# Patient Record
Sex: Female | Born: 1955 | Race: White | Hispanic: No | Marital: Single | Smoking: Former smoker
Health system: Southern US, Community
[De-identification: ages and names within clinical notes are randomized; demographics above are authoritative.]

## PROBLEM LIST (undated history)

## (undated) DIAGNOSIS — F909 Attention-deficit hyperactivity disorder, unspecified type: Secondary | ICD-10-CM

## (undated) DIAGNOSIS — C7931 Secondary malignant neoplasm of brain: Secondary | ICD-10-CM

## (undated) DIAGNOSIS — Z9221 Personal history of antineoplastic chemotherapy: Secondary | ICD-10-CM

## (undated) DIAGNOSIS — N6019 Diffuse cystic mastopathy of unspecified breast: Secondary | ICD-10-CM

## (undated) DIAGNOSIS — C50912 Malignant neoplasm of unspecified site of left female breast: Secondary | ICD-10-CM

## (undated) DIAGNOSIS — Z923 Personal history of irradiation: Secondary | ICD-10-CM

## (undated) DIAGNOSIS — C50919 Malignant neoplasm of unspecified site of unspecified female breast: Secondary | ICD-10-CM

## (undated) DIAGNOSIS — C801 Malignant (primary) neoplasm, unspecified: Secondary | ICD-10-CM

## (undated) HISTORY — PX: APPENDECTOMY: SHX54

## (undated) HISTORY — PX: ANKLE FRACTURE SURGERY: SHX122

## (undated) HISTORY — PX: ORIF WRIST FRACTURE: SHX2133

## (undated) HISTORY — DX: Diffuse cystic mastopathy of unspecified breast: N60.19

## (undated) HISTORY — PX: HUMERUS FRACTURE SURGERY: SHX670

## (undated) HISTORY — DX: Attention-deficit hyperactivity disorder, unspecified type: F90.9

---

## 1984-07-31 HISTORY — PX: TEMPOROMANDIBULAR JOINT SURGERY: SHX35

## 2001-04-30 ENCOUNTER — Other Ambulatory Visit: Admission: RE | Admit: 2001-04-30 | Discharge: 2001-04-30 | Payer: Self-pay | Admitting: Obstetrics and Gynecology

## 2001-12-06 ENCOUNTER — Encounter: Admission: RE | Admit: 2001-12-06 | Discharge: 2001-12-06 | Payer: Self-pay | Admitting: Obstetrics and Gynecology

## 2001-12-06 ENCOUNTER — Encounter: Payer: Self-pay | Admitting: Obstetrics and Gynecology

## 2002-09-30 ENCOUNTER — Encounter: Admission: RE | Admit: 2002-09-30 | Discharge: 2002-09-30 | Payer: Self-pay | Admitting: Surgery

## 2002-09-30 ENCOUNTER — Encounter: Payer: Self-pay | Admitting: Surgery

## 2003-10-08 ENCOUNTER — Ambulatory Visit (HOSPITAL_COMMUNITY): Admission: RE | Admit: 2003-10-08 | Discharge: 2003-10-08 | Payer: Self-pay | Admitting: Surgery

## 2003-10-08 ENCOUNTER — Ambulatory Visit (HOSPITAL_BASED_OUTPATIENT_CLINIC_OR_DEPARTMENT_OTHER): Admission: RE | Admit: 2003-10-08 | Discharge: 2003-10-08 | Payer: Self-pay | Admitting: Surgery

## 2003-10-08 ENCOUNTER — Encounter (INDEPENDENT_AMBULATORY_CARE_PROVIDER_SITE_OTHER): Payer: Self-pay | Admitting: *Deleted

## 2007-04-26 ENCOUNTER — Encounter: Admission: RE | Admit: 2007-04-26 | Discharge: 2007-04-26 | Payer: Self-pay | Admitting: Family Medicine

## 2007-05-01 ENCOUNTER — Encounter: Admission: RE | Admit: 2007-05-01 | Discharge: 2007-05-01 | Payer: Self-pay | Admitting: Family Medicine

## 2008-08-14 ENCOUNTER — Encounter: Admission: RE | Admit: 2008-08-14 | Discharge: 2008-08-14 | Payer: Self-pay | Admitting: Family Medicine

## 2010-08-21 ENCOUNTER — Encounter: Payer: Self-pay | Admitting: Family Medicine

## 2010-12-16 NOTE — Op Note (Signed)
Miranda Woods, Miranda Woods                        ACCOUNT NO.:  [id number]   MEDICAL RECORD NO.:  [id number]                   PATIENT TYPE:  AMB   LOCATION:  DSC                                  FACILITY:  [facility]   PHYSICIAN:  Currie Paris, M.D.           DATE OF BIRTH:  1955-08-27   DATE OF PROCEDURE:  10/08/2003  DATE OF DISCHARGE:                                 OPERATIVE REPORT   VISIT:  [id number].   PREOPERATIVE DIAGNOSIS:  Two pilar cysts, one right, one left occipital  region.   POSTOPERATIVE DIAGNOSIS:  Two pilar cysts, one right, one left occipital  region.   OPERATION:  Excision of two pilar cysts.   SURGEON:  Currie Paris, M.D.   ANESTHESIA:  Local.   CLINICAL HISTORY:  The patient is a 55 year old who has a gradually  enlarging and somewhat symptomatic pilar cysts that she desired to have  removed.  She was actually able to point out a third cyst, but that was  quite tiny and asymptomatic at the present time.   DESCRIPTION OF PROCEDURE:  The patient was seen in the minor procedure room  and the two cysts were identified and the surrounding skin shaved.  They  were both anesthetized with 1% Xylocaine with epi using about 5 mL for each.  Waited about ten minutes for that to take good effect.  The left side of the  cyst was approached first and it was a little bit more sizeable, about 2 cm,  so I did a little elliptical incision and then using sharp and blunt  dissection excised a cyst with the overlying skin.  The incision was closed  with some 3-0 Vicryl and then some Dermabond.   Attention was turned to the one on the left side.  That area was also  prepped with some Betadine.  This was smaller so I made a simple incision  over it and excised it and it came out very nicely.  That skin was also  closed with 3-0 Vicryl and Dermabond.   The third cyst that she pointed out was about 2 mm in size and barely  palpable and I felt it would not be appropriate  for removal at this point in  time.                                               Currie Paris, M.D.    CJS/MEDQ  D:  10/08/2003  T:  10/09/2003  Job:  [id number]

## 2011-04-28 ENCOUNTER — Other Ambulatory Visit: Payer: Self-pay | Admitting: Physician Assistant

## 2011-04-28 DIAGNOSIS — Z1231 Encounter for screening mammogram for malignant neoplasm of breast: Secondary | ICD-10-CM

## 2011-05-26 ENCOUNTER — Ambulatory Visit
Admission: RE | Admit: 2011-05-26 | Discharge: 2011-05-26 | Disposition: A | Discharge status: Home / Self Care | Payer: BC Managed Care – PPO | Source: Ambulatory Visit | Attending: Physician Assistant | Admitting: Physician Assistant

## 2011-05-26 DIAGNOSIS — Z1231 Encounter for screening mammogram for malignant neoplasm of breast: Secondary | ICD-10-CM

## 2011-08-13 ENCOUNTER — Encounter: Payer: Self-pay | Admitting: Physician Assistant

## 2011-08-13 DIAGNOSIS — F909 Attention-deficit hyperactivity disorder, unspecified type: Secondary | ICD-10-CM | POA: Insufficient documentation

## 2011-08-13 DIAGNOSIS — N6019 Diffuse cystic mastopathy of unspecified breast: Secondary | ICD-10-CM | POA: Insufficient documentation

## 2011-08-16 ENCOUNTER — Encounter: Payer: Self-pay | Admitting: Physician Assistant

## 2011-09-18 ENCOUNTER — Telehealth: Payer: Self-pay

## 2011-09-18 NOTE — Telephone Encounter (Signed)
Pt last seen for this 04/27/11. Last RFs 08/14/11

## 2011-09-18 NOTE — Telephone Encounter (Signed)
.  [facility]     PT REQUESTING CONCERTA,ADDERALL REFILLS    BEST PHONE [phone]

## 2011-09-19 MED ORDER — METHYLPHENIDATE HCL ER (OSM) 36 MG PO TBCR: 36.0000 mg | EXTENDED_RELEASE_TABLET | ORAL | Status: DC

## 2011-09-19 MED ORDER — AMPHETAMINE-DEXTROAMPHETAMINE 10 MG PO TABS: 10.0000 mg | ORAL_TABLET | Freq: Every day | ORAL | Status: DC

## 2011-09-19 NOTE — Telephone Encounter (Signed)
Written signed at TL desk -- needs ov next month

## 2011-09-19 NOTE — Telephone Encounter (Signed)
Rxs in drawer and pt notified and told needs OV. Pt states she has appt scheduled for March 28.

## 2011-09-28 ENCOUNTER — Ambulatory Visit (INDEPENDENT_AMBULATORY_CARE_PROVIDER_SITE_OTHER): Payer: BC Managed Care – PPO | Admitting: Physician Assistant

## 2011-09-28 ENCOUNTER — Encounter: Payer: Self-pay | Admitting: Physician Assistant

## 2011-09-28 VITALS — BP 127/73 | HR 61 | Temp 98.0°F | Resp 16 | Ht 63.75 in | Wt 119.8 lb

## 2011-09-28 DIAGNOSIS — Z1211 Encounter for screening for malignant neoplasm of colon: Secondary | ICD-10-CM

## 2011-09-28 DIAGNOSIS — F909 Attention-deficit hyperactivity disorder, unspecified type: Secondary | ICD-10-CM

## 2011-09-28 DIAGNOSIS — L989 Disorder of the skin and subcutaneous tissue, unspecified: Secondary | ICD-10-CM

## 2011-09-28 MED ORDER — METHYLPHENIDATE HCL ER (OSM) 36 MG PO TBCR: 36.0000 mg | EXTENDED_RELEASE_TABLET | ORAL | Status: DC

## 2011-09-28 MED ORDER — AMPHETAMINE-DEXTROAMPHETAMINE 10 MG PO TABS: 10.0000 mg | ORAL_TABLET | Freq: Two times a day (BID) | ORAL | Status: DC

## 2011-09-28 NOTE — Progress Notes (Signed)
  Subjective:    Patient ID: Miranda Woods, female    DOB: April 12, 1956, 56 y.o.   MRN: [id number]  HPI "Rash" on forehead, a spot, for a few years. Get flakey, then irritated, then calms, but never resolves.  Had a recent facial and the technician advised she have it evaluated.  Needs a refill of Adderall and Concerta.  Received them recently, attached, but the Adderall wasn't signed.  She's also stopped taking the Concerta and feels like she doing pretty well without it, only using the Adderall prn.  But, on the other hand, sometimes she really struggles to concentrate/focus and so isn't ready to give them up completely.  Did not have colonoscopy after CPE last year. CPE already scheduled for next month.  Review of Systems As above.    Objective:   Physical Exam Vital signs noted. Well-developed, well nourished WF who is awake, alert and oriented, in NAD. HEENT: [place]/AT, PERRL, EOMI.  Sclera and conjunctiva are clear.  EAC are patent, TMs are normal in appearance. Nasal mucosa is pink and moist. OP is clear. Neck: supple, non-tender, no lymphadenopathey, thyromegaly. Heart: RRR, no murmur Lungs: CTA Abdomen: normo-active bowel sounds, supple, non-tender, no mass or organomegaly. Extremities: no cyanosis, clubbing or edema. Skin: warm and dry without rash.  There is an irregular lesion on the right forehead.  Flat.  Very slight hyperpigmentation.  No raised edge.  Well moisturized, but likely rough.     Assessment & Plan:  1. Suspicious lesion-forehead. Refer to Dr. Wayland Denis for biopsy  2. ADHD Controlled with prn stimulant therapy. RF Concerta and Adderall  3. Health Maintenance Proceed with CPE with me next month. Refer for screening colonoscopy.

## 2011-09-28 NOTE — Patient Instructions (Signed)
If you have not heard about the referrals to Dr. Wayland Denis (plastic surgery) and the colonoscopy in 2 weeks, please contact our office.

## 2011-10-06 ENCOUNTER — Encounter: Payer: Self-pay | Admitting: Internal Medicine

## 2011-10-25 ENCOUNTER — Other Ambulatory Visit: Payer: Self-pay

## 2011-10-25 NOTE — Telephone Encounter (Signed)
Patient has an appt scheduled for a CPE with Chelle on Thursday May 2nd but will run out of meds before then. She would like to know if she could get her Concerta and Aderrall refilled.

## 2011-10-26 ENCOUNTER — Encounter: Payer: BC Managed Care – PPO | Admitting: Physician Assistant

## 2011-10-26 ENCOUNTER — Ambulatory Visit: Payer: Self-pay | Admitting: Physician Assistant

## 2011-11-02 ENCOUNTER — Telehealth: Payer: Self-pay | Admitting: *Deleted

## 2011-11-02 ENCOUNTER — Encounter: Payer: Self-pay | Admitting: *Deleted

## 2011-11-02 NOTE — Telephone Encounter (Signed)
Phone call made to patient and message left on voicemail for patient to call the office by 5pm today to reschedule previsit appointment missed today at 3:30pm. Left message stating if previsit appointment not rescheduled today then colonoscopy scheduled for 11-13-11 would be cancelled as well and patient would need to reschedule both appointments./TE

## 2011-11-13 ENCOUNTER — Encounter: Payer: BC Managed Care – PPO | Admitting: Internal Medicine

## 2011-11-14 ENCOUNTER — Encounter: Payer: Self-pay | Admitting: Internal Medicine

## 2011-11-30 ENCOUNTER — Ambulatory Visit (INDEPENDENT_AMBULATORY_CARE_PROVIDER_SITE_OTHER): Payer: BC Managed Care – PPO | Admitting: Physician Assistant

## 2011-11-30 ENCOUNTER — Encounter: Payer: Self-pay | Admitting: Physician Assistant

## 2011-11-30 VITALS — BP 119/80 | HR 57 | Temp 98.0°F | Resp 16 | Ht 63.0 in | Wt 120.0 lb

## 2011-11-30 DIAGNOSIS — F909 Attention-deficit hyperactivity disorder, unspecified type: Secondary | ICD-10-CM

## 2011-11-30 DIAGNOSIS — L659 Nonscarring hair loss, unspecified: Secondary | ICD-10-CM

## 2011-11-30 DIAGNOSIS — Z0000 Encounter for general adult medical examination without abnormal findings: Secondary | ICD-10-CM

## 2011-11-30 LAB — CBC WITH DIFFERENTIAL/PLATELET
Basophils Absolute: 0 10*3/uL (ref 0.0–0.1)
Basophils Relative: 1 % (ref 0–1)
Eosinophils Relative: 3 % (ref 0–5)
HCT: 39.7 % (ref 36.0–46.0)
Lymphocytes Relative: 34 % (ref 12–46)
MCHC: 33.5 g/dL (ref 30.0–36.0)
Monocytes Absolute: 0.5 10*3/uL (ref 0.1–1.0)
Neutro Abs: 2.5 10*3/uL (ref 1.7–7.7)
Platelets: 245 10*3/uL (ref 150–400)
RDW: 12.1 % (ref 11.5–15.5)
WBC: 4.8 10*3/uL (ref 4.0–10.5)

## 2011-11-30 LAB — POCT UA - MICROSCOPIC ONLY
Casts, Ur, LPF, POC: NEGATIVE
Mucus, UA: NEGATIVE
WBC, Ur, HPF, POC: NEGATIVE
Yeast, UA: NEGATIVE

## 2011-11-30 LAB — POCT URINALYSIS DIPSTICK
Bilirubin, UA: NEGATIVE
Blood, UA: NEGATIVE
Leukocytes, UA: NEGATIVE
Nitrite, UA: NEGATIVE
Protein, UA: NEGATIVE
Urobilinogen, UA: 0.2
pH, UA: 7

## 2011-11-30 LAB — COMPREHENSIVE METABOLIC PANEL
ALT: 12 U/L (ref 0–35)
AST: 17 U/L (ref 0–37)
Alkaline Phosphatase: 50 U/L (ref 39–117)
BUN: 13 mg/dL (ref 6–23)
Calcium: 9.4 mg/dL (ref 8.4–10.5)
Creat: 0.48 mg/dL — ABNORMAL LOW (ref 0.50–1.10)
Total Bilirubin: 0.3 mg/dL (ref 0.3–1.2)

## 2011-11-30 LAB — LIPID PANEL
HDL: 88 mg/dL (ref >39)
Total CHOL/HDL Ratio: 2.4 Ratio
VLDL: 21 mg/dL (ref 0–40)

## 2011-11-30 LAB — TSH: TSH: 0.932 u[IU]/mL (ref 0.350–4.500)

## 2011-11-30 MED ORDER — METHYLPHENIDATE HCL ER (OSM) 36 MG PO TBCR: 36.0000 mg | EXTENDED_RELEASE_TABLET | ORAL | Status: DC

## 2011-11-30 MED ORDER — AMPHETAMINE-DEXTROAMPHETAMINE 10 MG PO TABS: 10.0000 mg | ORAL_TABLET | Freq: Two times a day (BID) | ORAL | Status: DC

## 2011-11-30 NOTE — Progress Notes (Signed)
Subjective:    Patient ID: Miranda Woods, female    DOB: 12-06-1955, 56 y.o.   MRN: [id number]  HPI  Patient is here for her annual exam.  She is up to date on her mammogram.  She is scheduled for her first colonoscopy. She has not been taking her ADHD medications, but says that her job is increasingly stressful and would like a refill on her medications.   Patient is not fasting.  Review of Systems  Constitutional: Negative.   HENT: Positive for rhinorrhea (minor seasonal allergy sxs) and sneezing.   Eyes: Negative.   Respiratory: Negative.   Cardiovascular: Negative.   Gastrointestinal: Negative.   Genitourinary: Negative.   Musculoskeletal: Negative.   Neurological: Negative.   Hematological: Negative.   Psychiatric/Behavioral: Negative.          Objective:   Physical Exam  Constitutional: She is oriented to person, place, and time. She appears well-developed and well-nourished.  HENT:  Head: Normocephalic and atraumatic.  Right Ear: External ear normal.  Left Ear: External ear normal.  Nose: Nose normal.  Mouth/Throat: Oropharynx is clear and moist.       R/L tms normal  Eyes: Conjunctivae and EOM are normal. Pupils are equal, round, and reactive to light.  Fundoscopic exam:      The right eye shows no arteriolar narrowing, no AV nicking, no exudate, no hemorrhage and no papilledema. The right eye shows red reflex.      The left eye shows no arteriolar narrowing, no AV nicking, no exudate, no hemorrhage and no papilledema. The left eye shows red reflex. Neck: Normal range of motion. Neck supple. No tracheal deviation present. No thyromegaly present.  Cardiovascular: Normal rate, regular rhythm and normal heart sounds.   Pulmonary/Chest: Effort normal and breath sounds normal. No stridor. No respiratory distress.  Abdominal: Soft. Bowel sounds are normal. She exhibits no distension and no mass. There is no tenderness. There is no guarding.  Genitourinary: Vagina  normal and uterus normal. Guaiac negative stool. No vaginal discharge found.  Musculoskeletal: Normal range of motion. She exhibits no edema and no tenderness.  Lymphadenopathy:    She has no cervical adenopathy.  Neurological: She is alert and oriented to person, place, and time. She has normal reflexes. She displays normal reflexes. No cranial nerve deficit.  Skin: Skin is warm and dry. No rash noted. No erythema. No pallor.    Filed Vitals:   11/30/11 1515  BP: 119/80  Pulse: 57  Temp: 98 F (36.7 C)  Resp: 16   Results for orders placed in visit on 11/30/11  IFOBT (OCCULT BLOOD)      Component Value Range   IFOBT Negative    POCT UA - MICROSCOPIC ONLY      Component Value Range   WBC, Ur, HPF, POC neg     RBC, urine, microscopic neg     Bacteria, U Microscopic neg     Mucus, UA neg     Epithelial cells, urine per micros 0-2     Crystals, Ur, HPF, POC neg     Casts, Ur, LPF, POC neg     Yeast, UA neg    POCT URINALYSIS DIPSTICK      Component Value Range   Color, UA yellow     Clarity, UA clear     Glucose, UA neg     Bilirubin, UA neg     Ketones, UA neg     Spec Grav, UA 1.015  Blood, UA neg     pH, UA 7.0     Protein, UA neg     Urobilinogen, UA 0.2     Nitrite, UA neg     Leukocytes, UA Negative    COMPREHENSIVE METABOLIC PANEL      Component Value Range   Sodium 136  135 - 145 (mEq/L)   Potassium 4.0  3.5 - 5.3 (mEq/L)   Chloride 101  96 - 112 (mEq/L)   CO2 28  19 - 32 (mEq/L)   Glucose, Bld 94  70 - 99 (mg/dL)   BUN 13  6 - 23 (mg/dL)   Creat 7.82 (*) 9.56 - 1.10 (mg/dL)   Total Bilirubin 0.3  0.3 - 1.2 (mg/dL)   Alkaline Phosphatase 50  39 - 117 (U/L)   AST 17  0 - 37 (U/L)   ALT 12  0 - 35 (U/L)   Total Protein 6.7  6.0 - 8.3 (g/dL)   Albumin 4.5  3.5 - 5.2 (g/dL)   Calcium 9.4  8.4 - 21.3 (mg/dL)  LIPID PANEL      Component Value Range   Cholesterol 211 (*) 0 - 200 (mg/dL)   Triglycerides 086  <578 (mg/dL)   HDL 88  >46 (mg/dL)   Total  CHOL/HDL Ratio 2.4     VLDL 21  0 - 40 (mg/dL)   LDL Cholesterol 962 (*) 0 - 99 (mg/dL)  TSH      Component Value Range   TSH 0.932  0.350 - 4.500 (uIU/mL)  CBC WITH DIFFERENTIAL      Component Value Range   WBC 4.8  4.0 - 10.5 (K/uL)   RBC 4.24  3.87 - 5.11 (MIL/uL)   Hemoglobin 13.3  12.0 - 15.0 (g/dL)   HCT 95.2  84.1 - 32.4 (%)   MCV 93.6  78.0 - 100.0 (fL)   MCH 31.4  26.0 - 34.0 (pg)   MCHC 33.5  30.0 - 36.0 (g/dL)   RDW 40.1  02.7 - 25.3 (%)   Platelets 245  150 - 400 (K/uL)   Neutrophils Relative 53  43 - 77 (%)   Neutro Abs 2.5  1.7 - 7.7 (K/uL)   Lymphocytes Relative 34  12 - 46 (%)   Lymphs Abs 1.6  0.7 - 4.0 (K/uL)   Monocytes Relative 10  3 - 12 (%)   Monocytes Absolute 0.5  0.1 - 1.0 (K/uL)   Eosinophils Relative 3  0 - 5 (%)   Eosinophils Absolute 0.2  0.0 - 0.7 (K/uL)   Basophils Relative 1  0 - 1 (%)   Basophils Absolute 0.0  0.0 - 0.1 (K/uL)   Smear Review Criteria for review not met           Assessment & Plan:   1. ADHD (attention deficit hyperactivity disorder)  methylphenidate (CONCERTA) 36 MG CR tablet, amphetamine-dextroamphetamine (ADDERALL, 10MG ,) 10 MG tablet  2. Routine general medical examination at a health care facility  methylphenidate (CONCERTA) 36 MG CR tablet, amphetamine-dextroamphetamine (ADDERALL, 10MG ,) 10 MG tablet, IFOBT POC (occult bld, rslt in office), POCT UA - Microscopic Only, Urinalysis Dipstick, Comprehensive metabolic panel, Lipid panel, TSH, CBC with Differential  anticipatory guidance, see patient instructions RTC 1 year for annual or prn

## 2011-11-30 NOTE — Patient Instructions (Signed)

## 2011-12-01 ENCOUNTER — Encounter: Payer: Self-pay | Admitting: Physician Assistant

## 2011-12-01 NOTE — Progress Notes (Signed)
I have examined this patient along with the student and agree.  

## 2011-12-08 NOTE — Progress Notes (Signed)
Completed prior auth over [employer]. Received approval and faxed to pharmacy.

## 2012-01-01 ENCOUNTER — Telehealth: Payer: Self-pay

## 2012-01-01 DIAGNOSIS — Z0000 Encounter for general adult medical examination without abnormal findings: Secondary | ICD-10-CM

## 2012-01-01 DIAGNOSIS — F909 Attention-deficit hyperactivity disorder, unspecified type: Secondary | ICD-10-CM

## 2012-01-01 NOTE — Telephone Encounter (Signed)
PT REQUESTING ADDERALL/CONCERTA REFILLS   BEST PHONE [phone]

## 2012-01-02 MED ORDER — AMPHETAMINE-DEXTROAMPHETAMINE 10 MG PO TABS: 10.0000 mg | ORAL_TABLET | Freq: Two times a day (BID) | ORAL | Status: DC

## 2012-01-02 MED ORDER — METHYLPHENIDATE HCL ER (OSM) 36 MG PO TBCR: 36.0000 mg | EXTENDED_RELEASE_TABLET | ORAL | Status: DC

## 2012-01-02 NOTE — Telephone Encounter (Signed)
LMOM THAT RX IS READY FOR PICKUP 

## 2012-01-02 NOTE — Telephone Encounter (Signed)
Rx ready for pick up. Concerta sent to pharmacy.

## 2012-01-06 ENCOUNTER — Encounter: Payer: Self-pay | Admitting: Internal Medicine

## 2012-01-11 ENCOUNTER — Encounter: Payer: BC Managed Care – PPO | Admitting: Internal Medicine

## 2012-02-07 ENCOUNTER — Telehealth: Payer: Self-pay

## 2012-02-07 DIAGNOSIS — Z0000 Encounter for general adult medical examination without abnormal findings: Secondary | ICD-10-CM

## 2012-02-07 DIAGNOSIS — F909 Attention-deficit hyperactivity disorder, unspecified type: Secondary | ICD-10-CM

## 2012-02-07 NOTE — Telephone Encounter (Signed)
The patient called to request refill of Concerta 36 mg and Adderall 10mg  x 2 daily Rx's.  Please call [phone] once Rxs are ready for pick up.

## 2012-02-08 MED ORDER — METHYLPHENIDATE HCL ER (OSM) 36 MG PO TBCR: 36.0000 mg | EXTENDED_RELEASE_TABLET | ORAL | Status: DC

## 2012-02-08 MED ORDER — AMPHETAMINE-DEXTROAMPHETAMINE 10 MG PO TABS: 10.0000 mg | ORAL_TABLET | Freq: Two times a day (BID) | ORAL | Status: DC

## 2012-02-08 NOTE — Telephone Encounter (Signed)
Notified pt Rxs ready for p/up. 

## 2012-02-08 NOTE — Telephone Encounter (Signed)
Rx written and at TL desk for patient pickup.

## 2012-03-11 ENCOUNTER — Telehealth: Payer: Self-pay

## 2012-03-11 DIAGNOSIS — F909 Attention-deficit hyperactivity disorder, unspecified type: Secondary | ICD-10-CM

## 2012-03-11 DIAGNOSIS — Z0000 Encounter for general adult medical examination without abnormal findings: Secondary | ICD-10-CM

## 2012-03-11 MED ORDER — METHYLPHENIDATE HCL ER (OSM) 36 MG PO TBCR: 36.0000 mg | EXTENDED_RELEASE_TABLET | ORAL | Status: DC

## 2012-03-11 MED ORDER — AMPHETAMINE-DEXTROAMPHETAMINE 10 MG PO TABS: 10.0000 mg | ORAL_TABLET | Freq: Two times a day (BID) | ORAL | Status: DC

## 2012-03-11 NOTE — Telephone Encounter (Signed)
LMOM notifying patient rx in pick up drawer. 

## 2012-03-11 NOTE — Telephone Encounter (Signed)
At TL desk 

## 2012-03-11 NOTE — Telephone Encounter (Signed)
PT IN NEED OF HER ADDERALL AND 36MG S OF CONCERTA PLEASE CALL [phone] WHEN READY FOR P/U

## 2012-03-11 NOTE — Telephone Encounter (Signed)
See request OV 11/30/11 :  ADHD (attention deficit hyperactivity disorder)  methylphenidate (CONCERTA) 36 MG CR tablet, amphetamine-dextroamphetamine (ADDERALL, 10MG ,) 10 MG tablet   2.  Routine general medical examination at a health care facility  methylphenidate (CONCERTA) 36 MG CR tablet, amphetamine-dextroamphetamine (ADDERALL, 10MG ,) 10 MG tablet, IFOBT POC (occult bld, rslt in office), POCT UA - Microscopic Only, Urinalysis Dipstick, Comprehensive metabolic panel, Lipid panel, TSH, CBC with Differential   anticipatory guidance, see patient instructions  RTC 1 year for annual or prn JEFFERY,CHELLE, PA-C 12/01/2011 7:39 PM Signed

## 2012-12-06 ENCOUNTER — Telehealth: Payer: Self-pay

## 2012-12-06 NOTE — Telephone Encounter (Signed)
Pt has been under the care of chelle for ADHD meds, but  has been off of meds for 7 months. Pt is requesting to go back on ADHD meds and wants a generic form Please call pt to advise

## 2012-12-06 NOTE — Telephone Encounter (Signed)
We need to see her to prescribe meds, controlled substance. She was transferred to appt scheduling.

## 2013-02-03 ENCOUNTER — Ambulatory Visit (INDEPENDENT_AMBULATORY_CARE_PROVIDER_SITE_OTHER): Payer: PRIVATE HEALTH INSURANCE | Admitting: Physician Assistant

## 2013-02-03 VITALS — BP 118/76 | HR 60 | Temp 98.0°F | Resp 16 | Ht 64.0 in | Wt 132.4 lb

## 2013-02-03 DIAGNOSIS — F909 Attention-deficit hyperactivity disorder, unspecified type: Secondary | ICD-10-CM

## 2013-02-03 MED ORDER — AMPHETAMINE-DEXTROAMPHETAMINE 10 MG PO TABS: 10.0000 mg | ORAL_TABLET | Freq: Two times a day (BID) | ORAL | Status: DC

## 2013-02-03 NOTE — Progress Notes (Signed)
  Subjective:    Patient ID: Miranda Woods, female    DOB: [birth year], 57 y.o.   MRN: [id number]  HPI This 57 y.o. female presents for evaluation of ADHD.  Longstanding diagnosis-has used medication off and on over the past few years. Last prescription was given 11/2011.  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  She is very busy professionally. Having difficulty with focus, irritability, obsessive thoughts and shifting focus/concentration.  Has tried meditation and thought replacement.  States "I feel like I might pop." "Wired," "Really on edge," "out of my skin."  No feelings of depression, but some anxiety with overwhelming professional obligations.  No hot flashes (since about 3 months after cessation of menses), no vaginal dryness.  "I really feel good otherwise."  No mammogram this year.  Did not have colonoscopy last year as planned.  Currently has an [employer] that doesn't cover these items, though she intends to get better coverage when it's time to renew this fall/winter.   Review of Systems As above.    Objective:   Physical Exam Blood pressure 118/76, pulse 60, temperature 98 F (36.7 C), temperature source Oral, resp. rate 16, height 5\' 4"  (1.626 m), weight 132 lb 6.4 oz (60.056 kg), last menstrual period 11/28/2011, SpO2 98.00%. Body mass index is 22.72 kg/(m^2). Well-developed, well nourished WF who is awake, alert and oriented, in NAD. HEENT: [place]/AT, sclera and conjunctiva are clear.  Neck: supple, non-tender, no lymphadenopathy, thyromegaly. Heart: RRR, no murmur Lungs: normal effort, CTA Extremities: no cyanosis, clubbing or edema. Skin: warm and dry without rash. Psychologic: good mood and appropriate affect, normal speech and behavior.        Assessment & Plan:  ADHD (attention deficit hyperactivity disorder) - Plan: amphetamine-dextroamphetamine  (ADDERALL) 10 MG tablet  May call for Rx x 6 months if effective again at this dose.  If anxiety symptoms persist once attention issues resolved, or if develops depression, RTC.  Schedule mammogram and colonoscopy once her new insurance plan covers those items.  Fernande Bras, PA-C Physician Assistant-Certified [facility]

## 2013-03-06 ENCOUNTER — Telehealth: Payer: Self-pay

## 2013-03-06 DIAGNOSIS — F909 Attention-deficit hyperactivity disorder, unspecified type: Secondary | ICD-10-CM

## 2013-03-06 MED ORDER — AMPHETAMINE-DEXTROAMPHETAMINE 10 MG PO TABS: 10.0000 mg | ORAL_TABLET | Freq: Two times a day (BID) | ORAL | Status: AC

## 2013-03-06 NOTE — Telephone Encounter (Signed)
Pended please advise.  

## 2013-03-06 NOTE — Telephone Encounter (Signed)
Refill Request   amphetamine-dextroamphetamine (ADDERALL) 10 MG tablet   [phone]

## 2013-03-06 NOTE — Telephone Encounter (Signed)
Left message rx ready for p/u 

## 2013-03-06 NOTE — Telephone Encounter (Signed)
Rx printed.  Meds ordered this encounter  Medications  . amphetamine-dextroamphetamine (ADDERALL) 10 MG tablet    Sig: Take 1 tablet (10 mg total) by mouth 2 (two) times daily.    Dispense:  60 tablet    Refill:  0    Order Specific Question:  Supervising Provider    Answer:  DOOLITTLE, ROBERT P [[id number]]    

## 2015-12-30 ENCOUNTER — Ambulatory Visit
Admission: RE | Admit: 2015-12-30 | Discharge: 2015-12-30 | Disposition: A | Discharge status: Home / Self Care | Payer: PRIVATE HEALTH INSURANCE | Source: Ambulatory Visit | Attending: Family Medicine | Admitting: Family Medicine

## 2015-12-30 ENCOUNTER — Other Ambulatory Visit: Payer: Self-pay | Admitting: Family Medicine

## 2015-12-30 DIAGNOSIS — M79672 Pain in left foot: Secondary | ICD-10-CM

## 2016-08-14 ENCOUNTER — Other Ambulatory Visit: Payer: Self-pay | Admitting: Family Medicine

## 2016-08-14 DIAGNOSIS — Z1231 Encounter for screening mammogram for malignant neoplasm of breast: Secondary | ICD-10-CM

## 2016-09-04 ENCOUNTER — Ambulatory Visit
Admission: RE | Admit: 2016-09-04 | Discharge: 2016-09-04 | Disposition: A | Discharge status: Home / Self Care | Payer: No Typology Code available for payment source | Source: Ambulatory Visit | Attending: Family Medicine | Admitting: Family Medicine

## 2016-09-04 DIAGNOSIS — Z1231 Encounter for screening mammogram for malignant neoplasm of breast: Secondary | ICD-10-CM

## 2016-09-05 ENCOUNTER — Other Ambulatory Visit: Payer: Self-pay | Admitting: Family Medicine

## 2016-09-05 DIAGNOSIS — N644 Mastodynia: Secondary | ICD-10-CM

## 2016-09-05 DIAGNOSIS — N63 Unspecified lump in unspecified breast: Secondary | ICD-10-CM

## 2016-09-12 ENCOUNTER — Ambulatory Visit
Admission: RE | Admit: 2016-09-12 | Discharge: 2016-09-12 | Disposition: A | Discharge status: Home / Self Care | Payer: No Typology Code available for payment source | Source: Ambulatory Visit | Attending: Family Medicine | Admitting: Family Medicine

## 2016-09-12 ENCOUNTER — Other Ambulatory Visit: Payer: Self-pay | Admitting: Family Medicine

## 2016-09-12 DIAGNOSIS — N6320 Unspecified lump in the left breast, unspecified quadrant: Secondary | ICD-10-CM

## 2016-09-12 DIAGNOSIS — N63 Unspecified lump in unspecified breast: Secondary | ICD-10-CM

## 2016-09-12 DIAGNOSIS — N644 Mastodynia: Secondary | ICD-10-CM

## 2016-09-14 ENCOUNTER — Other Ambulatory Visit: Payer: Self-pay | Admitting: Family Medicine

## 2016-09-14 ENCOUNTER — Ambulatory Visit
Admission: RE | Admit: 2016-09-14 | Discharge: 2016-09-14 | Disposition: A | Discharge status: Home / Self Care | Payer: No Typology Code available for payment source | Source: Ambulatory Visit | Attending: Family Medicine | Admitting: Family Medicine

## 2016-09-14 DIAGNOSIS — N644 Mastodynia: Secondary | ICD-10-CM

## 2016-09-14 DIAGNOSIS — N6320 Unspecified lump in the left breast, unspecified quadrant: Secondary | ICD-10-CM

## 2016-09-14 HISTORY — PX: BREAST BIOPSY: SHX20

## 2016-09-15 ENCOUNTER — Ambulatory Visit
Admission: RE | Admit: 2016-09-15 | Discharge: 2016-09-15 | Disposition: A | Discharge status: Home / Self Care | Payer: No Typology Code available for payment source | Source: Ambulatory Visit | Attending: Family Medicine | Admitting: Family Medicine

## 2016-09-15 DIAGNOSIS — N644 Mastodynia: Secondary | ICD-10-CM

## 2016-09-15 DIAGNOSIS — N6320 Unspecified lump in the left breast, unspecified quadrant: Secondary | ICD-10-CM

## 2016-09-18 ENCOUNTER — Encounter: Payer: Self-pay | Admitting: General Surgery

## 2016-09-18 ENCOUNTER — Telehealth: Payer: Self-pay | Admitting: *Deleted

## 2016-09-18 NOTE — Telephone Encounter (Signed)
Confirmed [facility] for 09/20/16 at 1215 .  Instructions and contact information given.

## 2016-09-18 NOTE — Telephone Encounter (Signed)
Left vm regarding [place] on 09/20/16. Contact information provided for return call.

## 2016-09-19 ENCOUNTER — Other Ambulatory Visit: Payer: Self-pay | Admitting: *Deleted

## 2016-09-19 DIAGNOSIS — C50212 Malignant neoplasm of upper-inner quadrant of left female breast: Secondary | ICD-10-CM

## 2016-09-19 DIAGNOSIS — Z170 Estrogen receptor positive status [ER+]: Principal | ICD-10-CM

## 2016-09-20 ENCOUNTER — Encounter: Payer: Self-pay | Admitting: Physical Therapy

## 2016-09-20 ENCOUNTER — Ambulatory Visit (HOSPITAL_BASED_OUTPATIENT_CLINIC_OR_DEPARTMENT_OTHER): Payer: No Typology Code available for payment source | Admitting: Hematology and Oncology

## 2016-09-20 ENCOUNTER — Encounter: Payer: Self-pay | Admitting: Hematology and Oncology

## 2016-09-20 ENCOUNTER — Ambulatory Visit: Payer: No Typology Code available for payment source | Attending: General Surgery | Admitting: Physical Therapy

## 2016-09-20 ENCOUNTER — Other Ambulatory Visit (HOSPITAL_BASED_OUTPATIENT_CLINIC_OR_DEPARTMENT_OTHER): Payer: No Typology Code available for payment source

## 2016-09-20 ENCOUNTER — Ambulatory Visit
Admission: RE | Admit: 2016-09-20 | Discharge: 2016-09-20 | Disposition: A | Discharge status: Home / Self Care | Payer: No Typology Code available for payment source | Source: Ambulatory Visit | Attending: Radiation Oncology | Admitting: Radiation Oncology

## 2016-09-20 DIAGNOSIS — C50812 Malignant neoplasm of overlapping sites of left female breast: Secondary | ICD-10-CM

## 2016-09-20 DIAGNOSIS — Z170 Estrogen receptor positive status [ER+]: Secondary | ICD-10-CM

## 2016-09-20 DIAGNOSIS — C50212 Malignant neoplasm of upper-inner quadrant of left female breast: Secondary | ICD-10-CM

## 2016-09-20 DIAGNOSIS — R293 Abnormal posture: Secondary | ICD-10-CM

## 2016-09-20 LAB — COMPREHENSIVE METABOLIC PANEL
ALT: 14 U/L (ref 0–55)
AST: 16 U/L (ref 5–34)
Albumin: 4 g/dL (ref 3.5–5.0)
Alkaline Phosphatase: 60 U/L (ref 40–150)
Anion Gap: 8 mEq/L (ref 3–11)
BUN: 9.9 mg/dL (ref 7.0–26.0)
CO2: 27 meq/L (ref 22–29)
CREATININE: 0.7 mg/dL (ref 0.6–1.1)
Calcium: 9.7 mg/dL (ref 8.4–10.4)
Chloride: 106 mEq/L (ref 98–109)
EGFR: >90 mL/min/{1.73_m2} (ref >90)
GLUCOSE: 110 mg/dL (ref 70–140)
Potassium: 4.5 mEq/L (ref 3.5–5.1)
Sodium: 140 mEq/L (ref 136–145)
Total Bilirubin: 0.47 mg/dL (ref 0.20–1.20)
Total Protein: 6.8 g/dL (ref 6.4–8.3)

## 2016-09-20 LAB — CBC WITH DIFFERENTIAL/PLATELET
BASO%: 0.7 % (ref 0.0–2.0)
Basophils Absolute: 0 10*3/uL (ref 0.0–0.1)
EOS%: 1.7 % (ref 0.0–7.0)
Eosinophils Absolute: 0.1 10*3/uL (ref 0.0–0.5)
HEMATOCRIT: 38.7 % (ref 34.8–46.6)
HGB: 13.2 g/dL (ref 11.6–15.9)
LYMPH#: 1.4 10*3/uL (ref 0.9–3.3)
LYMPH%: 29.9 % (ref 14.0–49.7)
MCH: 30.8 pg (ref 25.1–34.0)
MCHC: 34.1 g/dL (ref 31.5–36.0)
MCV: 90.2 fL (ref 79.5–101.0)
MONO#: 0.4 10*3/uL (ref 0.1–0.9)
MONO%: 9.6 % (ref 0.0–14.0)
NEUT#: 2.7 10*3/uL (ref 1.5–6.5)
NEUT%: 58.1 % (ref 38.4–76.8)
Platelets: 275 10*3/uL (ref 145–400)
RBC: 4.29 10*6/uL (ref 3.70–5.45)
RDW: 12 % (ref 11.2–14.5)
WBC: 4.6 10*3/uL (ref 3.9–10.3)

## 2016-09-20 MED ORDER — LETROZOLE 2.5 MG PO TABS: 2.5000 mg | ORAL_TABLET | Freq: Every day | ORAL | 3 refills | Status: DC

## 2016-09-20 NOTE — Progress Notes (Signed)
Nutrition Assessment  Reason for Assessment:  Pt seen in Breast Clinic  ASSESSMENT:   60 year old female with new diagnosis of left breast mass. Past medical history of ADHD, fibrocystic breast disease   Medications:  reviewed  Labs: reviewed  Anthropometrics:   Height: 65.5 inches Weight: 126 pounds BMI: 20.8   NUTRITION DIAGNOSIS: Food and nutrition related knowledge deficit related to new diagnosis of breast cancer as evidenced by no prior need for nutrition related information.  INTERVENTION:   Discussed and provided packet of information regarding nutritional tips for breast cancer patients.  Questions answered.  Teachback method used.  Contact information provided and patient knows to contact me with questions/concerns.    MONITORING, EVALUATION, and GOAL: Pt will consume a healthy plant based diet to maintain lean body mass throughout treatment.   Pascal Stiggers B. Zenia Resides, [place], [place] Registered Dietitian [phone] (pager)

## 2016-09-20 NOTE — Progress Notes (Signed)
[facility] CONSULT NOTE  Patient Care Team: [name], MD as Consulting Physician (General Surgery) Nicholas Lose, MD as Consulting Physician (Hematology and Oncology) Eppie Gibson, MD as Attending Physician (Radiation Oncology)  CHIEF COMPLAINTS/PURPOSE OF CONSULTATION:  Newly diagnosed breast cancer  HISTORY OF PRESENTING ILLNESS:  Miranda Woods 61 y.o. female is here because of recent diagnosis of left breast cancer. Patient felt a lump in the left breast for the past 2 months. She subsequently underwent mammogram that revealed a 4.7 cm lesion in the left breast 9:00 position. Axilla was negative. 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 She was presented at multidisciplinary tumor board and she is here today to discuss treatment plan. She is here today accompanied by her friend.  I reviewed her records extensively and collaborated the history with the patient.  SUMMARY OF ONCOLOGIC HISTORY:   Malignant neoplasm of upper-inner quadrant of left breast in female, estrogen receptor positive ([place])   09/14/2016 Initial Diagnosis    Palpable left breast mass for 2 months, ultrasound at 9:00: 4.7 x 3.6 x 3.3 cm, axilla negative: U/S Bx: Grade 2 IDC ER 95%, PR 50%, HER-2 positive ratio 6.17, Ki-67 20%, T2 N0 stage IIA clinical stage      MEDICAL HISTORY:  Past Medical History:  Diagnosis Date  . ADHD (attention deficit hyperactivity disorder)   . Fibrocystic breast disease     SURGICAL HISTORY: Past Surgical History:  Procedure Laterality Date  . APPENDECTOMY    . ORIF WRIST FRACTURE      SOCIAL HISTORY: Social History   Social History  . Marital status: Single    Spouse name: [name]  . Number of children: 0  . Years of education: N/A   Occupational History  . counselor    Social History Main Topics  . Smoking status: Former Smoker    Types: Cigarettes  . Smokeless  tobacco: Not on file  . Alcohol use 0.0 oz/week  . Drug use: Unknown  . Sexual activity: Yes    Partners: Male    Birth control/ protection: Post-menopausal   Other Topics Concern  . Not on file   Social History Narrative  . No narrative on file    FAMILY HISTORY: Family History  Problem Relation Age of Onset  . Heart disease Father   . Hypertension Father   . Thyroid disease Mother     ALLERGIES:  is allergic to codeine.  MEDICATIONS:  Current Outpatient Prescriptions  Medication Sig Dispense Refill  . hydrOXYzine (ATARAX/VISTARIL) 10 MG tablet Take 5 mg by mouth as needed.    . methylphenidate (RITALIN) 5 MG tablet Take 5 mg by mouth 2 (two) times daily.    Marland Kitchen letrozole (FEMARA) 2.5 MG tablet Take 1 tablet (2.5 mg total) by mouth daily. 90 tablet 3   No current facility-administered medications for this visit.     REVIEW OF SYSTEMS:   Constitutional: Denies fevers, chills or abnormal night sweats Eyes: Denies blurriness of vision, double vision or watery eyes Ears, nose, mouth, throat, and face: Denies mucositis or sore throat Respiratory: Denies cough, dyspnea or wheezes Cardiovascular: Denies palpitation, chest discomfort or lower extremity swelling Gastrointestinal:  Denies nausea, heartburn or change in bowel habits Skin: Denies abnormal skin rashes Lymphatics: Denies new lymphadenopathy or easy bruising Neurological:Denies numbness, tingling or new weaknesses Behavioral/Psych: Mood is stable, no new changes  Breast: Palpable lump in the left breast All  other systems were reviewed with the patient and are negative.  PHYSICAL EXAMINATION: ECOG PERFORMANCE STATUS: 0 - Asymptomatic  Vitals:   09/20/16 1235  BP: (!) 149/78  Pulse: 98  Resp: 18  Temp: 97.5 F (36.4 C)   Filed Weights   09/20/16 1235  Weight: 126 lb 14.4 oz (57.6 kg)    GENERAL:alert, no distress and comfortable SKIN: skin color, texture, turgor are normal, no rashes or significant  lesions EYES: normal, conjunctiva are pink and non-injected, sclera clear OROPHARYNX:no exudate, no erythema and lips, buccal mucosa, and tongue normal  NECK: supple, thyroid normal size, non-tender, without nodularity LYMPH:  no palpable lymphadenopathy in the cervical, axillary or inguinal LUNGS: clear to auscultation and percussion with normal breathing effort HEART: regular rate & rhythm and no murmurs and no lower extremity edema ABDOMEN:abdomen soft, non-tender and normal bowel sounds Musculoskeletal:no cyanosis of digits and no clubbing  PSYCH: alert & oriented x 3 with fluent speech NEURO: no focal motor/sensory deficits BREAST: palpable nodule in left breast. No palpable axillary or supraclavicular lymphadenopathy (exam performed in the presence of a chaperone)   LABORATORY DATA:  I have reviewed the data as listed Lab Results  Component Value Date   WBC 4.6 09/20/2016   HGB 13.2 09/20/2016   HCT 38.7 09/20/2016   MCV 90.2 09/20/2016   PLT 275 09/20/2016   Lab Results  Component Value Date   NA 140 09/20/2016   K 4.5 09/20/2016   CL 101 11/30/2011   CO2 27 09/20/2016    RADIOGRAPHIC STUDIES: I have personally reviewed the radiological reports and agreed with the findings in the report.  ASSESSMENT AND PLAN:  Malignant neoplasm of upper-inner quadrant of left breast in female, estrogen receptor positive ([place]) Palpable left breast mass for 2 months, ultrasound at 9:00: 4.7 x 3.6 x 3.3 cm, axilla negative: U/S Bx: Grade 2 IDC ER 95%, PR 50%, HER-2 positive ratio 6.17, Ki-67 20%, T2 N0 stage IIA clinical stage  Pathology and radiology counseling: Discussed with the patient, the details of pathology including the type of breast cancer,the clinical staging, the significance of ER, PR and HER-2/neu receptors and the implications for treatment. After reviewing the pathology in detail, we proceeded to discuss the different treatment options between surgery, radiation,  chemotherapy, antiestrogen therapies.  Recommendation based on multidisciplinary tumor board: 1. Neoadjuvant chemotherapy with TCH Perjeta 6 cycles followed by Herceptin and Perjeta maintenance for 1 year 2. Followed by breast conserving surgery if possible with sentinel lymph node study 3. Followed by adjuvant radiation therapy if patient had lumpectomy 4. Followed by adjuvant antiestrogen therapy  Chemotherapy Counseling: I discussed the risks and benefits of chemotherapy including the risks of nausea/ vomiting, risk of infection from low WBC count, fatigue due to chemo or anemia, bruising or bleeding due to low platelets, mouth sores, loss/ change in taste and decreased appetite. Liver and kidney function will be monitored through out chemotherapy as abnormalities in liver and kidney function may be a side effect of treatment. Cardiac dysfunction due to Herceptin and Perjeta were discussed in detail. Risk of permanent bone marrow dysfunction due to chemo were also discussed.  Plan: 1. Port placement 2. Echocardiogram 3. Chemotherapy class 4. Breast MRI  After listening to the entire discussion regarding chemotherapy, patient is not very keen on undergoing treatment with chemotherapy drugs. They had many questions about natural options as well as vitamin C infusions etc. She has already made an appointment to see Dr. Alvan Dame at [facility]. She also would like [place] to refer her to [facility] for second opinion. I made a recommendation for her to see Dr.Anders. Because of the delay in initiating therapy, I recommended that she start antiestrogen therapy with letrozole. I sent a prescription for letrozole 2.5 mg daily. I discussed recent benefits of antiestrogen therapy and she understands these risks and is willing to proceed.  All questions were answered.Return to clinic after she made a decision on where and what she wants to do with her treatment.     Rulon Eisenmenger, MD 09/20/16

## 2016-09-20 NOTE — Therapy (Signed)
[place], [place], [address] Phone: [phone]   Fax:  [phone]  Physical Therapy Evaluation  Patient Details  Name: Miranda Woods MRN: [id number] Date of Birth: 05/14/1956 Referring Provider: Dr. [name]  Encounter Date: 09/20/2016      PT End of Session - 09/20/16 1652    Visit Number 1   Number of Visits 1   PT Start Time 2409   PT Stop Time 7353  Also saw pt from 1512-1530 for a total of 36 minutes   PT Time Calculation (min) 18 min   Activity Tolerance Patient tolerated treatment well   Behavior During Therapy [facility] for tasks assessed/performed      Past Medical History:  Diagnosis Date  . ADHD (attention deficit hyperactivity disorder)   . Fibrocystic breast disease     Past Surgical History:  Procedure Laterality Date  . APPENDECTOMY    . ORIF WRIST FRACTURE      There were no vitals filed for this visit.       Subjective Assessment - 09/20/16 1641    Subjective Patient reports she is here today to be seen by her medical team for her newly diagnosed left breast cancer.   Pertinent History Patient was diagnosed on 09/12/16 with left Triple positive breast cancer. It measures 4.7 cm and is located in the upper inner quadrant of her left breast. It is grade 2 invasive ductal carcinoma.   Patient Stated Goals Reduce lymphedema risk and learn post op shoulder ROM HEP   Currently in Pain? No/denies            [facility] PT Assessment - 09/20/16 0001      Assessment   Medical Diagnosis Left breast cancer   Referring Provider Dr. [name]   Onset Date/Surgical Date 09/12/16   Hand Dominance Right   Prior Therapy none     Precautions   Precautions Other (comment)   Precaution Comments active cancer     Restrictions   Weight Bearing Restrictions No     Balance Screen   Has the patient fallen in the past 6 months No   Has the patient had a decrease in activity  level because of a fear of falling?  No   Is the patient reluctant to leave their home because of a fear of falling?  No     Home Environment   Living Environment Private residence   Living Arrangements Children  59 y.o. 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 [place] She walks or runs daily 30-45 minutes and does some yoga     Cognition   Overall Cognitive Status Within Functional Limits for tasks assessed     Posture/Postural Control   Posture/Postural Control Postural limitations   Postural Limitations Forward head;Rounded Shoulders     ROM / Strength   AROM / PROM / Strength AROM;Strength     AROM   AROM Assessment Site Shoulder;Cervical   Right/Left Shoulder Right;Left   Right Shoulder Extension 53 Degrees   Right Shoulder Flexion 162 Degrees   Right Shoulder ABduction 180 Degrees   Right Shoulder Internal Rotation 67 Degrees   Right Shoulder External Rotation 90 Degrees   Left Shoulder Extension 59 Degrees   Left Shoulder Flexion 160 Degrees  Left Shoulder ABduction 180 Degrees   Left Shoulder Internal Rotation 80 Degrees   Left Shoulder External Rotation 90 Degrees   Cervical Flexion WNL   Cervical Extension WNL   Cervical - Right Side Bend WNL   Cervical - Left Side Bend WNL   Cervical - Right Rotation WNL   Cervical - Left Rotation WNL     Strength   Overall Strength Within functional limits for tasks performed           LYMPHEDEMA/ONCOLOGY QUESTIONNAIRE - 09/20/16 1651      Type   Cancer Type Left breast cancer     Lymphedema Assessments   Lymphedema Assessments Upper extremities     Right Upper Extremity Lymphedema   10 cm Proximal to Olecranon Process 24.8 cm   Olecranon Process 21.7 cm   10 cm Proximal to Ulnar Styloid Process 18.5 cm   Just Proximal to Ulnar  Styloid Process 13.4 cm   Across Hand at [employer] 16.6 cm   At [place] of 2nd Digit 5.5 cm     Left Upper Extremity Lymphedema   10 cm Proximal to Olecranon Process 24.8 cm   Olecranon Process 21.8 cm   10 cm Proximal to Ulnar Styloid Process 17.9 cm   Just Proximal to Ulnar Styloid Process 13.6 cm   Across Hand at [employer] 16.5 cm   At [place] of 2nd Digit 5.3 cm           Patient was instructed today in a home exercise program today for post op shoulder range of motion. These included active assist shoulder flexion in sitting, scapular retraction, wall walking with shoulder abduction, and hands behind head external rotation.  She was encouraged to do these twice a day, holding 3 seconds and repeating 5 times when permitted by her physician.          PT Education - 09/20/16 1652    Education provided Yes   Education Details Lymphedema risk reduction and post op shoulder ROM HEP   Person(s) Educated Patient   Methods Explanation;Demonstration;Handout   Comprehension Returned demonstration;Verbalized understanding              Breast Clinic Goals - 09/20/16 1656      Patient will be able to verbalize understanding of pertinent lymphedema risk reduction practices relevant to her diagnosis specifically related to skin care.   Time 1   Period Days   Status Achieved     Patient will be able to return demonstrate and/or verbalize understanding of the post-op home exercise program related to regaining shoulder range of motion.   Time 1   Period Days   Status Achieved     Patient will be able to verbalize understanding of the importance of attending the postoperative After Breast Cancer Class for further lymphedema risk reduction education and therapeutic exercise.   Time 1   Period Days   Status Achieved              Plan - 09/20/16 1653    Clinical Impression Statement Patient was diagnosed on 09/12/16 with left Triple positive breast cancer. It  measures 4.7 cm and is located in the upper inner quadrant of her left breast. It is grade 2 invasive ductal carcinoma. Her multidisciplinary medical team met prior to her assessments to determine a recommended treatment plan. She is planning to get a second opinion but will likely have an MRI, neoadjuvant chemotherapy followed by a left lumpectomy and sentinel node  biopsy, radiaiton and anti-estrogen therapy. She may benefit from post op PT to regain shoulder ROM and reduce lymphedema risk. Due to her lack of comorbidities, her eval is of low complexity.   Rehab Potential Excellent   Clinical Impairments Affecting Rehab Potential None   PT Frequency One time visit   PT Treatment/Interventions Therapeutic exercise;Patient/family education   PT Next Visit Plan Will f/u after surgery to determine PT needs   PT Home Exercise Plan post op shoulder ROM HEP and lymphedema risk reduction   Consulted and Agree with Plan of Care Patient;Family member/caregiver   Family Member Consulted Anise Salvo      Patient will benefit from skilled therapeutic intervention in order to improve the following deficits and impairments:  Postural dysfunction, Decreased knowledge of precautions, Pain, Impaired UE functional use, Decreased range of motion  Visit Diagnosis: Carcinoma of upper-inner quadrant of left breast in female, estrogen receptor positive ([place]) - Plan: PT plan of care cert/re-cert  Abnormal posture - Plan: PT plan of care cert/re-cert   Patient will follow up at outpatient cancer rehab if needed following surgery.  If the patient requires physical therapy at that time, a specific plan will be dictated and sent to the referring physician for approval. The patient was educated today on appropriate basic range of motion exercises to begin post operatively and the importance of attending the After Breast Cancer class following surgery.  Patient was educated today on lymphedema risk reduction practices as it  pertains to recommendations that will benefit the patient immediately following surgery.  She verbalized good understanding.  No additional physical therapy is indicated at this time.      Problem List Patient Active Problem List   Diagnosis Date Noted  . Malignant neoplasm of upper-inner quadrant of left breast in female, estrogen receptor positive ([place]) 09/19/2016  . ADHD (attention deficit hyperactivity disorder)   . Fibrocystic breast disease     Annia Friendly, [place] 09/20/16 4:58 PM  [place], [place], [address] Phone: [phone]   Fax:  [phone]  Name: Miranda Woods MRN: [id number] Date of Birth: 08-08-1955

## 2016-09-20 NOTE — Assessment & Plan Note (Signed)
Palpable left breast mass for 2 months, ultrasound at 9:00: 4.7 x 3.6 x 3.3 cm, axilla negative: U/S Bx: Grade 2 IDC ER 95%, PR 50%, HER-2 positive ratio 6.17, Ki-67 20%, T2 N0 stage IIA clinical stage  Pathology and radiology counseling: Discussed with the patient, the details of pathology including the type of breast cancer,the clinical staging, the significance of ER, PR and HER-2/neu receptors and the implications for treatment. After reviewing the pathology in detail, we proceeded to discuss the different treatment options between surgery, radiation, chemotherapy, antiestrogen therapies.  Recommendation based on multidisciplinary tumor board: 1. Neoadjuvant chemotherapy with TCH Perjeta 6 cycles followed by Herceptin maintenance for 1 year 2. Followed by breast conserving surgery if possible with sentinel lymph node study 3. Followed by adjuvant radiation therapy if patient had lumpectomy 4. Followed by adjuvant antiestrogen therapy  Chemotherapy Counseling: I discussed the risks and benefits of chemotherapy including the risks of nausea/ vomiting, risk of infection from low WBC count, fatigue due to chemo or anemia, bruising or bleeding due to low platelets, mouth sores, loss/ change in taste and decreased appetite. Liver and kidney function will be monitored through out chemotherapy as abnormalities in liver and kidney function may be a side effect of treatment. Cardiac dysfunction due to Herceptin and Perjeta were discussed in detail. Risk of permanent bone marrow dysfunction due to chemo were also discussed.  Plan: 1. Port placement 2. Echocardiogram 3. Chemotherapy class 4. Breast MRI 5. CT chest abdomen pelvis and bone scan for staging  Return to clinic in 2 weeks to start chemotherapy.

## 2016-09-20 NOTE — Progress Notes (Signed)
Radiation Oncology         [phone] ________________________________  Initial outpatient Consultation  Name: Miranda Woods MRN: [id number]  Date: 09/20/2016  DOB: [birth year]  CC:No primary care provider on file.  No ref. provider found   REFERRING PHYSICIAN: [name] MD  DIAGNOSIS:    ICD-9-CM ICD-10-CM   1. 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 female who presented with a palpable left breast abnormality for 2 months. Accordingly she underwent diagnostic mammogram and an ultrasound of the left breast on 09/12/2016. These scans showed a highly suspicious left breast mass at the 9 o'clock position 1 cm from the nipple measuring 4.7 x 3.3 x 3.6 cm.  Biopsy of the left breast at the 11 o'clock position on 09/14/2016 showed invasive ductal carcinoma with characteristics as described above in the diagnosis. Also noted was fibroadenomatoid change with calcifications.   The patient is accompanied by a friend. She wears glasses for vision. She reports mild breast pain. She also reports recent rash on chest.  She is in her USOH otherwise.  She plans to seek a second opinion at a university soon.   PREVIOUS RADIATION THERAPY: No  PAST MEDICAL HISTORY:  has a past medical history of ADHD (attention deficit hyperactivity disorder) and Fibrocystic breast disease.    PAST SURGICAL HISTORY: Past Surgical History:  Procedure Laterality Date  . APPENDECTOMY    . ORIF WRIST FRACTURE      FAMILY HISTORY: family history includes Heart disease in her father; Hypertension in her father; Thyroid disease in her mother.  SOCIAL HISTORY:  reports that she has quit smoking. Her smoking use  included Cigarettes. She does not have any smokeless tobacco history on file. She reports that she drinks alcohol.  ALLERGIES: Codeine  MEDICATIONS:  Current Outpatient Prescriptions  Medication Sig Dispense Refill  . hydrOXYzine (ATARAX/VISTARIL) 10 MG tablet Take 5 mg by mouth as needed.    Marland Kitchen letrozole (FEMARA) 2.5 MG tablet Take 1 tablet (2.5 mg total) by mouth daily. 90 tablet 3  . methylphenidate (RITALIN) 5 MG tablet Take 5 mg by mouth 2 (two) times daily.     No current facility-administered medications for this encounter.    Gynecologic History  Age at first menstrual period? 15.5  Are you still having periods? No Approximate date of last period? 06/2012  If you are still having periods: Are your periods regular? N/A  If you no longer have periods: Have you used hormone replacement? No  If YES, for how long? N/A When did you stop? N/A Obstetric History:  How many children have you carried to term? 0 Your age at first live birth? N/A  Pregnant now or trying to get pregnant? No  Have you used birth control pills or hormone shots for contraception? Yes  If so, for how long (or approximate dates)? Briefly 61 yo - 59 yo, 59 yo - 61 yo  Would you be interested in learning more about the options to preserve fertility? N/A Health Maintenance:  Have you ever had a colonoscopy? No If yes, date? N/A  Have you ever had a bone density? No If yes, date? N/A  Date of  your last PAP smear? 6/16 Date of your FIRST mammogram? Don't remember.  REVIEW OF SYSTEMS: A 10+ POINT REVIEW OF SYSTEMS WAS OBTAINED including neurology, dermatology, psychiatry, cardiac, respiratory, lymph, extremities, GI, GU, Musculoskeletal, constitutional, breasts, reproductive, HEENT.  All pertinent positives are noted in the HPI.  All others are negative.   PHYSICAL EXAM:  Vitals with BMI 09/20/2016  Height 5' 5.5"  Weight 126 lbs 14 oz  BMI 41.3  Systolic 244  Diastolic 78  Pulse 98  Respirations 18   General:  Alert and oriented, in no acute distress HEENT: Head is normocephalic. Extraocular movements are intact. Oropharynx is clear. Neck: Neck is supple, no palpable cervical or supraclavicular lymphadenopathy. Heart: Regular in rate and rhythm with no murmurs, rubs, or gallops. Chest: Clear to auscultation bilaterally, with no rhonchi, wheezes, or rales. Abdomen: Soft, nontender, nondistended, with no rigidity or guarding. Extremities: No cyanosis or edema. Lymphatics: see Neck Exam Skin: No concerning lesions. Musculoskeletal: symmetric strength and muscle tone throughout. Neurologic: Cranial nerves II through XII are grossly intact. No obvious focalities. Speech is fluent. Coordination is intact. Psychiatric: Judgment and insight are intact. Affect is appropriate. Breasts: Around the 9 o'clock position of the left breast there is a palpable mass approximately 4 to 5 cm in greatest dimension. There are no lymph nodes appreciated in the left axilla. No palpable masses appreciated in the right breast or right axilla.   ECOG = 0  0 - Asymptomatic (Fully active, able to carry on all predisease activities without restriction)  1 - Symptomatic but completely ambulatory (Restricted in physically strenuous activity but ambulatory and able to carry out work of a light or sedentary nature. For example, light housework, office work)  2 - Symptomatic, <50% in bed during the day (Ambulatory and capable of all self care but unable to carry out any work activities. Up and about more than 50% of waking hours)  3 - Symptomatic, >50% in bed, but not bedbound (Capable of only limited self-care, confined to bed or chair 50% or more of waking hours)  4 - Bedbound (Completely disabled. Cannot carry on any self-care. Totally confined to bed or chair)  5 - Death   Eustace Pen MM, Creech RH, Tormey DC, et al. [phone]). "Toxicity and response criteria of the [facility]". [place] Oncol. 5 (6):  649-55   LABORATORY DATA:  Lab Results  Component Value Date   WBC 4.6 09/20/2016   HGB 13.2 09/20/2016   HCT 38.7 09/20/2016   MCV 90.2 09/20/2016   PLT 275 09/20/2016   CMP     Component Value Date/Time   NA 140 09/20/2016 1215   K 4.5 09/20/2016 1215   CL 101 11/30/2011 1649   CO2 27 09/20/2016 1215   GLUCOSE 110 09/20/2016 1215   BUN 9.9 09/20/2016 1215   CREATININE 0.7 09/20/2016 1215   CALCIUM 9.7 09/20/2016 1215   PROT 6.8 09/20/2016 1215   ALBUMIN 4.0 09/20/2016 1215   AST 16 09/20/2016 1215   ALT 14 09/20/2016 1215   ALKPHOS 60 09/20/2016 1215   BILITOT 0.47 09/20/2016 1215         RADIOGRAPHY: Mm Radiologist Eval And Mgmt  Result Date: 09/15/2016 EXAM: ESTABLISHED PATIENT OFFICE VISIT - LEVEL II CHIEF COMPLAINT: Palpable left breast mass, post ultrasound-guided biopsy of palpable 4.7 cm mass in the left breast at the 9 o'clock position. HISTORY OF PRESENT ILLNESS: The patient returns for pathology results and wound check following ultrasound-guided biopsy of  a palpable 4.7 cm mass in the inner left breast at the 9 o'clock position. EXAM: Biopsy site: Clean, dry, and intact. Steri-Strips overlie small skin incision. Mild bruising at the biopsy site. PATHOLOGY: Invasive ductal carcinoma, grade 2. This is concordant with the imaging findings. ASSESSMENT AND PLAN: ASSESSMENT AND PLAN All the patient's questions were answered by myself and the Breast Center nurse navigator, Terie Purser. She is aware of the results and demonstrates understanding. The patient is scheduled to be seen in Multidisciplinary Clinic 09/20/2016. Electronically Signed   By: Everlean Alstrom M.D.   On: 09/15/2016 15:03   US Breast Ltd Uni Left Inc Axilla  Result Date: 09/12/2016 CLINICAL DATA:  61 year old female with a left breast palpable abnormality. EXAM: 2D DIGITAL DIAGNOSTIC BILATERAL MAMMOGRAM WITH CAD AND ADJUNCT TOMO LEFT BREAST ULTRASOUND COMPARISON:  Previous exam(s). ACR Breast Density  Category d: The breast tissue is extremely dense, which lowers the sensitivity of mammography. FINDINGS: There is a spiculated mass in the inner left breast containing coarse heterogeneous calcifications measuring approximately 4.3 cm. No suspicious masses or calcifications are seen in the right breast. Mammographic images were processed with CAD. Physical examination of the left breast reveals a large firm involving the inner 2 retroareolar left breast. Targeted ultrasound of the left breast was performed demonstrating an irregular hypoechoic mass containing calcifications at 9 o'clock 1 cm from the nipple measuring 4.7 x 3.3 x 3.6 cm. This corresponds with the mass seen at mammography. No definite lymphadenopathy seen in the left axilla. IMPRESSION: Highly suspicious left breast mass measuring up to 4.7 cm. RECOMMENDATION: Ultrasound-guided biopsy of the mass in the left breast is recommended. This is being scheduled for the patient. I have discussed the findings and recommendations with the patient. Results were also provided in writing at the conclusion of the visit. If applicable, a reminder letter will be sent to the patient regarding the next appointment. BI-RADS CATEGORY  5: Highly suggestive of malignancy. Electronically Signed   By: Everlean Alstrom M.D.   On: 09/12/2016 16:19   Mm Diag Breast Tomo Bilateral  Result Date: 09/12/2016 CLINICAL DATA:  61 year old female with a left breast palpable abnormality. EXAM: 2D DIGITAL DIAGNOSTIC BILATERAL MAMMOGRAM WITH CAD AND ADJUNCT TOMO LEFT BREAST ULTRASOUND COMPARISON:  Previous exam(s). ACR Breast Density Category d: The breast tissue is extremely dense, which lowers the sensitivity of mammography. FINDINGS: There is a spiculated mass in the inner left breast containing coarse heterogeneous calcifications measuring approximately 4.3 cm. No suspicious masses or calcifications are seen in the right breast. Mammographic images were processed with CAD.  Physical examination of the left breast reveals a large firm involving the inner 2 retroareolar left breast. Targeted ultrasound of the left breast was performed demonstrating an irregular hypoechoic mass containing calcifications at 9 o'clock 1 cm from the nipple measuring 4.7 x 3.3 x 3.6 cm. This corresponds with the mass seen at mammography. No definite lymphadenopathy seen in the left axilla. IMPRESSION: Highly suspicious left breast mass measuring up to 4.7 cm. RECOMMENDATION: Ultrasound-guided biopsy of the mass in the left breast is recommended. This is being scheduled for the patient. I have discussed the findings and recommendations with the patient. Results were also provided in writing at the conclusion of the visit. If applicable, a reminder letter will be sent to the patient regarding the next appointment. BI-RADS CATEGORY  5: Highly suggestive of malignancy. Electronically Signed   By: Everlean Alstrom M.D.   On: 09/12/2016 16:19   Mm Clip  Placement Left  Result Date: 09/14/2016 CLINICAL DATA:  Post ultrasound-guided biopsy of a highly suspicious mass in the left breast at 9 o'clock 1 cm from the nipple. EXAM: DIAGNOSTIC LEFT MAMMOGRAM POST ULTRASOUND BIOPSY COMPARISON:  Previous exam(s). FINDINGS: Mammographic images were obtained following ultrasound guided biopsy of the mass in the left breast at the 9 o'clock position periareolar. A ribbon shaped biopsy marking clip is present located in the inferior medial portion of the left breast mass. IMPRESSION: Ribbon shaped biopsy marking clip at site of biopsied mass in the left breast at the 9 o'clock position. Final Assessment: Post Procedure Mammograms for Marker Placement Electronically Signed   By: Everlean Alstrom M.D.   On: 09/14/2016 13:44   [place] Lt Breast Bx W Loc Dev 1st Lesion Img Bx Spec US Guide  Result Date: 09/14/2016 CLINICAL DATA:  61 year old female with a suspicious mass in the left breast at the 9 o'clock position. EXAM:  ULTRASOUND GUIDED LEFT BREAST CORE NEEDLE BIOPSY COMPARISON:  Previous exam(s). FINDINGS: I met with the patient and we discussed the procedure of ultrasound-guided biopsy, including benefits and alternatives. We discussed the high likelihood of a successful procedure. We discussed the risks of the procedure, including infection, bleeding, tissue injury, clip migration, and inadequate sampling. Informed written consent was given. The usual time-out protocol was performed immediately prior to the procedure. Using sterile technique and 1% Lidocaine as local anesthetic, under direct ultrasound visualization, a 12 gauge spring-loaded device was used to perform biopsy of the mass in the left breast at the 9 o'clock position using a medial to lateral approach. At the conclusion of the procedure a ribbon shaped tissue marker clip was deployed into the biopsy cavity. Follow up 2 view mammogram was performed and dictated separately. IMPRESSION: Ultrasound guided biopsy of the mass in the left breast at the 9 o'clock position. No apparent complications. Electronically Signed   By: Everlean Alstrom M.D.   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 Medical oncology has recommended neo-adjuvant chemotherapy with the ultimate goal of breast conservation surgery.    It was a pleasure meeting the patient today. We discussed the risks, benefits, and side effects of radiotherapy. I recommend radiotherapy to the left breast to reduce her risk of locoregional recurrence by 2/3.  We discussed that radiation would take approximately 6 weeks to complete and that I would give the patient a few weeks to heal following surgery before starting treatment planning.  We spoke about acute effects including skin irritation and fatigue as well as much less common late effects including internal organ injury or  irritation. We spoke about the latest technology (ie DIBH technique in our clinic) that is used to minimize the risk of late effects (ie to the heart) for patients undergoing radiotherapy to the breast or chest wall. No guarantees of treatment were given. The patient is enthusiastic about proceeding with treatment. I look forward to participating in the patient's care.  I will await her referral back to me for postoperative follow-up and eventual CT simulation/treatment planning.  She understands that radiation treatment would be recommended with breast conservation therapy but we also discussed the standard indications for post mastectomy radiation - she may or may not need RT if she undergoes mastectomy.  The patient plans to go for second opinion at a local university. I encouraged  her to proceed with that as I think it will provide helpful information and peace of mind.    __________________________________________   Eppie Gibson, MD   This document serves as a record of services personally performed by Eppie Gibson, MD. It was created on her behalf by Arlyce Harman, a trained medical scribe. The creation of this record is based on the scribe's personal observations and the provider's statements to them. This document has been checked and approved by the attending provider.

## 2016-09-20 NOTE — Patient Instructions (Signed)

## 2016-09-21 ENCOUNTER — Encounter: Payer: Self-pay | Admitting: Hematology and Oncology

## 2016-09-21 ENCOUNTER — Telehealth: Payer: Self-pay | Admitting: Hematology and Oncology

## 2016-09-21 ENCOUNTER — Encounter: Payer: Self-pay | Admitting: General Practice

## 2016-09-21 NOTE — Progress Notes (Signed)
Called patient referred by Varney Biles to introduce myself as Estate manager/land agent and to discuss financial questions or concerns. Patient was very Patent attorney.  Asked patient about her insurance plan and she states from what she was told over the phone, her plan does not cover cancer treatment. Asked patient if she has any detailed paperwork from her plan that shows her benefits. She states she is sure she does but did not have in front of her. Advised patient once her treatment plan has been established, there is a team that would reach out to her insurance to find out what is and isn't covered and depending on the treatment, notify someone if she may possibly need assistance. Discussed with her that some foundations are disease-based whereas others may be drug-based.  Gave patient my name and number should she have any additional financial questions or concerns and to contact me once a decision has been made and her plan established. Patient thanked me.

## 2016-09-21 NOTE — Telephone Encounter (Signed)
Faxed records to Dr. Erline Levine office. Dr. Wetzel Bjornstad nurse will review the records and call pt with appt.

## 2016-09-21 NOTE — Progress Notes (Signed)
[place] Psychosocial Distress Screening Spiritual Care  Met with Miranda Woods in [facility] to introduce [place] team/resources, reviewing distress screen per protocol.  The patient scored a 8 on the Psychosocial Distress Thermometer which indicates severe distress. Also assessed for distress and other psychosocial needs.   ONCBCN DISTRESS SCREENING 09/21/2016  Screening Type Initial Screening  Distress experienced in past week (1-10) 8  Practical problem type Housing;Insurance;Work/school;Transportation;Food  Family Problem type Other (comment)  Emotional problem type Nervousness/Anxiety;Adjusting to illness  Information Concerns Type Lack of info about diagnosis;Lack of info about treatment;Lack of info about complementary therapy choices  Referral to support programs Yes  Other will refer to LCSW, Estate manager/land agent, Counseling Intern, Proofreader Guide per pt request   Spoke extensively with Miranda Woods, who verbalized several key emotional and practical concerns:  --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  She worries about maintaining practice and continuity of client care during chemo.  This contributes to financial and emotional distress.  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  Pt's commitment and integrity are important to her.  She also acknowledges that the relationship can be a source of stress, which is concerning when she will need extra rest and support during tx.  --She is very worried about whether her health insurance will cover enough expenses for her to be able to receive tx.  Strengths include supportive family, ability to ask for help, variety of coping tools, and spiritual practices (yoga, meditation, etc).  We talked in detail about [facility] team and resources.  Referring to [employer], Estate manager/land agent, Counseling Intern, and [employer].  Will also  follow for continued emotional and spiritual support.  Follow up needed: Yes.     [place], [place], [facility] Pager [phone] Voicemail [phone]

## 2016-09-21 NOTE — Progress Notes (Signed)
[place] Counseling Note  Called patient per supervisor, Lattie Haw [place]// Mdiv, [facility], to inform pt of counseling support opportunities. Pt did not answer, counselor LVM. Will f/u with pt on 2/23.  Ayesha Rumpf, [place] [facility] [phone]

## 2016-09-22 ENCOUNTER — Telehealth: Payer: Self-pay | Admitting: *Deleted

## 2016-09-22 ENCOUNTER — Other Ambulatory Visit: Payer: Self-pay

## 2016-09-22 NOTE — Telephone Encounter (Signed)
Spoke with patient from [facility].  She states she is 99% sure she will receive treatment here.  She is going to keep the appointment at [facility] on 2/28 but would like to cancel referral to [facility].  She would like to go ahead and get things scheduled as to not delay things.  Informed her I would let Dr. Lindi Adie and we will get her scheduled for echo, chemo class and port placement.  I will follow up with her next week.

## 2016-09-25 ENCOUNTER — Other Ambulatory Visit: Payer: Self-pay | Admitting: Hematology and Oncology

## 2016-09-26 ENCOUNTER — Ambulatory Visit (HOSPITAL_COMMUNITY): Payer: No Typology Code available for payment source

## 2016-09-26 ENCOUNTER — Other Ambulatory Visit: Payer: Self-pay | Admitting: General Surgery

## 2016-09-26 ENCOUNTER — Encounter: Payer: Self-pay | Admitting: *Deleted

## 2016-09-26 NOTE — Progress Notes (Signed)
[place] Work  Holiday representative received referral from [employer] for support and financial resources/questions.  CSW called patient at home.  Patients voicemail was full and CSW was unable to leave a message.  CSW team will continue attempting to contact patient.     Johnnye Lana, MSW, LCSW, OSW-C Clinical Social Worker [facility] [phone]

## 2016-09-27 ENCOUNTER — Ambulatory Visit (HOSPITAL_COMMUNITY)
Admission: RE | Admit: 2016-09-27 | Discharge: 2016-09-27 | Disposition: A | Discharge status: Home / Self Care | Payer: No Typology Code available for payment source | Source: Ambulatory Visit | Attending: Hematology and Oncology | Admitting: Hematology and Oncology

## 2016-09-27 ENCOUNTER — Telehealth (HOSPITAL_COMMUNITY): Payer: Self-pay | Admitting: Vascular Surgery

## 2016-09-27 DIAGNOSIS — Z9889 Other specified postprocedural states: Secondary | ICD-10-CM | POA: Diagnosis not present

## 2016-09-27 DIAGNOSIS — C50212 Malignant neoplasm of upper-inner quadrant of left female breast: Secondary | ICD-10-CM | POA: Diagnosis present

## 2016-09-27 DIAGNOSIS — Z170 Estrogen receptor positive status [ER+]: Secondary | ICD-10-CM | POA: Insufficient documentation

## 2016-09-27 MED ORDER — GADOBENATE DIMEGLUMINE 529 MG/ML IV SOLN
15.0000 mL | Freq: Once | INTRAVENOUS | Status: AC | PRN
  Administered 2016-09-27: 11 mL via INTRAVENOUS

## 2016-09-27 NOTE — Telephone Encounter (Signed)
Called pt to make NP pt ASAP [facility] APPT W/ [facility] W/ ECHO. PT VM was full will try back later, I did schedule pt for 3/8 18 @ 3 echo  and 4 w/ [facility], sent her NP instructions in mail

## 2016-09-28 ENCOUNTER — Ambulatory Visit (HOSPITAL_BASED_OUTPATIENT_CLINIC_OR_DEPARTMENT_OTHER): Payer: No Typology Code available for payment source | Admitting: Hematology and Oncology

## 2016-09-28 ENCOUNTER — Encounter: Payer: Self-pay | Admitting: Hematology and Oncology

## 2016-09-28 ENCOUNTER — Other Ambulatory Visit: Payer: Self-pay | Admitting: Hematology and Oncology

## 2016-09-28 ENCOUNTER — Other Ambulatory Visit: Payer: Self-pay | Admitting: *Deleted

## 2016-09-28 ENCOUNTER — Other Ambulatory Visit: Payer: No Typology Code available for payment source

## 2016-09-28 ENCOUNTER — Encounter: Payer: Self-pay | Admitting: *Deleted

## 2016-09-28 ENCOUNTER — Telehealth: Payer: Self-pay | Admitting: *Deleted

## 2016-09-28 VITALS — BP 155/87 | HR 80 | Temp 97.4°F | Resp 18 | Ht 65.5 in | Wt 128.5 lb

## 2016-09-28 DIAGNOSIS — Z170 Estrogen receptor positive status [ER+]: Principal | ICD-10-CM

## 2016-09-28 DIAGNOSIS — R599 Enlarged lymph nodes, unspecified: Secondary | ICD-10-CM

## 2016-09-28 DIAGNOSIS — C50212 Malignant neoplasm of upper-inner quadrant of left female breast: Secondary | ICD-10-CM

## 2016-09-28 DIAGNOSIS — N63 Unspecified lump in unspecified breast: Secondary | ICD-10-CM

## 2016-09-28 MED ORDER — ONDANSETRON HCL 8 MG PO TABS: 8.0000 mg | ORAL_TABLET | Freq: Two times a day (BID) | ORAL | 1 refills | Status: DC | PRN

## 2016-09-28 MED ORDER — LORAZEPAM 0.5 MG PO TABS: 0.5000 mg | ORAL_TABLET | Freq: Four times a day (QID) | ORAL | 0 refills | Status: DC | PRN

## 2016-09-28 MED ORDER — DEXAMETHASONE 4 MG PO TABS: 2.0000 mg | ORAL_TABLET | Freq: Every day | ORAL | 0 refills | Status: DC

## 2016-09-28 MED ORDER — LIDOCAINE-PRILOCAINE 2.5-2.5 % EX CREA: TOPICAL_CREAM | CUTANEOUS | 3 refills | Status: DC

## 2016-09-28 MED ORDER — PROCHLORPERAZINE MALEATE 10 MG PO TABS: 10.0000 mg | ORAL_TABLET | Freq: Four times a day (QID) | ORAL | 1 refills | Status: DC | PRN

## 2016-09-28 NOTE — Progress Notes (Signed)
Patient Care Team: [name], MD as Consulting Physician (General Surgery) Nicholas Lose, MD as Consulting Physician (Hematology and Oncology) Eppie Gibson, MD as Attending Physician (Radiation Oncology)  DIAGNOSIS:  Encounter Diagnosis  Name Primary?  . Malignant neoplasm of upper-inner quadrant of left breast in female, estrogen receptor positive ([place])     SUMMARY OF ONCOLOGIC HISTORY:   Malignant neoplasm of upper-inner quadrant of left breast in female, estrogen receptor positive ([place])   09/14/2016 Initial Diagnosis    Palpable left breast mass for 2 months, ultrasound at 9:00: 4.7 x 3.6 x 3.3 cm, axilla negative: U/S Bx: Grade 2 IDC ER 95%, PR 50%, HER-2 positive ratio 6.17, Ki-67 20%, T2 N0 stage IIA clinical stage      09/27/2016 Breast MRI    Left breast: 3.5 cm irregular necrotic mass; left axillary lymph node 1.2 cm; right breast 0.6 cm mass at 6:00 position 0.5 cm mass right breast lower inner quadrant        CHIEF COMPLIANT: Follow-up after recent breast MRI and after getting second opinions.  INTERVAL HISTORY: Miranda Woods is a 61 year old with above-mentioned history of newly diagnosed left breast cancer who was seen at the multidisciplinary breast clinic last Wednesday and is here today after undergoing a breast MRI in after having been through [facility] for a second opinion. She is still emotionally very labile. Having significant difficulty in accepting the diagnosis and the treatment plan which was to pursue neoadjuvant chemotherapy. She is accompanied by her friend who have been through breast cancer treatment to help her with these complex decisions.  REVIEW OF SYSTEMS:   Constitutional: Denies fevers, chills or abnormal weight loss Eyes: Denies blurriness of vision Ears, nose, mouth, throat, and face: Denies mucositis or sore throat Respiratory: Denies cough, dyspnea or wheezes Cardiovascular: Denies palpitation, chest discomfort Gastrointestinal:   Denies nausea, heartburn or change in bowel habits Skin: Denies abnormal skin rashes Lymphatics: Denies new lymphadenopathy or easy bruising Neurological:Denies numbness, tingling or new weaknesses Behavioral/Psych: Mood is stable, no new changes , patient is anxious Extremities: No lower extremity edema  All other systems were reviewed with the patient and are negative.  I have reviewed the past medical history, past surgical history, social history and family history with the patient and they are unchanged from previous note.  ALLERGIES:  is allergic to codeine.  MEDICATIONS:  Current Outpatient Prescriptions  Medication Sig Dispense Refill  . hydrOXYzine (ATARAX/VISTARIL) 10 MG tablet Take 5 mg by mouth as needed.    Marland Kitchen letrozole (FEMARA) 2.5 MG tablet Take 1 tablet (2.5 mg total) by mouth daily. 90 tablet 3  . methylphenidate (RITALIN) 5 MG tablet Take 5 mg by mouth 2 (two) times daily.     No current facility-administered medications for this visit.     PHYSICAL EXAMINATION: ECOG PERFORMANCE STATUS: 1 - Symptomatic but completely ambulatory  Vitals:   09/28/16 1420  BP: (!) 155/87  Pulse: 80  Resp: 18  Temp: 97.4 F (36.3 C)   Filed Weights   09/28/16 1420  Weight: 128 lb 8 oz (58.3 kg)    GENERAL:alert, no distress and comfortable SKIN: skin color, texture, turgor are normal, no rashes or significant lesions EYES: normal, Conjunctiva are pink and non-injected, sclera clear OROPHARYNX:no exudate, no erythema and lips, buccal mucosa, and tongue normal  NECK: supple, thyroid normal size, non-tender, without nodularity LYMPH:  no palpable lymphadenopathy in the cervical, axillary or inguinal LUNGS: clear to auscultation and percussion with normal breathing  effort HEART: regular rate & rhythm and no murmurs and no lower extremity edema ABDOMEN:abdomen soft, non-tender and normal bowel sounds MUSCULOSKELETAL:no cyanosis of digits and no clubbing  NEURO: alert &  oriented x 3 with fluent speech, no focal motor/sensory deficits EXTREMITIES: No lower extremity edema   LABORATORY DATA:  I have reviewed the data as listed   Chemistry      Component Value Date/Time   NA 140 09/20/2016 1215   K 4.5 09/20/2016 1215   CL 101 11/30/2011 1649   CO2 27 09/20/2016 1215   BUN 9.9 09/20/2016 1215   CREATININE 0.7 09/20/2016 1215      Component Value Date/Time   CALCIUM 9.7 09/20/2016 1215   ALKPHOS 60 09/20/2016 1215   AST 16 09/20/2016 1215   ALT 14 09/20/2016 1215   BILITOT 0.47 09/20/2016 1215       Lab Results  Component Value Date   WBC 4.6 09/20/2016   HGB 13.2 09/20/2016   HCT 38.7 09/20/2016   MCV 90.2 09/20/2016   PLT 275 09/20/2016   NEUTROABS 2.7 09/20/2016    ASSESSMENT & PLAN:  Malignant neoplasm of upper-inner quadrant of left breast in female, estrogen receptor positive ([facility]) Palpable left breast mass for 2 months, ultrasound at 9:00: 4.7 x 3.6 x 3.3 cm, axilla negative: U/S Bx: Grade 2 IDC ER 95%, PR 50%, HER-2 positive ratio 6.17, Ki-67 20%,   MRI breast 09/28/2016: Left breast: 3.5 cm irregular necrotic mass; left axillary lymph node 1.2 cm; right breast 0.6 cm mass at 6:00 position 0.5 cm mass right breast lower inner quadrant  T2 N0 stage IIA clinical stage (could be stage IIB if the lymph node in the left axilla is positive)  Recommendation based on multidisciplinary tumor board: 1. Neoadjuvant chemotherapy with TCH Perjeta 6 cycles followed by Herceptin and Perjeta maintenance for 1 year 2. Followed by breast conserving surgery if possible with sentinel lymph node study 3. Followed by adjuvant radiation therapy if patient had lumpectomy 4. Followed by adjuvant antiestrogen therapy  Plan: Patient will need MRI guided biopsies of the right breast as well as an ultrasound-guided biopsy of the left axillary lymph node. I reviewed the MRI results with the patient and discussed the importance of getting his biopsies before  doing chemotherapy.  We will plan to start chemotherapy on 10/11/2016. Patient would like to switch her surgeon to Dr. Donne Hazel.  I spent 25 minutes talking to the patient of which more than half was spent in counseling and coordination of care.  No orders of the defined types were placed in this encounter.  The patient has a good understanding of the overall plan. she agrees with it. she will call with any problems that may develop before the next visit here.   Rulon Eisenmenger, MD 09/28/16

## 2016-09-28 NOTE — Telephone Encounter (Signed)
Received call from patient stating she went for her 2nd opinion at [facility] and is requesting another opinion from another MD at [facility].  She also would like to come in and discuss with Dr. Lindi Adie.  Appt made for today 215.    Dr. Lindi Adie reviewed her MRI and she needs additional biopsies.  These were scheduled and given to the patient.  We will get her an appointment with another [facility] MD.

## 2016-09-28 NOTE — Assessment & Plan Note (Signed)
Palpable left breast mass for 2 months, ultrasound at 9:00: 4.7 x 3.6 x 3.3 cm, axilla negative: U/S Bx: Grade 2 IDC ER 95%, PR 50%, HER-2 positive ratio 6.17, Ki-67 20%,   MRI breast 09/28/2016: Left breast: 3.5 cm irregular necrotic mass; left axillary lymph node 1.2 cm; right breast 0.6 cm mass at 6:00 position 0.5 cm mass right breast lower inner quadrant  T2 N0 stage IIA clinical stage (could be stage IIB if the lymph node in the left axilla is positive)  Recommendation based on multidisciplinary tumor board: 1. Neoadjuvant chemotherapy with TCH Perjeta 6 cycles followed by Herceptin and Perjeta maintenance for 1 year 2. Followed by breast conserving surgery if possible with sentinel lymph node study 3. Followed by adjuvant radiation therapy if patient had lumpectomy 4. Followed by adjuvant antiestrogen therapy

## 2016-09-28 NOTE — Progress Notes (Signed)
START ON PATHWAY REGIMEN - Breast   Docetaxel  + Carboplatin + Trastuzumab + Pertuzumab (TCHP) q21 Days:   A cycle is every 21 days:     Pertuzumab        Dose Mod: None     Pertuzumab        Dose Mod: None     Trastuzumab        Dose Mod: None     Trastuzumab        Dose Mod: None     Carboplatin        Dose Mod: None     Docetaxel        Dose Mod: None  **Always confirm dose/schedule in your pharmacy ordering system**    Trastuzumab (Maintenance - NO Loading Dose):   A cycle is every 21 days:     Trastuzumab        Dose Mod: None  **Always confirm dose/schedule in your pharmacy ordering system**    Patient Characteristics: Neoadjuvant Chemotherapy, HER2/neu Positive, ER Positive AJCC Stage Grouping: IIB Current Disease Status: No Distant Mets or Local Recurrence AJCC M Stage: 0 ER Status: Positive (+) AJCC N Stage: 1 AJCC T Stage: 2 HER2/neu: Positive (+) PR Status: Positive (+)  Intent of Therapy: Curative Intent, Discussed with Patient

## 2016-10-03 ENCOUNTER — Ambulatory Visit
Admission: RE | Admit: 2016-10-03 | Discharge: 2016-10-03 | Disposition: A | Discharge status: Home / Self Care | Payer: No Typology Code available for payment source | Source: Ambulatory Visit | Attending: Hematology and Oncology | Admitting: Hematology and Oncology

## 2016-10-03 ENCOUNTER — Other Ambulatory Visit (HOSPITAL_COMMUNITY): Payer: No Typology Code available for payment source

## 2016-10-03 ENCOUNTER — Other Ambulatory Visit: Payer: No Typology Code available for payment source

## 2016-10-03 DIAGNOSIS — C50212 Malignant neoplasm of upper-inner quadrant of left female breast: Secondary | ICD-10-CM

## 2016-10-03 DIAGNOSIS — Z170 Estrogen receptor positive status [ER+]: Principal | ICD-10-CM

## 2016-10-03 DIAGNOSIS — R599 Enlarged lymph nodes, unspecified: Secondary | ICD-10-CM

## 2016-10-03 DIAGNOSIS — N63 Unspecified lump in unspecified breast: Secondary | ICD-10-CM

## 2016-10-04 ENCOUNTER — Other Ambulatory Visit: Payer: Self-pay | Admitting: *Deleted

## 2016-10-04 ENCOUNTER — Encounter: Payer: Self-pay | Admitting: *Deleted

## 2016-10-04 ENCOUNTER — Other Ambulatory Visit: Payer: No Typology Code available for payment source

## 2016-10-04 ENCOUNTER — Ambulatory Visit (HOSPITAL_COMMUNITY)
Admission: RE | Admit: 2016-10-04 | Discharge: 2016-10-04 | Disposition: A | Discharge status: Home / Self Care | Payer: No Typology Code available for payment source | Source: Ambulatory Visit | Attending: Hematology and Oncology | Admitting: Hematology and Oncology

## 2016-10-04 DIAGNOSIS — I501 Left ventricular failure: Secondary | ICD-10-CM | POA: Diagnosis not present

## 2016-10-04 DIAGNOSIS — Z170 Estrogen receptor positive status [ER+]: Principal | ICD-10-CM

## 2016-10-04 DIAGNOSIS — C50212 Malignant neoplasm of upper-inner quadrant of left female breast: Secondary | ICD-10-CM

## 2016-10-04 DIAGNOSIS — I360 Nonrheumatic tricuspid (valve) stenosis: Secondary | ICD-10-CM | POA: Diagnosis not present

## 2016-10-04 DIAGNOSIS — I361 Nonrheumatic tricuspid (valve) insufficiency: Secondary | ICD-10-CM | POA: Insufficient documentation

## 2016-10-04 NOTE — Progress Notes (Signed)
  Echocardiogram 2D Echocardiogram has been performed.  Darlina Sicilian M 10/04/2016, 9:54 AM

## 2016-10-05 ENCOUNTER — Ambulatory Visit (HOSPITAL_COMMUNITY): Payer: No Typology Code available for payment source

## 2016-10-05 ENCOUNTER — Other Ambulatory Visit (HOSPITAL_COMMUNITY): Payer: No Typology Code available for payment source

## 2016-10-09 ENCOUNTER — Other Ambulatory Visit: Payer: Self-pay | Admitting: Physician Assistant

## 2016-10-09 ENCOUNTER — Other Ambulatory Visit: Payer: Self-pay | Admitting: Hematology and Oncology

## 2016-10-09 ENCOUNTER — Ambulatory Visit
Admission: RE | Admit: 2016-10-09 | Discharge: 2016-10-09 | Disposition: A | Discharge status: Home / Self Care | Payer: No Typology Code available for payment source | Source: Ambulatory Visit | Attending: Hematology and Oncology | Admitting: Hematology and Oncology

## 2016-10-09 DIAGNOSIS — R599 Enlarged lymph nodes, unspecified: Secondary | ICD-10-CM

## 2016-10-09 DIAGNOSIS — Z170 Estrogen receptor positive status [ER+]: Principal | ICD-10-CM

## 2016-10-09 DIAGNOSIS — N63 Unspecified lump in unspecified breast: Secondary | ICD-10-CM

## 2016-10-09 DIAGNOSIS — C50212 Malignant neoplasm of upper-inner quadrant of left female breast: Secondary | ICD-10-CM

## 2016-10-09 MED ORDER — GADOBENATE DIMEGLUMINE 529 MG/ML IV SOLN
10.0000 mL | Freq: Once | INTRAVENOUS | Status: AC | PRN
  Administered 2016-10-09: 10 mL via INTRAVENOUS

## 2016-10-10 ENCOUNTER — Ambulatory Visit (HOSPITAL_COMMUNITY)
Admission: RE | Admit: 2016-10-10 | Discharge: 2016-10-10 | Disposition: A | Discharge status: Home / Self Care | Payer: No Typology Code available for payment source | Source: Ambulatory Visit | Attending: Hematology and Oncology | Admitting: Hematology and Oncology

## 2016-10-10 ENCOUNTER — Encounter (HOSPITAL_COMMUNITY): Payer: Self-pay

## 2016-10-10 ENCOUNTER — Other Ambulatory Visit: Payer: Self-pay | Admitting: Hematology and Oncology

## 2016-10-10 DIAGNOSIS — Z8249 Family history of ischemic heart disease and other diseases of the circulatory system: Secondary | ICD-10-CM | POA: Diagnosis not present

## 2016-10-10 DIAGNOSIS — F909 Attention-deficit hyperactivity disorder, unspecified type: Secondary | ICD-10-CM | POA: Diagnosis not present

## 2016-10-10 DIAGNOSIS — C50311 Malignant neoplasm of lower-inner quadrant of right female breast: Secondary | ICD-10-CM | POA: Diagnosis present

## 2016-10-10 DIAGNOSIS — Z780 Asymptomatic menopausal state: Secondary | ICD-10-CM | POA: Diagnosis not present

## 2016-10-10 DIAGNOSIS — Z87891 Personal history of nicotine dependence: Secondary | ICD-10-CM | POA: Insufficient documentation

## 2016-10-10 DIAGNOSIS — C50212 Malignant neoplasm of upper-inner quadrant of left female breast: Secondary | ICD-10-CM

## 2016-10-10 DIAGNOSIS — Z170 Estrogen receptor positive status [ER+]: Principal | ICD-10-CM

## 2016-10-10 DIAGNOSIS — N6019 Diffuse cystic mastopathy of unspecified breast: Secondary | ICD-10-CM | POA: Diagnosis not present

## 2016-10-10 HISTORY — PX: IR GENERIC HISTORICAL: IMG1180011

## 2016-10-10 LAB — CBC
HCT: 38.4 % (ref 36.0–46.0)
HEMOGLOBIN: 13.3 g/dL (ref 12.0–15.0)
MCH: 30.9 pg (ref 26.0–34.0)
MCHC: 34.6 g/dL (ref 30.0–36.0)
MCV: 89.1 fL (ref 78.0–100.0)
Platelets: 238 10*3/uL (ref 150–400)
RBC: 4.31 MIL/uL (ref 3.87–5.11)
RDW: 12.1 % (ref 11.5–15.5)
WBC: 5.6 10*3/uL (ref 4.0–10.5)

## 2016-10-10 LAB — PROTIME-INR
INR: 0.96
PROTHROMBIN TIME: 12.8 s (ref 11.4–15.2)

## 2016-10-10 LAB — APTT: APTT: 34 s (ref 24–36)

## 2016-10-10 MED ORDER — LIDOCAINE-EPINEPHRINE (PF) 2 %-1:200000 IJ SOLN
INTRAMUSCULAR | Status: AC | PRN
  Administered 2016-10-10: 10 mL

## 2016-10-10 MED ORDER — FENTANYL CITRATE (PF) 100 MCG/2ML IJ SOLN
INTRAMUSCULAR | Status: AC
  Filled 2016-10-10: qty 4

## 2016-10-10 MED ORDER — CEFAZOLIN SODIUM-DEXTROSE 2-4 GM/100ML-% IV SOLN
INTRAVENOUS | Status: AC
  Filled 2016-10-10: qty 100

## 2016-10-10 MED ORDER — NALOXONE HCL 0.4 MG/ML IJ SOLN
INTRAMUSCULAR | Status: AC
  Filled 2016-10-10: qty 1

## 2016-10-10 MED ORDER — FLUMAZENIL 0.5 MG/5ML IV SOLN
INTRAVENOUS | Status: AC
  Filled 2016-10-10: qty 5

## 2016-10-10 MED ORDER — MIDAZOLAM HCL 2 MG/2ML IJ SOLN
INTRAMUSCULAR | Status: AC | PRN
  Administered 2016-10-10 (×3): 0.5 mg via INTRAVENOUS
  Administered 2016-10-10: 1 mg via INTRAVENOUS

## 2016-10-10 MED ORDER — MIDAZOLAM HCL 2 MG/2ML IJ SOLN
INTRAMUSCULAR | Status: AC
  Filled 2016-10-10: qty 6

## 2016-10-10 MED ORDER — HEPARIN SOD (PORK) LOCK FLUSH 100 UNIT/ML IV SOLN
INTRAVENOUS | Status: AC
  Filled 2016-10-10: qty 5

## 2016-10-10 MED ORDER — CEFAZOLIN SODIUM-DEXTROSE 2-4 GM/100ML-% IV SOLN
2.0000 g | Freq: Once | INTRAVENOUS | Status: AC
  Administered 2016-10-10: 2 g via INTRAVENOUS

## 2016-10-10 MED ORDER — FENTANYL CITRATE (PF) 100 MCG/2ML IJ SOLN
INTRAMUSCULAR | Status: AC | PRN
  Administered 2016-10-10: 25 ug via INTRAVENOUS
  Administered 2016-10-10: 50 ug via INTRAVENOUS
  Administered 2016-10-10: 25 ug via INTRAVENOUS

## 2016-10-10 MED ORDER — LIDOCAINE-EPINEPHRINE (PF) 2 %-1:200000 IJ SOLN
INTRAMUSCULAR | Status: AC
  Filled 2016-10-10: qty 20

## 2016-10-10 MED ORDER — SODIUM CHLORIDE 0.9 % IV SOLN
INTRAVENOUS | Status: DC
  Administered 2016-10-10: 12:00:00 via INTRAVENOUS

## 2016-10-10 NOTE — Procedures (Signed)
Interventional Radiology Procedure Note  Procedure: Placement of a right IJ approach single lumen PowerPort.  Tip is positioned at the superior cavoatrial junction and catheter is ready for immediate use.  Complications: No immediate Recommendations:  - Ok to shower tomorrow - Do not submerge for 7 days - Routine line care   Signed,  Shmiel Morton K. Kang Ishida, MD   

## 2016-10-10 NOTE — Discharge Instructions (Signed)

## 2016-10-10 NOTE — Consult Note (Signed)
Chief Complaint: Patient was seen in consultation today for port-a cath placement  Referring Physician(s): Gudena,Vinay  Supervising Physician: Jacqulynn Cadet  Patient Status: [facility] - Out-pt  History of Present Illness: Miranda Woods is a 61 y.o. female with history of recently diagnosed left breast cancer who presents today for Port-A-Cath placement for chemotherapy.  Past Medical History:  Diagnosis Date  . ADHD (attention deficit hyperactivity disorder)   . Fibrocystic breast disease     Past Surgical History:  Procedure Laterality Date  . APPENDECTOMY    . ORIF WRIST FRACTURE      Allergies: Codeine  Medications: Prior to Admission medications   Medication Sig Start Date End Date Taking? Authorizing Provider  hydrOXYzine (ATARAX/VISTARIL) 10 MG tablet Take 5 mg by mouth as needed.   Yes Historical Provider, MD  letrozole (FEMARA) 2.5 MG tablet Take 1 tablet (2.5 mg total) by mouth daily. 09/20/16  Yes Nicholas Lose, MD  LORazepam (ATIVAN) 0.5 MG tablet Take 1 tablet (0.5 mg total) by mouth every 6 (six) hours as needed (Nausea or vomiting). 09/28/16  Yes Nicholas Lose, MD  methylphenidate (RITALIN) 5 MG tablet Take 5 mg by mouth 2 (two) times daily.   Yes Historical Provider, MD  dexamethasone (DECADRON) 4 MG tablet Take 0.5 tablets (2 mg total) by mouth daily. 1/2 tab day before Taxotere. Then again the day after chemo for 1 day 09/28/16   Nicholas Lose, MD  lidocaine-prilocaine (EMLA) cream Apply to affected area once 09/28/16   Nicholas Lose, MD  lidocaine-prilocaine (EMLA) cream Apply to affected area once 09/28/16   Nicholas Lose, MD  ondansetron (ZOFRAN) 8 MG tablet Take 1 tablet (8 mg total) by mouth 2 (two) times daily as needed for refractory nausea / vomiting. Start on day 3 after chemo. 09/28/16   Nicholas Lose, MD  prochlorperazine (COMPAZINE) 10 MG tablet Take 1 tablet (10 mg total) by mouth every 6 (six) hours as needed (Nausea or vomiting). 09/28/16   Nicholas Lose, MD      Family History  Problem Relation Age of Onset  . Heart disease Father   . Hypertension Father   . Thyroid disease Mother     Social History   Social History  . Marital status: Single    Spouse name: [name]  . Number of children: 0  . Years of education: N/A   Occupational History  . counselor    Social History Main Topics  . Smoking status: Former Smoker    Types: Cigarettes  . Smokeless tobacco: Never Used  . Alcohol use 0.0 oz/week  . Drug use: No  . Sexual activity: Yes    Partners: Male    Birth control/ protection: Post-menopausal   Other Topics Concern  . None   Social History Narrative  . None      Review of Systems Denies fever, headache, chest pain, dyspnea, abdominal/back pain, nausea, vomiting or abnormal bleeding. She does have occasional cough. She is very anxious and tearful.  Vital Signs: BP (!) 142/96 (BP Location: Right Arm)   Pulse 90   Temp 97.9 F (36.6 C) (Oral)   Resp 16   LMP 11/28/2011   SpO2 100%   Physical Exam Awake, alert. Chest - CTA bilat; heart- RRR; abd- soft,+BS,NT; ext- no edema  Mallampati Score:     Imaging: Mr Breast Bilateral W Wo Contrast  Result Date: 09/27/2016 CLINICAL DATA:  Recent diagnosis of left breast cancer. LABS:  Creatinine was obtained on site at  [facility] at [place] [address] Results:  GFR 60 EXAM: BILATERAL BREAST MRI WITH AND WITHOUT CONTRAST TECHNIQUE: Multiplanar, multisequence MR images of both breasts were obtained prior to and following the intravenous administration of 11 ml of MultiHance. THREE-DIMENSIONAL MR IMAGE RENDERING ON INDEPENDENT WORKSTATION: Three-dimensional MR images were rendered by post-processing of the original MR data on an independent workstation. The three-dimensional MR images were interpreted, and findings are reported in the following complete MRI report for this study. Three dimensional images were evaluated at the independent DynaCad workstation  COMPARISON:  Previous exam(s). FINDINGS: Breast composition: d. Extreme fibroglandular tissue. Background parenchymal enhancement: Mild Right breast: There is a 0.6 x 0.5 x 0.5 cm mass at the posterior depth 6 o'clock right breast with washout enhancement kinetics. There is a 0.5 x 0.5 x 0.5 cm mass at the right breast lower inner quadrant anterior to middle depth with plateau enhancement kinetics. Left breast: There is a irregular necrotic mass at the slight medial lower left breast measuring 3.4 x 3.4 x 3.5 cm with plateau enhancement kinetics consistent with known biopsy cancer. Lymph nodes: There is abnormal thick cortex left axillary lymph node measuring 1.2 cm. Ancillary findings:  None. IMPRESSION: Suspicious findings. RECOMMENDATION: Recommend MRI guided core biopsies for the 2 enhancing masses within the right breast. Also recommend a second-look ultrasound with biopsy of abnormal left axillary lymph node. BI-RADS CATEGORY  4: Suspicious. Electronically Signed   By: Abelardo Diesel M.D.   On: 09/27/2016 10:19   Mm Radiologist Eval And Mgmt  Result Date: 09/15/2016 EXAM: ESTABLISHED PATIENT OFFICE VISIT - LEVEL II CHIEF COMPLAINT: Palpable left breast mass, post ultrasound-guided biopsy of palpable 4.7 cm mass in the left breast at the 9 o'clock position. HISTORY OF PRESENT ILLNESS: The patient returns for pathology results and wound check following ultrasound-guided biopsy of a palpable 4.7 cm mass in the inner left breast at the 9 o'clock position. EXAM: Biopsy site: Clean, dry, and intact. Steri-Strips overlie small skin incision. Mild bruising at the biopsy site. PATHOLOGY: Invasive ductal carcinoma, grade 2. This is concordant with the imaging findings. ASSESSMENT AND PLAN: ASSESSMENT AND PLAN All the patient's questions were answered by myself and the Breast Center nurse navigator, Terie Purser. She is aware of the results and demonstrates understanding. The patient is scheduled to be seen in  Multidisciplinary Clinic 09/20/2016. Electronically Signed   By: Everlean Alstrom M.D.   On: 09/15/2016 15:03   US Breast Ltd Uni Left Inc Axilla  Result Date: 10/03/2016 CLINICAL DATA:  Patient returns after MRI for evaluation of the left axilla. The area of concern is a possibly enlarged left axillary lymph node. Recent diagnosis of grade 2 invasive ductal carcinoma after ultrasound-guided core biopsy of a mass in the 9 o'clock location of the left breast. EXAM: ULTRASOUND OF THE LEFT BREAST COMPARISON:  Prior studies including MRI 09/27/2016 FINDINGS: On physical exam, I palpate no abnormality in the left axilla. Targeted ultrasound is performed, showing normal appearing left axillary contents including axillary lymph nodes with normal cortical thickness. No suspicious lymph nodes are identified. Evaluation of the upper-outer quadrant of the left breast demonstrates extremely dense fibroglandular tissue without suspicious mass. IMPRESSION: No suspicious lymph nodes in the left axilla. RECOMMENDATION: Treatment plan for known left breast cancer. I have discussed the findings and recommendations with the patient. Results were also provided in writing at the conclusion of the visit. If applicable, a reminder letter will be sent to the patient regarding the next  appointment. BI-RADS CATEGORY  2: Benign. Electronically Signed   By: Nolon Nations M.D.   On: 10/03/2016 15:42   US Breast Ltd Uni Left Inc Axilla  Result Date: 09/12/2016 CLINICAL DATA:  61 year old female with a left breast palpable abnormality. EXAM: 2D DIGITAL DIAGNOSTIC BILATERAL MAMMOGRAM WITH CAD AND ADJUNCT TOMO LEFT BREAST ULTRASOUND COMPARISON:  Previous exam(s). ACR Breast Density Category d: The breast tissue is extremely dense, which lowers the sensitivity of mammography. FINDINGS: There is a spiculated mass in the inner left breast containing coarse heterogeneous calcifications measuring approximately 4.3 cm. No suspicious masses or  calcifications are seen in the right breast. Mammographic images were processed with CAD. Physical examination of the left breast reveals a large firm involving the inner 2 retroareolar left breast. Targeted ultrasound of the left breast was performed demonstrating an irregular hypoechoic mass containing calcifications at 9 o'clock 1 cm from the nipple measuring 4.7 x 3.3 x 3.6 cm. This corresponds with the mass seen at mammography. No definite lymphadenopathy seen in the left axilla. IMPRESSION: Highly suspicious left breast mass measuring up to 4.7 cm. RECOMMENDATION: Ultrasound-guided biopsy of the mass in the left breast is recommended. This is being scheduled for the patient. I have discussed the findings and recommendations with the patient. Results were also provided in writing at the conclusion of the visit. If applicable, a reminder letter will be sent to the patient regarding the next appointment. BI-RADS CATEGORY  5: Highly suggestive of malignancy. Electronically Signed   By: Everlean Alstrom M.D.   On: 09/12/2016 16:19   Mm Diag Breast Tomo Bilateral  Result Date: 09/12/2016 CLINICAL DATA:  61 year old female with a left breast palpable abnormality. EXAM: 2D DIGITAL DIAGNOSTIC BILATERAL MAMMOGRAM WITH CAD AND ADJUNCT TOMO LEFT BREAST ULTRASOUND COMPARISON:  Previous exam(s). ACR Breast Density Category d: The breast tissue is extremely dense, which lowers the sensitivity of mammography. FINDINGS: There is a spiculated mass in the inner left breast containing coarse heterogeneous calcifications measuring approximately 4.3 cm. No suspicious masses or calcifications are seen in the right breast. Mammographic images were processed with CAD. Physical examination of the left breast reveals a large firm involving the inner 2 retroareolar left breast. Targeted ultrasound of the left breast was performed demonstrating an irregular hypoechoic mass containing calcifications at 9 o'clock 1 cm from the nipple  measuring 4.7 x 3.3 x 3.6 cm. This corresponds with the mass seen at mammography. No definite lymphadenopathy seen in the left axilla. IMPRESSION: Highly suspicious left breast mass measuring up to 4.7 cm. RECOMMENDATION: Ultrasound-guided biopsy of the mass in the left breast is recommended. This is being scheduled for the patient. I have discussed the findings and recommendations with the patient. Results were also provided in writing at the conclusion of the visit. If applicable, a reminder letter will be sent to the patient regarding the next appointment. BI-RADS CATEGORY  5: Highly suggestive of malignancy. Electronically Signed   By: Everlean Alstrom M.D.   On: 09/12/2016 16:19   Mm Clip Placement Left  Result Date: 09/14/2016 CLINICAL DATA:  Post ultrasound-guided biopsy of a highly suspicious mass in the left breast at 9 o'clock 1 cm from the nipple. EXAM: DIAGNOSTIC LEFT MAMMOGRAM POST ULTRASOUND BIOPSY COMPARISON:  Previous exam(s). FINDINGS: Mammographic images were obtained following ultrasound guided biopsy of the mass in the left breast at the 9 o'clock position periareolar. A ribbon shaped biopsy marking clip is present located in the inferior medial portion of the left breast mass. IMPRESSION:  Ribbon shaped biopsy marking clip at site of biopsied mass in the left breast at the 9 o'clock position. Final Assessment: Post Procedure Mammograms for Marker Placement Electronically Signed   By: Everlean Alstrom M.D.   On: 09/14/2016 13:44   Mm Clip Placement Right  Result Date: 10/09/2016 CLINICAL DATA:  Post MRI guided biopsy of the right breast 6 o'clock lesion and lower inner quadrant lesion. EXAM: DIAGNOSTIC RIGHT MAMMOGRAM POST ULTRASOUND BIOPSY COMPARISON:  Previous exam(s). FINDINGS: Mammographic images were obtained following MRI guided biopsy of right breast. Two-view mammography demonstrates presence of cylinder-shaped clip in the right 6 o'clock breast, posterior depth and dumbbell-shaped  clip in the right lower inner quadrant, middle depth. The clips appropriately positioned. IMPRESSION: Appropriate placement of post biopsy tissue markers in the right breast, post two MRI guided core needle biopsies. Final Assessment: Post Procedure Mammograms for Marker Placement Electronically Signed   By: Fidela Salisbury M.D.   On: 10/09/2016 10:15   [place] Lt Breast Bx W Loc Dev 1st Lesion Img Bx Spec US Guide  Result Date: 09/14/2016 CLINICAL DATA:  61 year old female with a suspicious mass in the left breast at the 9 o'clock position. EXAM: ULTRASOUND GUIDED LEFT BREAST CORE NEEDLE BIOPSY COMPARISON:  Previous exam(s). FINDINGS: I met with the patient and we discussed the procedure of ultrasound-guided biopsy, including benefits and alternatives. We discussed the high likelihood of a successful procedure. We discussed the risks of the procedure, including infection, bleeding, tissue injury, clip migration, and inadequate sampling. Informed written consent was given. The usual time-out protocol was performed immediately prior to the procedure. Using sterile technique and 1% Lidocaine as local anesthetic, under direct ultrasound visualization, a 12 gauge spring-loaded device was used to perform biopsy of the mass in the left breast at the 9 o'clock position using a medial to lateral approach. At the conclusion of the procedure a ribbon shaped tissue marker clip was deployed into the biopsy cavity. Follow up 2 view mammogram was performed and dictated separately. IMPRESSION: Ultrasound guided biopsy of the mass in the left breast at the 9 o'clock position. No apparent complications. Electronically Signed   By: Everlean Alstrom M.D.   On: 09/14/2016 13:42   Mr Rt Breast Bx Johnella Moloney Dev 1st Lesion Image Bx Spec Mr Guide  Result Date: 10/09/2016 CLINICAL DATA:  Right breast 6 o'clock posterior depth and right breast lower inner quadrant middle depth enhancing masses seen on diagnostic MRI in a patient with  biopsy-proven contralateral breast cancer. EXAM: MRI GUIDED CORE NEEDLE BIOPSY OF THE RIGHT BREAST TECHNIQUE: Multiplanar, multisequence MR imaging of the right breast was performed both before and after administration of intravenous contrast. CONTRAST:  73m MULTIHANCE GADOBENATE DIMEGLUMINE 529 MG/ML IV SOLN COMPARISON:  Previous exams. FINDINGS: I met with the patient, and we discussed the procedure of MRI guided biopsy, including risks, benefits, and alternatives. Specifically, we discussed the risks of infection, bleeding, tissue injury, clip migration, and inadequate sampling. Informed, written consent was given. The usual time out protocol was performed immediately prior to the procedure. Using sterile technique, 1% Lidocaine, MRI guidance, and a 9 gauge vacuum assisted device, biopsy was performed of right breast 6 o'clock lesion using a lateral approach. At the conclusion of the procedure, a cylinder shaped tissue marker clip was deployed into the biopsy cavity. Follow-up 2-view mammogram was performed and dictated separately. Next, using sterile technique, 1% Lidocaine, MRI guidance, and a 9 gauge vacuum assisted device, biopsy was performed of right breast lower inner  quadrant using a lateral approach. At the conclusion of the procedure, a dumbbell-shaped tissue marker clip was deployed into the biopsy cavity. Follow-up 2-view mammogram was performed and dictated separately. IMPRESSION: MRI guided biopsy of 2 right breast lesions. No apparent complications. Electronically Signed   By: Fidela Salisbury M.D.   On: 10/09/2016 10:13   Mr Rt Breast Bx W Loc Dev Ea Add Lesion Image Bx Spec Mr Guide  Result Date: 10/09/2016 CLINICAL DATA:  Right breast 6 o'clock posterior depth and right breast lower inner quadrant middle depth enhancing masses seen on diagnostic MRI in a patient with biopsy-proven contralateral breast cancer. EXAM: MRI GUIDED CORE NEEDLE BIOPSY OF THE RIGHT BREAST TECHNIQUE: Multiplanar,  multisequence MR imaging of the right breast was performed both before and after administration of intravenous contrast. CONTRAST:  44m MULTIHANCE GADOBENATE DIMEGLUMINE 529 MG/ML IV SOLN COMPARISON:  Previous exams. FINDINGS: I met with the patient, and we discussed the procedure of MRI guided biopsy, including risks, benefits, and alternatives. Specifically, we discussed the risks of infection, bleeding, tissue injury, clip migration, and inadequate sampling. Informed, written consent was given. The usual time out protocol was performed immediately prior to the procedure. Using sterile technique, 1% Lidocaine, MRI guidance, and a 9 gauge vacuum assisted device, biopsy was performed of right breast 6 o'clock lesion using a lateral approach. At the conclusion of the procedure, a cylinder shaped tissue marker clip was deployed into the biopsy cavity. Follow-up 2-view mammogram was performed and dictated separately. Next, using sterile technique, 1% Lidocaine, MRI guidance, and a 9 gauge vacuum assisted device, biopsy was performed of right breast lower inner quadrant using a lateral approach. At the conclusion of the procedure, a dumbbell-shaped tissue marker clip was deployed into the biopsy cavity. Follow-up 2-view mammogram was performed and dictated separately. IMPRESSION: MRI guided biopsy of 2 right breast lesions. No apparent complications. Electronically Signed   By: DFidela SalisburyM.D.   On: 10/09/2016 10:13    Labs:  CBC:  Recent Labs  09/20/16 1215 10/10/16 1152  WBC 4.6 5.6  HGB 13.2 13.3  HCT 38.7 38.4  PLT 275 238    COAGS: No results for input(s): INR, APTT in the last 8760 hours.  BMP:  Recent Labs  09/20/16 1215  NA 140  K 4.5  CO2 27  GLUCOSE 110  BUN 9.9  CALCIUM 9.7  CREATININE 0.7    LIVER FUNCTION TESTS:  Recent Labs  09/20/16 1215  BILITOT 0.47  AST 16  ALT 14  ALKPHOS 60  PROT 6.8  ALBUMIN 4.0    TUMOR MARKERS: No results for input(s):  AFPTM, CEA, CA199, CHROMGRNA in the last 8760 hours.  Assessment and Plan: 61y.o. female with history of recently diagnosed left breast cancer who presents today for Port-A-Cath placement for chemotherapy.Risks and benefits discussed with the patient/friend including, but not limited to bleeding, infection, pneumothorax, or fibrin sheath development and need for additional procedures. All of the patient's questions were answered, patient is agreeable to proceed. Consent signed and in chart.     Thank you for this interesting consult.  I greatly enjoyed meeting BVAANI MORRENand look forward to participating in their care.  A copy of this report was sent to the requesting provider on this date.  Electronically Signed: D. KRowe Robert3/13/2018, 12:49 PM   I spent a total of 25 minutes    in face to face in clinical consultation, greater than 50% of which was counseling/coordinating care for  port a cath placement

## 2016-10-11 ENCOUNTER — Ambulatory Visit (HOSPITAL_BASED_OUTPATIENT_CLINIC_OR_DEPARTMENT_OTHER): Payer: No Typology Code available for payment source

## 2016-10-11 ENCOUNTER — Other Ambulatory Visit (HOSPITAL_BASED_OUTPATIENT_CLINIC_OR_DEPARTMENT_OTHER): Payer: No Typology Code available for payment source

## 2016-10-11 ENCOUNTER — Telehealth: Payer: Self-pay | Admitting: *Deleted

## 2016-10-11 ENCOUNTER — Encounter: Payer: Self-pay | Admitting: *Deleted

## 2016-10-11 VITALS — BP 139/85 | HR 80 | Temp 98.0°F | Resp 16

## 2016-10-11 DIAGNOSIS — Z5112 Encounter for antineoplastic immunotherapy: Secondary | ICD-10-CM | POA: Diagnosis not present

## 2016-10-11 DIAGNOSIS — C50212 Malignant neoplasm of upper-inner quadrant of left female breast: Secondary | ICD-10-CM

## 2016-10-11 DIAGNOSIS — Z5189 Encounter for other specified aftercare: Secondary | ICD-10-CM

## 2016-10-11 DIAGNOSIS — Z170 Estrogen receptor positive status [ER+]: Principal | ICD-10-CM

## 2016-10-11 DIAGNOSIS — Z5111 Encounter for antineoplastic chemotherapy: Secondary | ICD-10-CM

## 2016-10-11 LAB — COMPREHENSIVE METABOLIC PANEL
ALBUMIN: 4 g/dL (ref 3.5–5.0)
ALK PHOS: 66 U/L (ref 40–150)
ALT: 13 U/L (ref 0–55)
ANION GAP: 10 meq/L (ref 3–11)
AST: 15 U/L (ref 5–34)
BILIRUBIN TOTAL: 0.48 mg/dL (ref 0.20–1.20)
BUN: 10.7 mg/dL (ref 7.0–26.0)
CALCIUM: 9.8 mg/dL (ref 8.4–10.4)
CO2: 25 mEq/L (ref 22–29)
Chloride: 105 mEq/L (ref 98–109)
Creatinine: 0.8 mg/dL (ref 0.6–1.1)
EGFR: 88 mL/min/{1.73_m2} — AB (ref >90)
GLUCOSE: 94 mg/dL (ref 70–140)
Potassium: 3.7 mEq/L (ref 3.5–5.1)
SODIUM: 140 meq/L (ref 136–145)
TOTAL PROTEIN: 6.9 g/dL (ref 6.4–8.3)

## 2016-10-11 LAB — CBC WITH DIFFERENTIAL/PLATELET
BASO%: 0.1 % (ref 0.0–2.0)
Basophils Absolute: 0 10*3/uL (ref 0.0–0.1)
EOS%: 0.5 % (ref 0.0–7.0)
Eosinophils Absolute: 0 10*3/uL (ref 0.0–0.5)
HEMATOCRIT: 37.8 % (ref 34.8–46.6)
HEMOGLOBIN: 13.2 g/dL (ref 11.6–15.9)
LYMPH%: 18.5 % (ref 14.0–49.7)
MCH: 31.3 pg (ref 25.1–34.0)
MCHC: 34.9 g/dL (ref 31.5–36.0)
MCV: 89.6 fL (ref 79.5–101.0)
MONO#: 0.7 10*3/uL (ref 0.1–0.9)
MONO%: 9.7 % (ref 0.0–14.0)
NEUT#: 5.4 10*3/uL (ref 1.5–6.5)
NEUT%: 71.2 % (ref 38.4–76.8)
PLATELETS: 229 10*3/uL (ref 145–400)
RBC: 4.22 10*6/uL (ref 3.70–5.45)
RDW: 12.1 % (ref 11.2–14.5)
WBC: 7.5 10*3/uL (ref 3.9–10.3)
lymph#: 1.4 10*3/uL (ref 0.9–3.3)
nRBC: 0 % (ref 0–0)

## 2016-10-11 MED ORDER — DEXAMETHASONE SODIUM PHOSPHATE 10 MG/ML IJ SOLN
10.0000 mg | Freq: Once | INTRAMUSCULAR | Status: AC
  Administered 2016-10-11: 10 mg via INTRAVENOUS

## 2016-10-11 MED ORDER — SODIUM CHLORIDE 0.9 % IV SOLN
573.0000 mg | Freq: Once | INTRAVENOUS | Status: AC
  Administered 2016-10-11: 570 mg via INTRAVENOUS
  Filled 2016-10-11: qty 57

## 2016-10-11 MED ORDER — DEXAMETHASONE SODIUM PHOSPHATE 10 MG/ML IJ SOLN
INTRAMUSCULAR | Status: AC
  Filled 2016-10-11: qty 1

## 2016-10-11 MED ORDER — PEGFILGRASTIM 6 MG/0.6ML ~~LOC~~ PSKT
6.0000 mg | PREFILLED_SYRINGE | Freq: Once | SUBCUTANEOUS | Status: AC
  Administered 2016-10-11: 6 mg via SUBCUTANEOUS
  Filled 2016-10-11: qty 0.6

## 2016-10-11 MED ORDER — PALONOSETRON HCL INJECTION 0.25 MG/5ML
0.2500 mg | Freq: Once | INTRAVENOUS | Status: AC
  Administered 2016-10-11: 0.25 mg via INTRAVENOUS

## 2016-10-11 MED ORDER — HEPARIN SOD (PORK) LOCK FLUSH 100 UNIT/ML IV SOLN
500.0000 [IU] | Freq: Once | INTRAVENOUS | Status: AC | PRN
  Administered 2016-10-11: 500 [IU]
  Filled 2016-10-11: qty 5

## 2016-10-11 MED ORDER — DIPHENHYDRAMINE HCL 25 MG PO CAPS
50.0000 mg | ORAL_CAPSULE | Freq: Once | ORAL | Status: AC
  Administered 2016-10-11: 50 mg via ORAL

## 2016-10-11 MED ORDER — PALONOSETRON HCL INJECTION 0.25 MG/5ML
INTRAVENOUS | Status: AC
  Filled 2016-10-11: qty 5

## 2016-10-11 MED ORDER — DOCETAXEL CHEMO INJECTION 160 MG/16ML
75.0000 mg/m2 | Freq: Once | INTRAVENOUS | Status: AC
  Administered 2016-10-11: 120 mg via INTRAVENOUS
  Filled 2016-10-11: qty 12

## 2016-10-11 MED ORDER — SODIUM CHLORIDE 0.9% FLUSH
10.0000 mL | INTRAVENOUS | Status: DC | PRN
  Administered 2016-10-11: 10 mL
  Filled 2016-10-11: qty 10

## 2016-10-11 MED ORDER — ACETAMINOPHEN 325 MG PO TABS
ORAL_TABLET | ORAL | Status: AC
  Filled 2016-10-11: qty 2

## 2016-10-11 MED ORDER — SODIUM CHLORIDE 0.9 % IV SOLN
Freq: Once | INTRAVENOUS | Status: AC
  Administered 2016-10-11: 09:00:00 via INTRAVENOUS

## 2016-10-11 MED ORDER — TRASTUZUMAB CHEMO 150 MG IV SOLR
8.0000 mg/kg | Freq: Once | INTRAVENOUS | Status: AC
  Administered 2016-10-11: 462 mg via INTRAVENOUS
  Filled 2016-10-11: qty 22

## 2016-10-11 MED ORDER — ACETAMINOPHEN 325 MG PO TABS
650.0000 mg | ORAL_TABLET | Freq: Once | ORAL | Status: AC
  Administered 2016-10-11: 650 mg via ORAL

## 2016-10-11 MED ORDER — SODIUM CHLORIDE 0.9 % IV SOLN
840.0000 mg | Freq: Once | INTRAVENOUS | Status: AC
  Administered 2016-10-11: 840 mg via INTRAVENOUS
  Filled 2016-10-11: qty 28

## 2016-10-11 MED ORDER — SODIUM CHLORIDE 0.9 % IV SOLN
10.0000 mg | Freq: Once | INTRAVENOUS | Status: DC
  Filled 2016-10-11: qty 1

## 2016-10-11 MED ORDER — DIPHENHYDRAMINE HCL 25 MG PO CAPS
ORAL_CAPSULE | ORAL | Status: AC
  Filled 2016-10-11: qty 2

## 2016-10-11 NOTE — Progress Notes (Signed)
Patient tolerated treatment well. Discharge and Neulasta instructions reviewed with patient, and patient verbalized understanding. Patient stable upon discharge.

## 2016-10-11 NOTE — Patient Instructions (Signed)
[facility] Discharge Instructions for Patients Receiving Chemotherapy  Today you received the following chemotherapy agents:  Herceptin, Perjeta, Taxotere, and Carboplatin.  To help prevent nausea and vomiting after your treatment, we encourage you to take your nausea medication as directed.   If you develop nausea and vomiting that is not controlled by your nausea medication, call the clinic.   BELOW ARE SYMPTOMS THAT SHOULD BE REPORTED IMMEDIATELY:  *FEVER GREATER THAN 100.5 F  *CHILLS WITH OR WITHOUT FEVER  NAUSEA AND VOMITING THAT IS NOT CONTROLLED WITH YOUR NAUSEA MEDICATION  *UNUSUAL SHORTNESS OF BREATH  *UNUSUAL BRUISING OR BLEEDING  TENDERNESS IN MOUTH AND THROAT WITH OR WITHOUT PRESENCE OF ULCERS  *URINARY PROBLEMS  *BOWEL PROBLEMS  UNUSUAL RASH Items with * indicate a potential emergency and should be followed up as soon as possible.  Feel free to call the clinic you have any questions or concerns. The clinic phone number is [phone].  Please show the [facility] at check-in to the Emergency Department and triage nurse.  Trastuzumab injection for infusion What is this medicine? TRASTUZUMAB (tras TOO zoo mab) is a monoclonal antibody. It is used to treat breast cancer and stomach cancer. This medicine may be used for other purposes; ask your health care provider or pharmacist if you have questions. COMMON BRAND NAME(S): Herceptin What should I tell my health care provider before I take this medicine? They need to know if you have any of these conditions: -heart disease -heart failure -lung or breathing disease, like asthma -an unusual or allergic reaction to trastuzumab, benzyl alcohol, or other medications, foods, dyes, or preservatives -pregnant or trying to get pregnant -breast-feeding How should I use this medicine? This drug is given as an infusion into a vein. It is administered in a hospital or clinic by a specially trained  health care professional. Talk to your pediatrician regarding the use of this medicine in children. This medicine is not approved for use in children. Overdosage: If you think you have taken too much of this medicine contact a poison control center or emergency room at once. NOTE: This medicine is only for you. Do not share this medicine with others. What if I miss a dose? It is important not to miss a dose. Call your doctor or health care professional if you are unable to keep an appointment. What may interact with this medicine? This medicine may interact with the following medications: -certain types of chemotherapy, such as daunorubicin, doxorubicin, epirubicin, and idarubicin This list may not describe all possible interactions. Give your health care provider a list of all the medicines, herbs, non-prescription drugs, or dietary supplements you use. Also tell them if you smoke, drink alcohol, or use illegal drugs. Some items may interact with your medicine. What should I watch for while using this medicine? Visit your doctor for checks on your progress. Report any side effects. Continue your course of treatment even though you feel ill unless your doctor tells you to stop. Call your doctor or health care professional for advice if you get a fever, chills or sore throat, or other symptoms of a cold or flu. Do not treat yourself. Try to avoid being around people who are sick. You may experience fever, chills and shaking during your first infusion. These effects are usually mild and can be treated with other medicines. Report any side effects during the infusion to your health care professional. Fever and chills usually do not happen with later infusions. Do  become pregnant while taking this medicine or for 7 months after stopping it. Women should inform their doctor if they wish to become pregnant or think they might be pregnant. Women of child-bearing potential will need to have a negative  pregnancy test before starting this medicine. There is a potential for serious side effects to an unborn child. Talk to your health care professional or pharmacist for more information. Do not breast-feed an infant while taking this medicine or for 7 months after stopping it. Women must use effective birth control with this medicine. What side effects may I notice from receiving this medicine? Side effects that you should report to your doctor or health care professional as soon as possible: -allergic reactions like skin rash, itching or hives, swelling of the face, lips, or tongue -chest pain or palpitations -cough -dizziness -feeling faint or lightheaded, falls -fever -general ill feeling or flu-like symptoms -signs of worsening heart failure like breathing problems; swelling in your legs and feet -unusually weak or tired Side effects that usually do not require medical attention (report to your doctor or health care professional if they continue or are bothersome): -bone pain -changes in taste -diarrhea -joint pain -nausea/vomiting -weight loss This list may not describe all possible side effects. Call your doctor for medical advice about side effects. You may report side effects to FDA at 1-800-FDA-1088. Where should I keep my medicine? This drug is given in a hospital or clinic and will not be stored at home. NOTE: This sheet is a summary. It may not cover all possible information. If you have questions about this medicine, talk to your doctor, pharmacist, or health care provider.  2018 Elsevier/Gold Standard (2016-07-11 14:37:52)  Pertuzumab injection What is this medicine? PERTUZUMAB (per TOOZ ue mab) is a monoclonal antibody. It is used to treat breast cancer. This medicine may be used for other purposes; ask your health care provider or pharmacist if you have questions. COMMON BRAND NAME(S): PERJETA What should I tell my health care provider before I take this medicine? They  need to know if you have any of these conditions: -heart disease -heart failure -high blood pressure -history of irregular heart beat -recent or ongoing radiation therapy -an unusual or allergic reaction to pertuzumab, other medicines, foods, dyes, or preservatives -pregnant or trying to get pregnant -breast-feeding How should I use this medicine? This medicine is for infusion into a vein. It is given by a health care professional in a hospital or clinic setting. Talk to your pediatrician regarding the use of this medicine in children. Special care may be needed. Overdosage: If you think you have taken too much of this medicine contact a poison control center or emergency room at once. NOTE: This medicine is only for you. Do not share this medicine with others. What if I miss a dose? It is important not to miss your dose. Call your doctor or health care professional if you are unable to keep an appointment. What may interact with this medicine? Interactions are not expected. Give your health care provider a list of all the medicines, herbs, non-prescription drugs, or dietary supplements you use. Also tell them if you smoke, drink alcohol, or use illegal drugs. Some items may interact with your medicine. This list may not describe all possible interactions. Give your health care provider a list of all the medicines, herbs, non-prescription drugs, or dietary supplements you use. Also tell them if you smoke, drink alcohol, or use illegal drugs. Some items may interact   with your medicine. What should I watch for while using this medicine? Your condition will be monitored carefully while you are receiving this medicine. Report any side effects. Continue your course of treatment even though you feel ill unless your doctor tells you to stop. Do not become pregnant while taking this medicine or for 7 months after stopping it. Women should inform their doctor if they wish to become pregnant or think  they might be pregnant. Women of child-bearing potential will need to have a negative pregnancy test before starting this medicine. There is a potential for serious side effects to an unborn child. Talk to your health care professional or pharmacist for more information. Do not breast-feed an infant while taking this medicine or for 7 months after stopping it. Women must use effective birth control with this medicine. Call your doctor or health care professional for advice if you get a fever, chills or sore throat, or other symptoms of a cold or flu. Do not treat yourself. Try to avoid being around people who are sick. You may experience fever, chills, and headache during the infusion. Report any side effects during the infusion to your health care professional. What side effects may I notice from receiving this medicine? Side effects that you should report to your doctor or health care professional as soon as possible: -breathing problems -chest pain or palpitations -dizziness -feeling faint or lightheaded -fever or chills -skin rash, itching or hives -sore throat -swelling of the face, lips, or tongue -swelling of the legs or ankles -unusually weak or tired Side effects that usually do not require medical attention (report to your doctor or health care professional if they continue or are bothersome): -diarrhea -hair loss -nausea, vomiting -tiredness This list may not describe all possible side effects. Call your doctor for medical advice about side effects. You may report side effects to FDA at 1-800-FDA-1088. Where should I keep my medicine? This drug is given in a hospital or clinic and will not be stored at home. NOTE: This sheet is a summary. It may not cover all possible information. If you have questions about this medicine, talk to your doctor, pharmacist, or health care provider.  2018 Elsevier/Gold Standard (2015-08-19 12:08:50)  Docetaxel injection What is this  medicine? DOCETAXEL (doe se TAX el) is a chemotherapy drug. It targets fast dividing cells, like cancer cells, and causes these cells to die. This medicine is used to treat many types of cancers like breast cancer, certain stomach cancers, head and neck cancer, lung cancer, and prostate cancer. This medicine may be used for other purposes; ask your health care provider or pharmacist if you have questions. COMMON BRAND NAME(S): Docefrez, Taxotere What should I tell my health care provider before I take this medicine? They need to know if you have any of these conditions: -infection (especially a virus infection such as chickenpox, cold sores, or herpes) -liver disease -low blood counts, like low white cell, platelet, or red cell counts -an unusual or allergic reaction to docetaxel, polysorbate 80, other chemotherapy agents, other medicines, foods, dyes, or preservatives -pregnant or trying to get pregnant -breast-feeding How should I use this medicine? This drug is given as an infusion into a vein. It is administered in a hospital or clinic by a specially trained health care professional. Talk to your pediatrician regarding the use of this medicine in children. Special care may be needed. Overdosage: If you think you have taken too much of this medicine contact a poison control   center or emergency room at once. NOTE: This medicine is only for you. Do not share this medicine with others. What if I miss a dose? It is important not to miss your dose. Call your doctor or health care professional if you are unable to keep an appointment. What may interact with this medicine? -cyclosporine -erythromycin -ketoconazole -medicines to increase blood counts like filgrastim, pegfilgrastim, sargramostim -vaccines Talk to your doctor or health care professional before taking any of these medicines: -acetaminophen -aspirin -ibuprofen -ketoprofen -naproxen This list may not describe all possible  interactions. Give your health care provider a list of all the medicines, herbs, non-prescription drugs, or dietary supplements you use. Also tell them if you smoke, drink alcohol, or use illegal drugs. Some items may interact with your medicine. What should I watch for while using this medicine? Your condition will be monitored carefully while you are receiving this medicine. You will need important blood work done while you are taking this medicine. This drug may make you feel generally unwell. This is not uncommon, as chemotherapy can affect healthy cells as well as cancer cells. Report any side effects. Continue your course of treatment even though you feel ill unless your doctor tells you to stop. In some cases, you may be given additional medicines to help with side effects. Follow all directions for their use. Call your doctor or health care professional for advice if you get a fever, chills or sore throat, or other symptoms of a cold or flu. Do not treat yourself. This drug decreases your body's ability to fight infections. Try to avoid being around people who are sick. This medicine may increase your risk to bruise or bleed. Call your doctor or health care professional if you notice any unusual bleeding. This medicine may contain alcohol in the product. You may get drowsy or dizzy. Do not drive, use machinery, or do anything that needs mental alertness until you know how this medicine affects you. Do not stand or sit up quickly, especially if you are an older patient. This reduces the risk of dizzy or fainting spells. Avoid alcoholic drinks. Do not become pregnant while taking this medicine. Women should inform their doctor if they wish to become pregnant or think they might be pregnant. There is a potential for serious side effects to an unborn child. Talk to your health care professional or pharmacist for more information. Do not breast-feed an infant while taking this medicine. What side effects  may I notice from receiving this medicine? Side effects that you should report to your doctor or health care professional as soon as possible: -allergic reactions like skin rash, itching or hives, swelling of the face, lips, or tongue -low blood counts - This drug may decrease the number of white blood cells, red blood cells and platelets. You may be at increased risk for infections and bleeding. -signs of infection - fever or chills, cough, sore throat, pain or difficulty passing urine -signs of decreased platelets or bleeding - bruising, pinpoint red spots on the skin, black, tarry stools, nosebleeds -signs of decreased red blood cells - unusually weak or tired, fainting spells, lightheadedness -breathing problems -fast or irregular heartbeat -low blood pressure -mouth sores -nausea and vomiting -pain, swelling, redness or irritation at the injection site -pain, tingling, numbness in the hands or feet -swelling of the ankle, feet, hands -weight gain Side effects that usually do not require medical attention (report to your doctor or health care professional if they continue or are   are bothersome): -bone pain -complete hair loss including hair on your head, underarms, pubic hair, eyebrows, and eyelashes -diarrhea -excessive tearing -changes in the color of fingernails -loosening of the fingernails -nausea -muscle pain -red flush to skin -sweating -weak or tired This list may not describe all possible side effects. Call your doctor for medical advice about side effects. You may report side effects to FDA at 1-800-FDA-1088. Where should I keep my medicine? This drug is given in a hospital or clinic and will not be stored at home. NOTE: This sheet is a summary. It may not cover all possible information. If you have questions about this medicine, talk to your doctor, pharmacist, or health care provider.  2018 Elsevier/Gold Standard (2015-08-19 12:32:56)  Carboplatin injection What is  this medicine? CARBOPLATIN (KAR boe pla tin) is a chemotherapy drug. It targets fast dividing cells, like cancer cells, and causes these cells to die. This medicine is used to treat ovarian cancer and many other cancers. This medicine may be used for other purposes; ask your health care provider or pharmacist if you have questions. COMMON BRAND NAME(S): Paraplatin What should I tell my health care provider before I take this medicine? They need to know if you have any of these conditions: -blood disorders -hearing problems -kidney disease -recent or ongoing radiation therapy -an unusual or allergic reaction to carboplatin, cisplatin, other chemotherapy, other medicines, foods, dyes, or preservatives -pregnant or trying to get pregnant -breast-feeding How should I use this medicine? This drug is usually given as an infusion into a vein. It is administered in a hospital or clinic by a specially trained health care professional. Talk to your pediatrician regarding the use of this medicine in children. Special care may be needed. Overdosage: If you think you have taken too much of this medicine contact a poison control center or emergency room at once. NOTE: This medicine is only for you. Do not share this medicine with others. What if I miss a dose? It is important not to miss a dose. Call your doctor or health care professional if you are unable to keep an appointment. What may interact with this medicine? -medicines for seizures -medicines to increase blood counts like filgrastim, pegfilgrastim, sargramostim -some antibiotics like amikacin, gentamicin, neomycin, streptomycin, tobramycin -vaccines Talk to your doctor or health care professional before taking any of these medicines: -acetaminophen -aspirin -ibuprofen -ketoprofen -naproxen This list may not describe all possible interactions. Give your health care provider a list of all the medicines, herbs, non-prescription drugs, or  dietary supplements you use. Also tell them if you smoke, drink alcohol, or use illegal drugs. Some items may interact with your medicine. What should I watch for while using this medicine? Your condition will be monitored carefully while you are receiving this medicine. You will need important blood work done while you are taking this medicine. This drug may make you feel generally unwell. This is not uncommon, as chemotherapy can affect healthy cells as well as cancer cells. Report any side effects. Continue your course of treatment even though you feel ill unless your doctor tells you to stop. In some cases, you may be given additional medicines to help with side effects. Follow all directions for their use. Call your doctor or health care professional for advice if you get a fever, chills or sore throat, or other symptoms of a cold or flu. Do not treat yourself. This drug decreases your body's ability to fight infections. Try to avoid being around people  who are sick. This medicine may increase your risk to bruise or bleed. Call your doctor or health care professional if you notice any unusual bleeding. Be careful brushing and flossing your teeth or using a toothpick because you may get an infection or bleed more easily. If you have any dental work done, tell your dentist you are receiving this medicine. Avoid taking products that contain aspirin, acetaminophen, ibuprofen, naproxen, or ketoprofen unless instructed by your doctor. These medicines may hide a fever. Do not become pregnant while taking this medicine. Women should inform their doctor if they wish to become pregnant or think they might be pregnant. There is a potential for serious side effects to an unborn child. Talk to your health care professional or pharmacist for more information. Do not breast-feed an infant while taking this medicine. What side effects may I notice from receiving this medicine? Side effects that you should report to  your doctor or health care professional as soon as possible: -allergic reactions like skin rash, itching or hives, swelling of the face, lips, or tongue -signs of infection - fever or chills, cough, sore throat, pain or difficulty passing urine -signs of decreased platelets or bleeding - bruising, pinpoint red spots on the skin, black, tarry stools, nosebleeds -signs of decreased red blood cells - unusually weak or tired, fainting spells, lightheadedness -breathing problems -changes in hearing -changes in vision -chest pain -high blood pressure -low blood counts - This drug may decrease the number of white blood cells, red blood cells and platelets. You may be at increased risk for infections and bleeding. -nausea and vomiting -pain, swelling, redness or irritation at the injection site -pain, tingling, numbness in the hands or feet -problems with balance, talking, walking -trouble passing urine or change in the amount of urine Side effects that usually do not require medical attention (report to your doctor or health care professional if they continue or are bothersome): -hair loss -loss of appetite -metallic taste in the mouth or changes in taste This list may not describe all possible side effects. Call your doctor for medical advice about side effects. You may report side effects to FDA at 1-800-FDA-1088. Where should I keep my medicine? This drug is given in a hospital or clinic and will not be stored at home. NOTE: This sheet is a summary. It may not cover all possible information. If you have questions about this medicine, talk to your doctor, pharmacist, or health care provider.  2018 Elsevier/Gold Standard (2007-10-22 14:38:05)

## 2016-10-12 ENCOUNTER — Telehealth: Payer: Self-pay | Admitting: Emergency Medicine

## 2016-10-12 NOTE — Telephone Encounter (Signed)
Called patient for chemo follow up call. Received her voicemail; her mailbox was full and unable to leave a message.

## 2016-10-16 ENCOUNTER — Other Ambulatory Visit: Payer: Self-pay | Admitting: Hematology and Oncology

## 2016-10-16 DIAGNOSIS — Z170 Estrogen receptor positive status [ER+]: Principal | ICD-10-CM

## 2016-10-16 DIAGNOSIS — C50212 Malignant neoplasm of upper-inner quadrant of left female breast: Secondary | ICD-10-CM

## 2016-10-17 ENCOUNTER — Other Ambulatory Visit: Payer: Self-pay | Admitting: Hematology and Oncology

## 2016-10-17 ENCOUNTER — Telehealth: Payer: Self-pay | Admitting: *Deleted

## 2016-10-17 DIAGNOSIS — Z170 Estrogen receptor positive status [ER+]: Principal | ICD-10-CM

## 2016-10-17 DIAGNOSIS — C50212 Malignant neoplasm of upper-inner quadrant of left female breast: Secondary | ICD-10-CM

## 2016-10-17 NOTE — Progress Notes (Signed)
Patient Care Team: No Pcp Per Patient as PCP - General (General Practice) [name], MD as Consulting Physician (General Surgery) Nicholas Lose, MD as Consulting Physician (Hematology and Oncology) Eppie Gibson, MD as Attending Physician (Radiation Oncology)  DIAGNOSIS:  Encounter Diagnosis  Name Primary?  . Malignant neoplasm of upper-inner quadrant of left breast in female, estrogen receptor positive ([place]) Yes    SUMMARY OF ONCOLOGIC HISTORY:   Malignant neoplasm of upper-inner quadrant of left breast in female, estrogen receptor positive ([place])   09/14/2016 Initial Diagnosis    Palpable left breast mass for 2 months, ultrasound at 9:00: 4.7 x 3.6 x 3.3 cm, axilla negative: U/S Bx: Grade 2 IDC ER 95%, PR 50%, HER-2 positive ratio 6.17, Ki-67 20%, T2 N0 stage IIA clinical stage      09/27/2016 Breast MRI    Left breast: 3.5 cm irregular necrotic mass; left axillary lymph node 1.2 cm; right breast 0.6 cm mass at 6:00 position 0.5 cm mass right breast lower inner quadrant        CHIEF COMPLIANT: Follow-up after recent breast MRI and after getting second opinions.  INTERVAL HISTORY: Miranda Woods is a 61 year old with above-mentioned history of newly diagnosed left breast cancer who is here for evaluation after receiving her first cycle of neoadjuvant chemotherapy with Taxotere, Carboplatin, Herceptin, Perjeta.  She is doing relatively well.  She is fatigued, and feels as if she is in a fog. She had extensive bone pain in her pelvis on Monday night that resolved after taking Claritin, Tylenol, and Lorazepam.  She is drinking plenty of fluids.  She has some mild taste changes, and soreness in her mouth--without any mouth lesions.  She did develop a lesion on her forehead without injuring it and wants to know what it is.  She has not had any diarrhea as of yet.    REVIEW OF SYSTEMS:   Review of Systems  Constitutional: Positive for malaise/fatigue. Negative for chills, fever and  weight loss.  HENT: Negative for hearing loss and tinnitus.   Eyes: Negative for blurred vision and double vision.  Respiratory: Negative for cough and shortness of breath.   Cardiovascular: Negative for chest pain, palpitations and leg swelling.  Gastrointestinal: Negative for abdominal pain, constipation, diarrhea, heartburn, nausea and vomiting.  Skin: Negative for rash.  Neurological: Negative for dizziness, tingling, weakness and headaches.     All other systems were reviewed with the patient and are negative.  I have reviewed the past medical history, past surgical history, social history and family history with the patient and they are unchanged from previous note.  ALLERGIES:  is allergic to codeine.  MEDICATIONS:  Current Outpatient Prescriptions  Medication Sig Dispense Refill  . dexamethasone (DECADRON) 4 MG tablet Take 0.5 tablets (2 mg total) by mouth daily. 1/2 tab day before Taxotere. Then again the day after chemo for 1 day 10 tablet 0  . letrozole (FEMARA) 2.5 MG tablet Take 1 tablet (2.5 mg total) by mouth daily. 90 tablet 3  . lidocaine-prilocaine (EMLA) cream Apply to affected area once 30 g 3  . LORazepam (ATIVAN) 0.5 MG tablet Take 1-2 tablets (0.5-1 mg total) by mouth every 8 (eight) hours as needed for anxiety. 60 tablet 0  . methylphenidate (RITALIN) 5 MG tablet Take 5 mg by mouth 2 (two) times daily as needed.     . ondansetron (ZOFRAN) 8 MG tablet Take 1 tablet (8 mg total) by mouth 2 (two) times daily as needed for refractory  nausea / vomiting. Start on day 3 after chemo. 30 tablet 1  . prochlorperazine (COMPAZINE) 10 MG tablet Take 1 tablet (10 mg total) by mouth every 6 (six) hours as needed (Nausea or vomiting). 30 tablet 1  . UNABLE TO FIND Bone broth    . hydrOXYzine (ATARAX/VISTARIL) 10 MG tablet Take 5 mg by mouth as needed.    . traMADol (ULTRAM) 50 MG tablet Take 1 tablet (50 mg total) by mouth every 6 (six) hours as needed. 30 tablet 0   No current  facility-administered medications for this visit.     PHYSICAL EXAMINATION: ECOG PERFORMANCE STATUS: 1 - Symptomatic but completely ambulatory  Vitals:   10/18/16 1042  BP: (!) 148/69  Pulse: 87  Resp: 18  Temp: 97.7 F (36.5 C)   Filed Weights   10/18/16 1042  Weight: 125 lb 9.6 oz (57 kg)  GENERAL: Patient is a well appearing female in no acute distress HEENT:  Sclerae anicteric.  Oropharynx clear and moist. No ulcerations or evidence of oropharyngeal candidiasis. Neck is supple.  NODES:  No cervical, supraclavicular, or axillary lymphadenopathy palpated.  BREAST EXAM:  Deferred. LUNGS:  Clear to auscultation bilaterally.  No wheezes or rhonchi. HEART:  Regular rate and rhythm. No murmur appreciated. ABDOMEN:  Soft, nontender.  Positive, normoactive bowel sounds. No organomegaly palpated. MSK:  No focal spinal tenderness to palpation. Full range of motion bilaterally in the upper extremities. EXTREMITIES:  No peripheral edema.   SKIN:  Clear with no obvious rashes or skin changes. No nail dyscrasia. NEURO:  Nonfocal. Well oriented.  Appropriate affect.     LABORATORY DATA:  I have reviewed the data as listed   Chemistry      Component Value Date/Time   NA 138 10/18/2016 0948   K 4.0 10/18/2016 0948   CL 101 11/30/2011 1649   CO2 26 10/18/2016 0948   BUN 6.3 (L) 10/18/2016 0948   CREATININE 0.7 10/18/2016 0948      Component Value Date/Time   CALCIUM 9.3 10/18/2016 0948   ALKPHOS 84 10/18/2016 0948   AST 26 10/18/2016 0948   ALT 15 10/18/2016 0948   BILITOT 0.31 10/18/2016 0948       Lab Results  Component Value Date   WBC 20.0 (H) 10/18/2016   HGB 13.2 10/18/2016   HCT 39.1 10/18/2016   MCV 91.2 10/18/2016   PLT 176 10/18/2016   NEUTROABS 15.6 (H) 10/18/2016    ASSESSMENT & PLAN:  Malignant neoplasm of upper-inner quadrant of left breast in female, estrogen receptor positive ([place]) Palpable left breast mass for 2 months, ultrasound at 9:00: 4.7 x 3.6  x 3.3 cm, axilla negative: U/S Bx: Grade 2 IDC ER 95%, PR 50%, HER-2 positive ratio 6.17, Ki-67 20%,   MRI breast 09/28/2016: Left breast: 3.5 cm irregular necrotic mass; left axillary lymph node 1.2 cm; right breast 0.6 cm mass at 6:00 position 0.5 cm mass right breast lower inner quadrant  T2 N0 stage IIA clinical stage (could be stage IIB if the lymph node in the left axilla is positive)  Recommendation based on multidisciplinary tumor board: 1. Neoadjuvant chemotherapy with TCH Perjeta 6 cycles followed by Herceptin and Perjeta maintenance for 1 year 2. Followed by breast conserving surgery if possible with sentinel lymph node study 3. Followed by adjuvant radiation therapy if patient had lumpectomy 4. Followed by adjuvant antiestrogen therapy  Plan:  Aleira is doing well today.  I reviewed her labs with her and they are normal  with the exception of a WBC of 20k, likely due to Neulasta.  She and I spent time talking about her nausea regimen, her oral intake, and the lesion on her forehead.  I encouraged she continue to drink plenty of fluids.  I refilled her Lorazepam today.  I also prescribed Tramadol in case she has any further pain with subsequent regimens of treatment.  Overall, I think she tolerated treatment very well.    The patient has a good understanding of the overall plan. she agrees with it. she will call with any problems that may develop before the next visit here.  A total of (30) minutes of face-to-face time was spent with this patient with greater than 50% of that time in counseling and care-coordination.    Scot Dock, NP 10/18/16

## 2016-10-17 NOTE — Telephone Encounter (Signed)
"  This is Miranda Woods calling requesting an appointment for this patient.  She was awakened at 4:00 am with lower back pain radiating to her hips.  We called last night.  Staff said to call this morning at 8:00 for her to be seen today.  Rated pain as ten on pain scale.  Took Claritin and ativan which brought pain down to a five.  Has not tried any Tylenol or Ibuprofen. Return number [phone]."  First TCH Perjetta was 10-11-2016 with [facility].  Next scheduled F/U tomorrow with Delice Bison NP.   Will notify provider.  This nurse advised she try Tylenol or Ibuprofen.

## 2016-10-17 NOTE — Telephone Encounter (Signed)
Called pt back and pt pain is now 5/10 after taking claritin and lorazepam. Pt just took 2 tylenol and will see if the pain goes away. Pt applying warm compress on back and advised pt to do some light stretches and practive breathing exercises as well to help with pain. Pt to call back around noon to give update on pt symptom. Pt denies fevers, chills, cp , sob, fatigue, n/v/d, urinary issues. Pt instructed to increase fluid hydration as well. Pt verbalized understanding.

## 2016-10-18 ENCOUNTER — Encounter: Payer: Self-pay | Admitting: Adult Health

## 2016-10-18 ENCOUNTER — Ambulatory Visit (HOSPITAL_BASED_OUTPATIENT_CLINIC_OR_DEPARTMENT_OTHER): Payer: No Typology Code available for payment source | Admitting: Adult Health

## 2016-10-18 ENCOUNTER — Ambulatory Visit: Payer: No Typology Code available for payment source

## 2016-10-18 ENCOUNTER — Other Ambulatory Visit (HOSPITAL_BASED_OUTPATIENT_CLINIC_OR_DEPARTMENT_OTHER): Payer: No Typology Code available for payment source

## 2016-10-18 ENCOUNTER — Encounter: Payer: Self-pay | Admitting: *Deleted

## 2016-10-18 VITALS — BP 148/69 | HR 87 | Temp 97.7°F | Resp 18 | Ht 65.5 in | Wt 125.6 lb

## 2016-10-18 DIAGNOSIS — C50212 Malignant neoplasm of upper-inner quadrant of left female breast: Secondary | ICD-10-CM

## 2016-10-18 DIAGNOSIS — Z95828 Presence of other vascular implants and grafts: Secondary | ICD-10-CM | POA: Insufficient documentation

## 2016-10-18 DIAGNOSIS — Z170 Estrogen receptor positive status [ER+]: Principal | ICD-10-CM

## 2016-10-18 LAB — COMPREHENSIVE METABOLIC PANEL
ALBUMIN: 3.8 g/dL (ref 3.5–5.0)
ALT: 15 U/L (ref 0–55)
ANION GAP: 8 meq/L (ref 3–11)
AST: 26 U/L (ref 5–34)
Alkaline Phosphatase: 84 U/L (ref 40–150)
BILIRUBIN TOTAL: 0.31 mg/dL (ref 0.20–1.20)
BUN: 6.3 mg/dL — ABNORMAL LOW (ref 7.0–26.0)
CALCIUM: 9.3 mg/dL (ref 8.4–10.4)
CHLORIDE: 104 meq/L (ref 98–109)
CO2: 26 meq/L (ref 22–29)
CREATININE: 0.7 mg/dL (ref 0.6–1.1)
EGFR: >90 mL/min/{1.73_m2} (ref >90)
GLUCOSE: 94 mg/dL (ref 70–140)
Potassium: 4 mEq/L (ref 3.5–5.1)
Sodium: 138 mEq/L (ref 136–145)
Total Protein: 6.7 g/dL (ref 6.4–8.3)

## 2016-10-18 LAB — CBC WITH DIFFERENTIAL/PLATELET
BASO%: 0.4 % (ref 0.0–2.0)
BASOS ABS: 0.1 10*3/uL (ref 0.0–0.1)
EOS%: 0.1 % (ref 0.0–7.0)
Eosinophils Absolute: 0 10*3/uL (ref 0.0–0.5)
HEMATOCRIT: 39.1 % (ref 34.8–46.6)
HEMOGLOBIN: 13.2 g/dL (ref 11.6–15.9)
LYMPH#: 2.3 10*3/uL (ref 0.9–3.3)
LYMPH%: 11.7 % — ABNORMAL LOW (ref 14.0–49.7)
MCH: 30.7 pg (ref 25.1–34.0)
MCHC: 33.7 g/dL (ref 31.5–36.0)
MCV: 91.2 fL (ref 79.5–101.0)
MONO#: 2 10*3/uL — AB (ref 0.1–0.9)
MONO%: 9.9 % (ref 0.0–14.0)
NEUT#: 15.6 10*3/uL — ABNORMAL HIGH (ref 1.5–6.5)
NEUT%: 77.9 % — ABNORMAL HIGH (ref 38.4–76.8)
PLATELETS: 176 10*3/uL (ref 145–400)
RBC: 4.29 10*6/uL (ref 3.70–5.45)
RDW: 12.2 % (ref 11.2–14.5)
WBC: 20 10*3/uL — ABNORMAL HIGH (ref 3.9–10.3)

## 2016-10-18 MED ORDER — SODIUM CHLORIDE 0.9% FLUSH
10.0000 mL | INTRAVENOUS | Status: DC | PRN
  Filled 2016-10-18: qty 10

## 2016-10-18 MED ORDER — HEPARIN SOD (PORK) LOCK FLUSH 100 UNIT/ML IV SOLN
500.0000 [IU] | Freq: Once | INTRAVENOUS | Status: DC | PRN
  Filled 2016-10-18: qty 5

## 2016-10-18 MED ORDER — LORAZEPAM 0.5 MG PO TABS: 0.5000 mg | ORAL_TABLET | Freq: Three times a day (TID) | ORAL | 0 refills | Status: DC | PRN

## 2016-10-18 MED ORDER — TRAMADOL HCL 50 MG PO TABS: 50.0000 mg | ORAL_TABLET | Freq: Four times a day (QID) | ORAL | 0 refills | Status: DC | PRN

## 2016-10-18 NOTE — Progress Notes (Signed)
Pt wanted labs drawn through arm. Porsche Cates LPN

## 2016-10-25 ENCOUNTER — Telehealth: Payer: Self-pay | Admitting: *Deleted

## 2016-10-25 ENCOUNTER — Ambulatory Visit (HOSPITAL_BASED_OUTPATIENT_CLINIC_OR_DEPARTMENT_OTHER): Payer: No Typology Code available for payment source | Admitting: Nurse Practitioner

## 2016-10-25 VITALS — BP 141/83 | HR 89 | Temp 98.0°F | Resp 18 | Ht 65.5 in | Wt 124.9 lb

## 2016-10-25 DIAGNOSIS — C50212 Malignant neoplasm of upper-inner quadrant of left female breast: Secondary | ICD-10-CM

## 2016-10-25 DIAGNOSIS — Z170 Estrogen receptor positive status [ER+]: Secondary | ICD-10-CM | POA: Diagnosis not present

## 2016-10-25 DIAGNOSIS — R21 Rash and other nonspecific skin eruption: Secondary | ICD-10-CM | POA: Diagnosis not present

## 2016-10-25 MED ORDER — DIPHENHYDRAMINE HCL 25 MG PO TABS: 25.0000 mg | ORAL_TABLET | Freq: Four times a day (QID) | ORAL | 2 refills | Status: DC | PRN

## 2016-10-25 MED ORDER — FAMOTIDINE 20 MG PO TABS: 20.0000 mg | ORAL_TABLET | Freq: Two times a day (BID) | ORAL | 2 refills | Status: DC | PRN

## 2016-10-25 MED ORDER — CLINDAMYCIN PHOSPHATE 1 % EX GEL: CUTANEOUS | 1 refills | Status: DC

## 2016-10-25 NOTE — Telephone Encounter (Signed)
Received call from patient complaining of a rash on her face that is uncomfortable and kind of burns per patient.  She will see Selena Lesser at 230pm to evaluate.

## 2016-10-26 ENCOUNTER — Encounter: Payer: Self-pay | Admitting: Nurse Practitioner

## 2016-10-26 DIAGNOSIS — R21 Rash and other nonspecific skin eruption: Secondary | ICD-10-CM | POA: Insufficient documentation

## 2016-10-26 NOTE — Assessment & Plan Note (Signed)
Patient received her first cycle of Taxotere/carboplatin/Herceptin/Perjeta chemotherapy regimen on 10/11/2016.  She also takes letrozole oral therapy as well.  She is scheduled to return for labs, flush, and her next infusion on 11/01/2016.

## 2016-10-26 NOTE — Progress Notes (Signed)
SYMPTOM MANAGEMENT [facility]    Chief Complaint: Rash  HPI:  Miranda Woods 61 y.o. female diagnosed with breast cancer.  Currently undergoing Taxotere/carboplatin/Herceptin/Perjeta chemotherapy regimen; as well as Letrozole oral therapy.      Malignant neoplasm of upper-inner quadrant of left breast in female, estrogen receptor positive ([place])   09/14/2016 Initial Diagnosis    Palpable left breast mass for 2 months, ultrasound at 9:00: 4.7 x 3.6 x 3.3 cm, axilla negative: U/S Bx: Grade 2 IDC ER 95%, PR 50%, HER-2 positive ratio 6.17, Ki-67 20%, T2 N0 stage IIA clinical stage      09/27/2016 Breast MRI    Left breast: 3.5 cm irregular necrotic mass; left axillary lymph node 1.2 cm; right breast 0.6 cm mass at 6:00 position 0.5 cm mass right breast lower inner quadrant        Review of Systems  Skin: Positive for itching and rash.  All other systems reviewed and are negative.   Past Medical History:  Diagnosis Date  . ADHD (attention deficit hyperactivity disorder)   . Fibrocystic breast disease     Past Surgical History:  Procedure Laterality Date  . APPENDECTOMY    . IR GENERIC HISTORICAL  10/10/2016   IR US GUIDE VASC ACCESS RIGHT 10/10/2016 Jacqulynn Cadet, MD [facility]  . IR GENERIC HISTORICAL  10/10/2016   IR FLUORO GUIDE PORT INSERTION RIGHT 10/10/2016 Jacqulynn Cadet, MD [facility]  . ORIF WRIST FRACTURE      has ADHD (attention deficit hyperactivity disorder); Fibrocystic breast disease; Malignant neoplasm of upper-inner quadrant of left breast in female, estrogen receptor positive ([place]); Port catheter in place; and Rash on her problem list.    is allergic to codeine.  Allergies as of 10/25/2016      Reactions   Codeine Hives, Itching   Tolerates hydrocodone      Medication List       Accurate as of 10/25/16 11:59 PM. Always use your most recent med list.          clindamycin 1 % gel Commonly known as:  CLINDAGEL Apply to rash BID PRN.     dexamethasone 4 MG tablet Commonly known as:  DECADRON Take 0.5 tablets (2 mg total) by mouth daily. 1/2 tab day before Taxotere. Then again the day after chemo for 1 day   diphenhydrAMINE 25 MG tablet Commonly known as:  BENADRYL Take 1 tablet (25 mg total) by mouth every 6 (six) hours as needed for itching or allergies.   famotidine 20 MG tablet Commonly known as:  PEPCID Take 1 tablet (20 mg total) by mouth 2 (two) times daily as needed for heartburn or indigestion.   hydrOXYzine 10 MG tablet Commonly known as:  ATARAX/VISTARIL Take 5 mg by mouth as needed.   letrozole 2.5 MG tablet Commonly known as:  FEMARA Take 1 tablet (2.5 mg total) by mouth daily.   lidocaine-prilocaine cream Commonly known as:  EMLA Apply to affected area once   LORazepam 0.5 MG tablet Commonly known as:  ATIVAN Take 1-2 tablets (0.5-1 mg total) by mouth every 8 (eight) hours as needed for anxiety.   methylphenidate 5 MG tablet Commonly known as:  RITALIN Take 5 mg by mouth 2 (two) times daily as needed.   ondansetron 8 MG tablet Commonly known as:  ZOFRAN Take 1 tablet (8 mg total) by mouth 2 (two) times daily as needed for refractory nausea / vomiting. Start on day 3 after chemo.   prochlorperazine 10 MG  tablet Commonly known as:  COMPAZINE Take 1 tablet (10 mg total) by mouth every 6 (six) hours as needed (Nausea or vomiting).   traMADol 50 MG tablet Commonly known as:  ULTRAM Take 1 tablet (50 mg total) by mouth every 6 (six) hours as needed.   UNABLE TO FIND Bone broth        PHYSICAL EXAMINATION  Oncology Vitals 10/25/2016 10/18/2016  Height 166 cm 166 cm  Weight 56.654 kg 56.972 kg  Weight (lbs) 124 lbs 14 oz 125 lbs 10 oz  BMI (kg/m2) 20.47 kg/m2 20.58 kg/m2  Temp 98 97.7  Pulse 89 87  Resp 18 18  SpO2 100 97  BSA (m2) 1.62 m2 1.62 m2   BP Readings from Last 2 Encounters:  10/25/16 (!) 141/83  10/18/16 (!) 148/69    Physical Exam  Constitutional: She is oriented  to person, place, and time and well-developed, well-nourished, and in no distress.  HENT:  Head: Normocephalic and atraumatic.  Eyes: Conjunctivae and EOM are normal. Pupils are equal, round, and reactive to light.  Neck: Normal range of motion.  Pulmonary/Chest: Effort normal. No respiratory distress.  Musculoskeletal: Normal range of motion.  Neurological: She is alert and oriented to person, place, and time. Gait normal.  Skin: Skin is warm and dry. Rash noted. There is erythema.  On exam.  Patient has a fine red rash to her entire face; with some of the lesions appearing pustular.    Psychiatric: Affect normal.  Nursing note and vitals reviewed.   LABORATORY DATA:. No visits with results within 3 Day(s) from this visit.  Latest known visit with results is:  Appointment on 10/18/2016  Component Date Value Ref Range Status  . WBC 10/18/2016 20.0* 3.9 - 10.3 10e3/uL Final  . NEUT# 10/18/2016 15.6* 1.5 - 6.5 10e3/uL Final  . HGB 10/18/2016 13.2  11.6 - 15.9 g/dL Final  . HCT 10/18/2016 39.1  34.8 - 46.6 % Final  . Platelets 10/18/2016 176  145 - 400 10e3/uL Final  . MCV 10/18/2016 91.2  79.5 - 101.0 fL Final  . MCH 10/18/2016 30.7  25.1 - 34.0 pg Final  . MCHC 10/18/2016 33.7  31.5 - 36.0 g/dL Final  . RBC 10/18/2016 4.29  3.70 - 5.45 10e6/uL Final  . RDW 10/18/2016 12.2  11.2 - 14.5 % Final  . lymph# 10/18/2016 2.3  0.9 - 3.3 10e3/uL Final  . MONO# 10/18/2016 2.0* 0.1 - 0.9 10e3/uL Final  . Eosinophils Absolute 10/18/2016 0.0  0.0 - 0.5 10e3/uL Final  . Basophils Absolute 10/18/2016 0.1  0.0 - 0.1 10e3/uL Final  . NEUT% 10/18/2016 77.9* 38.4 - 76.8 % Final  . LYMPH% 10/18/2016 11.7* 14.0 - 49.7 % Final  . MONO% 10/18/2016 9.9  0.0 - 14.0 % Final  . EOS% 10/18/2016 0.1  0.0 - 7.0 % Final  . BASO% 10/18/2016 0.4  0.0 - 2.0 % Final  . Sodium 10/18/2016 138  136 - 145 mEq/L Final  . Potassium 10/18/2016 4.0  3.5 - 5.1 mEq/L Final  . Chloride 10/18/2016 104  98 - 109 mEq/L Final   . CO2 10/18/2016 26  22 - 29 mEq/L Final  . Glucose 10/18/2016 94  70 - 140 mg/dl Final  . BUN 10/18/2016 6.3* 7.0 - 26.0 mg/dL Final  . Creatinine 10/18/2016 0.7  0.6 - 1.1 mg/dL Final  . Total Bilirubin 10/18/2016 0.31  0.20 - 1.20 mg/dL Final  . Alkaline Phosphatase 10/18/2016 84  40 - 150 U/L Final  .  AST 10/18/2016 26  5 - 34 U/L Final  . ALT 10/18/2016 15  0 - 55 U/L Final  . Total Protein 10/18/2016 6.7  6.4 - 8.3 g/dL Final  . Albumin 10/18/2016 3.8  3.5 - 5.0 g/dL Final  . Calcium 10/18/2016 9.3  8.4 - 10.4 mg/dL Final  . Anion Gap 10/18/2016 8  3 - 11 mEq/L Final  . EGFR 10/18/2016 >90  >90 ml/min/1.73 m2 Final    RADIOGRAPHIC STUDIES: No results found.  ASSESSMENT/PLAN:    Rash Patient received her first cycle of Taxotere/Proplast/Herceptin/Perjeta chemotherapy on 10/11/2016.  Within the past few days she has developed a fine red rash to her face only.  She states that the rash is slightly pruritic as well.  She denies any other allergic-type symptoms whatsoever.  On exam.  Patient has a fine red rash to her entire face; with some of the lesions appearing pustular.  Patient was advised to try Benadryl and Pepcid for treatment of the rash.  She may also try some hydrocortisone sparingly to her face as well.  Also, will prescribe clindamycin gel to apply to her face as well.  Review all findings with Dr. Lindi Adie as well.  Patient was advised to call/return of electrical to the emergency department for any worsening symptoms whatsoever.  Malignant neoplasm of upper-inner quadrant of left breast in female, estrogen receptor positive ([place]) Patient received her first cycle of Taxotere/carboplatin/Herceptin/Perjeta chemotherapy regimen on 10/11/2016.  She also takes letrozole oral therapy as well.  She is scheduled to return for labs, flush, and her next infusion on 11/01/2016.   Patient stated understanding of all instructions; and was in agreement with this plan of care. The  patient knows to call the clinic with any problems, questions or concerns.   Total time spent with patient was 15 minutes;  with greater than 75 percent of that time spent in face to face counseling regarding patient's symptoms,  and coordination of care and follow up.  Disclaimer:This dictation was prepared with Dragon/digital dictation along with [employer]. Any transcriptional errors that result from this process are unintentional.  Drue Second, NP 10/26/2016

## 2016-10-26 NOTE — Assessment & Plan Note (Signed)
Patient received her first cycle of Taxotere/Proplast/Herceptin/Perjeta chemotherapy on 10/11/2016.  Within the past few days she has developed a fine red rash to her face only.  She states that the rash is slightly pruritic as well.  She denies any other allergic-type symptoms whatsoever.  On exam.  Patient has a fine red rash to her entire face; with some of the lesions appearing pustular.  Patient was advised to try Benadryl and Pepcid for treatment of the rash.  She may also try some hydrocortisone sparingly to her face as well.  Also, will prescribe clindamycin gel to apply to her face as well.  Review all findings with Dr. Lindi Adie as well.  Patient was advised to call/return of electrical to the emergency department for any worsening symptoms whatsoever.

## 2016-11-01 ENCOUNTER — Ambulatory Visit: Payer: No Typology Code available for payment source

## 2016-11-01 ENCOUNTER — Other Ambulatory Visit: Payer: Self-pay

## 2016-11-01 ENCOUNTER — Ambulatory Visit (HOSPITAL_BASED_OUTPATIENT_CLINIC_OR_DEPARTMENT_OTHER): Payer: No Typology Code available for payment source

## 2016-11-01 ENCOUNTER — Other Ambulatory Visit: Payer: No Typology Code available for payment source

## 2016-11-01 ENCOUNTER — Other Ambulatory Visit (HOSPITAL_BASED_OUTPATIENT_CLINIC_OR_DEPARTMENT_OTHER): Payer: No Typology Code available for payment source

## 2016-11-01 VITALS — BP 106/71 | HR 67 | Temp 98.3°F | Resp 17

## 2016-11-01 DIAGNOSIS — Z170 Estrogen receptor positive status [ER+]: Principal | ICD-10-CM

## 2016-11-01 DIAGNOSIS — Z5111 Encounter for antineoplastic chemotherapy: Secondary | ICD-10-CM

## 2016-11-01 DIAGNOSIS — Z5112 Encounter for antineoplastic immunotherapy: Secondary | ICD-10-CM | POA: Diagnosis not present

## 2016-11-01 DIAGNOSIS — C50212 Malignant neoplasm of upper-inner quadrant of left female breast: Secondary | ICD-10-CM | POA: Diagnosis not present

## 2016-11-01 DIAGNOSIS — Z5189 Encounter for other specified aftercare: Secondary | ICD-10-CM | POA: Diagnosis not present

## 2016-11-01 DIAGNOSIS — Z95828 Presence of other vascular implants and grafts: Secondary | ICD-10-CM

## 2016-11-01 LAB — COMPREHENSIVE METABOLIC PANEL
ALBUMIN: 3.6 g/dL (ref 3.5–5.0)
ALK PHOS: 64 U/L (ref 40–150)
ALT: 21 U/L (ref 0–55)
ANION GAP: 7 meq/L (ref 3–11)
AST: 16 U/L (ref 5–34)
BUN: 13.5 mg/dL (ref 7.0–26.0)
CALCIUM: 9 mg/dL (ref 8.4–10.4)
CO2: 26 mEq/L (ref 22–29)
Chloride: 108 mEq/L (ref 98–109)
Creatinine: 0.6 mg/dL (ref 0.6–1.1)
Glucose: 92 mg/dl (ref 70–140)
POTASSIUM: 3.6 meq/L (ref 3.5–5.1)
Sodium: 141 mEq/L (ref 136–145)
Total Bilirubin: 0.49 mg/dL (ref 0.20–1.20)
Total Protein: 6 g/dL — ABNORMAL LOW (ref 6.4–8.3)

## 2016-11-01 LAB — CBC WITH DIFFERENTIAL/PLATELET
BASO%: 1 % (ref 0.0–2.0)
BASOS ABS: 0 10*3/uL (ref 0.0–0.1)
EOS ABS: 0 10*3/uL (ref 0.0–0.5)
EOS%: 0.3 % (ref 0.0–7.0)
HEMATOCRIT: 32.4 % — AB (ref 34.8–46.6)
HEMOGLOBIN: 11.1 g/dL — AB (ref 11.6–15.9)
LYMPH%: 29.1 % (ref 14.0–49.7)
MCH: 31.6 pg (ref 25.1–34.0)
MCHC: 34.3 g/dL (ref 31.5–36.0)
MCV: 92.1 fL (ref 79.5–101.0)
MONO#: 0.6 10*3/uL (ref 0.1–0.9)
MONO%: 13.6 % (ref 0.0–14.0)
NEUT#: 2.3 10*3/uL (ref 1.5–6.5)
NEUT%: 56 % (ref 38.4–76.8)
Platelets: 260 10*3/uL (ref 145–400)
RBC: 3.52 10*6/uL — ABNORMAL LOW (ref 3.70–5.45)
RDW: 12.9 % (ref 11.2–14.5)
WBC: 4.2 10*3/uL (ref 3.9–10.3)
lymph#: 1.2 10*3/uL (ref 0.9–3.3)

## 2016-11-01 MED ORDER — SODIUM CHLORIDE 0.9 % IV SOLN
6.0000 mg/kg | Freq: Once | INTRAVENOUS | Status: AC
  Administered 2016-11-01: 357 mg via INTRAVENOUS
  Filled 2016-11-01: qty 17

## 2016-11-01 MED ORDER — DOCETAXEL CHEMO INJECTION 160 MG/16ML
75.0000 mg/m2 | Freq: Once | INTRAVENOUS | Status: AC
  Administered 2016-11-01: 120 mg via INTRAVENOUS
  Filled 2016-11-01: qty 12

## 2016-11-01 MED ORDER — SODIUM CHLORIDE 0.9 % IV SOLN
570.0000 mg | Freq: Once | INTRAVENOUS | Status: AC
  Administered 2016-11-01: 570 mg via INTRAVENOUS
  Filled 2016-11-01: qty 57

## 2016-11-01 MED ORDER — SODIUM CHLORIDE 0.9% FLUSH
10.0000 mL | INTRAVENOUS | Status: DC | PRN
  Administered 2016-11-01: 10 mL
  Filled 2016-11-01: qty 10

## 2016-11-01 MED ORDER — PEGFILGRASTIM 6 MG/0.6ML ~~LOC~~ PSKT
6.0000 mg | PREFILLED_SYRINGE | Freq: Once | SUBCUTANEOUS | Status: AC
  Administered 2016-11-01: 6 mg via SUBCUTANEOUS
  Filled 2016-11-01: qty 0.6

## 2016-11-01 MED ORDER — ACETAMINOPHEN 325 MG PO TABS
650.0000 mg | ORAL_TABLET | Freq: Once | ORAL | Status: AC
Start: 2016-11-01 — End: 2016-11-01
  Administered 2016-11-01: 650 mg via ORAL

## 2016-11-01 MED ORDER — HEPARIN SOD (PORK) LOCK FLUSH 100 UNIT/ML IV SOLN
500.0000 [IU] | Freq: Once | INTRAVENOUS | Status: AC | PRN
  Administered 2016-11-01: 500 [IU]
  Filled 2016-11-01: qty 5

## 2016-11-01 MED ORDER — SODIUM CHLORIDE 0.9 % IV SOLN
420.0000 mg | Freq: Once | INTRAVENOUS | Status: AC
  Administered 2016-11-01: 420 mg via INTRAVENOUS
  Filled 2016-11-01: qty 14

## 2016-11-01 MED ORDER — PALONOSETRON HCL INJECTION 0.25 MG/5ML
INTRAVENOUS | Status: AC
Start: 2016-11-01 — End: 2016-11-01
  Filled 2016-11-01: qty 5

## 2016-11-01 MED ORDER — DEXAMETHASONE SODIUM PHOSPHATE 10 MG/ML IJ SOLN
10.0000 mg | Freq: Once | INTRAMUSCULAR | Status: AC
  Administered 2016-11-01: 10 mg via INTRAVENOUS

## 2016-11-01 MED ORDER — SODIUM CHLORIDE 0.9 % IV SOLN: Freq: Once | INTRAVENOUS | Status: DC

## 2016-11-01 MED ORDER — ACETAMINOPHEN 325 MG PO TABS
ORAL_TABLET | ORAL | Status: AC
  Filled 2016-11-01: qty 2

## 2016-11-01 MED ORDER — DIPHENHYDRAMINE HCL 25 MG PO CAPS
ORAL_CAPSULE | ORAL | Status: AC
  Filled 2016-11-01: qty 2

## 2016-11-01 MED ORDER — PALONOSETRON HCL INJECTION 0.25 MG/5ML
0.2500 mg | Freq: Once | INTRAVENOUS | Status: AC
  Administered 2016-11-01: 0.25 mg via INTRAVENOUS

## 2016-11-01 MED ORDER — SODIUM CHLORIDE 0.9% FLUSH
10.0000 mL | INTRAVENOUS | Status: DC | PRN
  Administered 2016-11-01: 10 mL via INTRAVENOUS
  Filled 2016-11-01: qty 10

## 2016-11-01 MED ORDER — SODIUM CHLORIDE 0.9 % IV SOLN
Freq: Once | INTRAVENOUS | Status: AC
  Administered 2016-11-01: 11:00:00 via INTRAVENOUS

## 2016-11-01 MED ORDER — SODIUM CHLORIDE 0.9 % IV SOLN: 10.0000 mg | Freq: Once | INTRAVENOUS | Status: DC

## 2016-11-01 MED ORDER — DIPHENHYDRAMINE HCL 25 MG PO CAPS
50.0000 mg | ORAL_CAPSULE | Freq: Once | ORAL | Status: AC
  Administered 2016-11-01: 50 mg via ORAL

## 2016-11-01 MED ORDER — DEXAMETHASONE SODIUM PHOSPHATE 10 MG/ML IJ SOLN
INTRAMUSCULAR | Status: AC
  Filled 2016-11-01: qty 1

## 2016-11-01 NOTE — Patient Instructions (Signed)
[facility] Discharge Instructions for Patients Receiving Chemotherapy  Today you received the following chemotherapy agents:  Taxol, Carboplatin, Herceptin, Perjeta  To help prevent nausea and vomiting after your treatment, we encourage you to take your nausea medication as prescribed.   If you develop nausea and vomiting that is not controlled by your nausea medication, call the clinic.   BELOW ARE SYMPTOMS THAT SHOULD BE REPORTED IMMEDIATELY:  *FEVER GREATER THAN 100.5 F  *CHILLS WITH OR WITHOUT FEVER  NAUSEA AND VOMITING THAT IS NOT CONTROLLED WITH YOUR NAUSEA MEDICATION  *UNUSUAL SHORTNESS OF BREATH  *UNUSUAL BRUISING OR BLEEDING  TENDERNESS IN MOUTH AND THROAT WITH OR WITHOUT PRESENCE OF ULCERS  *URINARY PROBLEMS  *BOWEL PROBLEMS  UNUSUAL RASH Items with * indicate a potential emergency and should be followed up as soon as possible.  Feel free to call the clinic you have any questions or concerns. The clinic phone number is [phone].  Please show the [place] at check-in to the Emergency Department and triage nurse.

## 2016-11-01 NOTE — Patient Instructions (Signed)
Implanted Port Home Guide An implanted port is a type of central line that is placed under the skin. Central lines are used to provide IV access when treatment or nutrition needs to be given through a person's veins. Implanted ports are used for long-term IV access. An implanted port may be placed because:  You need IV medicine that would be irritating to the small veins in your hands or arms.  You need long-term IV medicines, such as antibiotics.  You need IV nutrition for a long period.  You need frequent blood draws for lab tests.  You need dialysis.  Implanted ports are usually placed in the chest area, but they can also be placed in the upper arm, the abdomen, or the leg. An implanted port has two main parts:  Reservoir. The reservoir is round and will appear as a small, raised area under your skin. The reservoir is the part where a needle is inserted to give medicines or draw blood.  Catheter. The catheter is a thin, flexible tube that extends from the reservoir. The catheter is placed into a large vein. Medicine that is inserted into the reservoir goes into the catheter and then into the vein.  How will I care for my incision site? Do not get the incision site wet. Bathe or shower as directed by your health care provider. How is my port accessed? Special steps must be taken to access the port:  Before the port is accessed, a numbing cream can be placed on the skin. This helps numb the skin over the port site.  Your health care provider uses a sterile technique to access the port. ? Your health care provider must put on a mask and sterile gloves. ? The skin over your port is cleaned carefully with an antiseptic and allowed to dry. ? The port is gently pinched between sterile gloves, and a needle is inserted into the port.  Only "non-coring" port needles should be used to access the port. Once the port is accessed, a blood return should be checked. This helps ensure that the port  is in the vein and is not clogged.  If your port needs to remain accessed for a constant infusion, a clear (transparent) bandage will be placed over the needle site. The bandage and needle will need to be changed every week, or as directed by your health care provider.  Keep the bandage covering the needle clean and dry. Do not get it wet. Follow your health care provider's instructions on how to take a shower or bath while the port is accessed.  If your port does not need to stay accessed, no bandage is needed over the port.  What is flushing? Flushing helps keep the port from getting clogged. Follow your health care provider's instructions on how and when to flush the port. Ports are usually flushed with saline solution or a medicine called heparin. The need for flushing will depend on how the port is used.  If the port is used for intermittent medicines or blood draws, the port will need to be flushed: ? After medicines have been given. ? After blood has been drawn. ? As part of routine maintenance.  If a constant infusion is running, the port may not need to be flushed.  How long will my port stay implanted? The port can stay in for as long as your health care provider thinks it is needed. When it is time for the port to come out, surgery will be   done to remove it. The procedure is similar to the one performed when the port was put in. When should I seek immediate medical care? When you have an implanted port, you should seek immediate medical care if:  You notice a bad smell coming from the incision site.  You have swelling, redness, or drainage at the incision site.  You have more swelling or pain at the port site or the surrounding area.  You have a fever that is not controlled with medicine.  This information is not intended to replace advice given to you by your health care provider. Make sure you discuss any questions you have with your health care provider. Document  Released: 07/17/2005 Document Revised: 12/23/2015 Document Reviewed: 03/24/2013 Elsevier Interactive Patient Education  2017 [employer]  

## 2016-11-08 ENCOUNTER — Encounter: Payer: Self-pay | Admitting: Adult Health

## 2016-11-08 ENCOUNTER — Ambulatory Visit (HOSPITAL_BASED_OUTPATIENT_CLINIC_OR_DEPARTMENT_OTHER): Payer: No Typology Code available for payment source | Admitting: Adult Health

## 2016-11-08 ENCOUNTER — Other Ambulatory Visit (HOSPITAL_BASED_OUTPATIENT_CLINIC_OR_DEPARTMENT_OTHER): Payer: No Typology Code available for payment source

## 2016-11-08 VITALS — BP 132/67 | HR 77 | Temp 97.7°F | Resp 18 | Ht 65.5 in | Wt 125.2 lb

## 2016-11-08 DIAGNOSIS — C50212 Malignant neoplasm of upper-inner quadrant of left female breast: Secondary | ICD-10-CM

## 2016-11-08 DIAGNOSIS — Z170 Estrogen receptor positive status [ER+]: Secondary | ICD-10-CM | POA: Diagnosis not present

## 2016-11-08 LAB — CBC WITH DIFFERENTIAL/PLATELET
BASO%: 0.5 % (ref 0.0–2.0)
BASOS ABS: 0.1 10*3/uL (ref 0.0–0.1)
EOS%: 0.2 % (ref 0.0–7.0)
Eosinophils Absolute: 0 10*3/uL (ref 0.0–0.5)
HCT: 36.8 % (ref 34.8–46.6)
HEMOGLOBIN: 12.4 g/dL (ref 11.6–15.9)
LYMPH%: 16.4 % (ref 14.0–49.7)
MCH: 31.3 pg (ref 25.1–34.0)
MCHC: 33.7 g/dL (ref 31.5–36.0)
MCV: 93.1 fL (ref 79.5–101.0)
MONO#: 2 10*3/uL — ABNORMAL HIGH (ref 0.1–0.9)
MONO%: 16.1 % — ABNORMAL HIGH (ref 0.0–14.0)
NEUT#: 8.5 10*3/uL — ABNORMAL HIGH (ref 1.5–6.5)
NEUT%: 66.8 % (ref 38.4–76.8)
Platelets: 165 10*3/uL (ref 145–400)
RBC: 3.96 10*6/uL (ref 3.70–5.45)
RDW: 13.2 % (ref 11.2–14.5)
WBC: 12.7 10*3/uL — ABNORMAL HIGH (ref 3.9–10.3)
lymph#: 2.1 10*3/uL (ref 0.9–3.3)

## 2016-11-08 LAB — COMPREHENSIVE METABOLIC PANEL
ALK PHOS: 86 U/L (ref 40–150)
ALT: 28 U/L (ref 0–55)
AST: 21 U/L (ref 5–34)
Albumin: 4 g/dL (ref 3.5–5.0)
Anion Gap: 9 mEq/L (ref 3–11)
BILIRUBIN TOTAL: 0.3 mg/dL (ref 0.20–1.20)
BUN: 11.7 mg/dL (ref 7.0–26.0)
CO2: 26 mEq/L (ref 22–29)
CREATININE: 0.6 mg/dL (ref 0.6–1.1)
Calcium: 9.6 mg/dL (ref 8.4–10.4)
Chloride: 104 mEq/L (ref 98–109)
Glucose: 91 mg/dl (ref 70–140)
Potassium: 4.1 mEq/L (ref 3.5–5.1)
Sodium: 139 mEq/L (ref 136–145)
Total Protein: 6.7 g/dL (ref 6.4–8.3)

## 2016-11-08 MED ORDER — LORAZEPAM 0.5 MG PO TABS: 0.5000 mg | ORAL_TABLET | Freq: Three times a day (TID) | ORAL | 0 refills | Status: DC | PRN

## 2016-11-08 NOTE — Progress Notes (Signed)
Patient Care Team: No Pcp Per Patient as PCP - General (General Practice) [name], MD as Consulting Physician (General Surgery) Nicholas Lose, MD as Consulting Physician (Hematology and Oncology) Eppie Gibson, MD as Attending Physician (Radiation Oncology) Gardenia Phlegm, NP as Nurse Practitioner (Hematology and Oncology)  DIAGNOSIS:  Encounter Diagnosis  Name Primary?  . Malignant neoplasm of upper-inner quadrant of left breast in female, estrogen receptor positive ([place]) Yes    SUMMARY OF ONCOLOGIC HISTORY:   Malignant neoplasm of upper-inner quadrant of left breast in female, estrogen receptor positive ([place])   09/14/2016 Initial Diagnosis    Palpable left breast mass for 2 months, ultrasound at 9:00: 4.7 x 3.6 x 3.3 cm, axilla negative: U/S Bx: Grade 2 IDC ER 95%, PR 50%, HER-2 positive ratio 6.17, Ki-67 20%, T2 N0 stage IIA clinical stage      09/27/2016 Breast MRI    Left breast: 3.5 cm irregular necrotic mass; left axillary lymph node 1.2 cm; right breast 0.6 cm mass at 6:00 position 0.5 cm mass right breast lower inner quadrant       10/11/2016 -  Neo-Adjuvant Chemotherapy    Taxotere, Carboplatin, Herceptin, Perjeta x 6 cycles followed by Herceptin and Perjeta maintenance for one year       CHIEF COMPLIANT: Neo-adjuvant TCHP cycle 2 day 8  INTERVAL HISTORY: Miranda Woods is a 61 year old with above-mentioned history of newly diagnosed left breast cancer who is here for evaluation after receiving her second cycle of neoadjuvant chemotherapy with Taxotere, Carboplatin, Herceptin, Perjeta. She is doing well.  She has been having difficulty sleeping and has been taking 2 benadryl and 1 lorazepam at night.  She has had some subsequent constipation and has been using miralax and prunes which have helped.  She denies any other difficulties.  She had a rash after cycle 1 that resolved after two applications of Clindagel. She did not have any bone pain following  neulasta requiring her to use the tramadol I prescribed.  REVIEW OF SYSTEMS:   Review of Systems  Constitutional: Positive for malaise/fatigue. Negative for chills, fever and weight loss.  HENT: Negative for hearing loss and tinnitus.   Eyes: Negative for blurred vision and double vision.  Respiratory: Negative for cough and shortness of breath.   Cardiovascular: Negative for chest pain, palpitations and leg swelling.  Gastrointestinal: Positive for constipation. Negative for abdominal pain, blood in stool, diarrhea, heartburn, melena, nausea and vomiting.  Skin: Negative for rash.  Neurological: Negative for dizziness, tingling, weakness and headaches.     All other systems were reviewed with the patient and are negative.  I have reviewed the past medical history, past surgical history, social history and family history with the patient and they are unchanged from previous note.  ALLERGIES:  is allergic to codeine.  MEDICATIONS:  Current Outpatient Prescriptions  Medication Sig Dispense Refill  . clindamycin (CLINDAGEL) 1 % gel Apply to rash BID PRN. 30 g 1  . dexamethasone (DECADRON) 4 MG tablet Take 0.5 tablets (2 mg total) by mouth daily. 1/2 tab day before Taxotere. Then again the day after chemo for 1 day 10 tablet 0  . diphenhydrAMINE (BENADRYL) 25 MG tablet Take 1 tablet (25 mg total) by mouth every 6 (six) hours as needed for itching or allergies. 30 tablet 2  . famotidine (PEPCID) 20 MG tablet Take 1 tablet (20 mg total) by mouth 2 (two) times daily as needed for heartburn or indigestion. 30 tablet 2  . hydrOXYzine (ATARAX/VISTARIL)  10 MG tablet Take 5 mg by mouth as needed.    Marland Kitchen letrozole (FEMARA) 2.5 MG tablet Take 1 tablet (2.5 mg total) by mouth daily. 90 tablet 3  . lidocaine-prilocaine (EMLA) cream Apply to affected area once 30 g 3  . LORazepam (ATIVAN) 0.5 MG tablet Take 1-2 tablets (0.5-1 mg total) by mouth every 8 (eight) hours as needed for anxiety or sleep. 60  tablet 0  . methylphenidate (RITALIN) 5 MG tablet Take 5 mg by mouth 2 (two) times daily as needed.     . ondansetron (ZOFRAN) 8 MG tablet Take 1 tablet (8 mg total) by mouth 2 (two) times daily as needed for refractory nausea / vomiting. Start on day 3 after chemo. (Patient not taking: Reported on 10/25/2016) 30 tablet 1  . prochlorperazine (COMPAZINE) 10 MG tablet Take 1 tablet (10 mg total) by mouth every 6 (six) hours as needed (Nausea or vomiting). (Patient not taking: Reported on 10/25/2016) 30 tablet 1  . traMADol (ULTRAM) 50 MG tablet Take 1 tablet (50 mg total) by mouth every 6 (six) hours as needed. (Patient not taking: Reported on 10/25/2016) 30 tablet 0  . UNABLE TO FIND Bone broth     No current facility-administered medications for this visit.     PHYSICAL EXAMINATION: ECOG PERFORMANCE STATUS: 1 - Symptomatic but completely ambulatory  Vitals:   11/08/16 0952  BP: 132/67  Pulse: 77  Resp: 18  Temp: 97.7 F (36.5 C)   Filed Weights   11/08/16 0952  Weight: 125 lb 3.2 oz (56.8 kg)  GENERAL: Patient is a well appearing female in no acute distress HEENT:  Sclerae anicteric.  PERRL, Oropharynx clear and moist. No ulcerations or evidence of oropharyngeal candidiasis. Neck is supple.  NODES:  No cervical, supraclavicular, or axillary lymphadenopathy palpated.  BREAST EXAM:  Deferred. LUNGS:  Clear to auscultation bilaterally.  No wheezes or rhonchi. HEART:  Regular rate and rhythm. No murmur appreciated. ABDOMEN:  Soft, nontender.  Positive, normoactive bowel sounds. No organomegaly palpated. MSK:  No focal spinal tenderness to palpation. Full range of motion bilaterally in the upper extremities. EXTREMITIES:  No peripheral edema.   SKIN:  Clear with no obvious rashes or skin changes. No nail dyscrasia. NEURO:  Nonfocal. Well oriented.  Appropriate affect.     LABORATORY DATA:  I have reviewed the data as listed   Chemistry      Component Value Date/Time   NA 139  11/08/2016 0916   K 4.1 11/08/2016 0916   CL 101 11/30/2011 1649   CO2 26 11/08/2016 0916   BUN 11.7 11/08/2016 0916   CREATININE 0.6 11/08/2016 0916      Component Value Date/Time   CALCIUM 9.6 11/08/2016 0916   ALKPHOS 86 11/08/2016 0916   AST 21 11/08/2016 0916   ALT 28 11/08/2016 0916   BILITOT 0.30 11/08/2016 0916       Lab Results  Component Value Date   WBC 12.7 (H) 11/08/2016   HGB 12.4 11/08/2016   HCT 36.8 11/08/2016   MCV 93.1 11/08/2016   PLT 165 11/08/2016   NEUTROABS 8.5 (H) 11/08/2016    ASSESSMENT & PLAN:  Malignant neoplasm of upper-inner quadrant of left breast in female, estrogen receptor positive ([facility]) Palpable left breast mass for 2 months, ultrasound at 9:00: 4.7 x 3.6 x 3.3 cm, axilla negative: U/S Bx: Grade 2 IDC ER 95%, PR 50%, HER-2 positive ratio 6.17, Ki-67 20%,   MRI breast 09/28/2016: Left breast: 3.5 cm irregular  necrotic mass; left axillary lymph node 1.2 cm; right breast 0.6 cm mass at 6:00 position 0.5 cm mass right breast lower inner quadrant  T2 N0 stage IIA clinical stage (could be stage IIB if the lymph node in the left axilla is positive)  Recommendation based on multidisciplinary tumor board: 1. Neoadjuvant chemotherapy with TCH Perjeta 6 cycles followed by Herceptin and Perjeta maintenance for 1 year 2. Followed by breast conserving surgery if possible with sentinel lymph node study 3. Followed by adjuvant radiation therapy if patient had lumpectomy 4. Followed by adjuvant antiestrogen therapy  Plan:  Miranda Woods is doing well following treatment.  Her CBC is stable and I reviewed this with her in detail.  Other labs are pending.  She tolerated treatment well.  She will let us know if she has any further difficulties.  She has some questions about the Letrozole and will discuss these at her next appointment with Dr. Lindi Adie.  I recommended Cetaphil lotion for her to use for any dry skin she may experience.    The patient has a good  understanding of the overall plan. she agrees with it. she will call with any problems that may develop before the next visit here.  A total of (30) minutes of face-to-face time was spent with this patient with greater than 50% of that time in counseling and care-coordination.    Scot Dock, NP 11/08/16

## 2016-11-21 NOTE — Assessment & Plan Note (Signed)
Palpable left breast mass for 2 months, ultrasound at 9:00: 4.7 x 3.6 x 3.3 cm, axilla negative: U/S Bx: Grade 2 IDC ER 95%, PR 50%, HER-2 positive ratio 6.17, Ki-67 20%,   MRI breast 09/28/2016: Left breast: 3.5 cm irregular necrotic mass; left axillary lymph node 1.2 cm; right breast 0.6 cm mass at 6:00 position 0.5 cm mass right breast lower inner quadrant  T2 N0 stage IIA clinical stage (could be stage IIB if the lymph node in the left axilla is positive)  Recommendationbased on multidisciplinary tumor board: 1. Neoadjuvant chemotherapy with TCH Perjeta 6 cycles followed by Herceptin and Perjetamaintenance for 1 year 2. Followed by breast conserving surgery if possible with sentinel lymph node study 3. Followed by adjuvant radiation therapy if patient had lumpectomy 4. Followed by adjuvant antiestrogen therapy ------------------------------------------------------------------------------ Current Treatment: TCHP cycle 3 Chemo Toxicities:  RTC in 3 weeks for cycle 3

## 2016-11-22 ENCOUNTER — Other Ambulatory Visit (HOSPITAL_BASED_OUTPATIENT_CLINIC_OR_DEPARTMENT_OTHER): Payer: No Typology Code available for payment source

## 2016-11-22 ENCOUNTER — Other Ambulatory Visit: Payer: No Typology Code available for payment source

## 2016-11-22 ENCOUNTER — Telehealth: Payer: Self-pay

## 2016-11-22 ENCOUNTER — Ambulatory Visit (HOSPITAL_BASED_OUTPATIENT_CLINIC_OR_DEPARTMENT_OTHER): Payer: No Typology Code available for payment source

## 2016-11-22 ENCOUNTER — Ambulatory Visit (HOSPITAL_BASED_OUTPATIENT_CLINIC_OR_DEPARTMENT_OTHER): Payer: No Typology Code available for payment source | Admitting: Hematology and Oncology

## 2016-11-22 ENCOUNTER — Encounter: Payer: Self-pay | Admitting: *Deleted

## 2016-11-22 ENCOUNTER — Encounter: Payer: Self-pay | Admitting: Hematology and Oncology

## 2016-11-22 ENCOUNTER — Ambulatory Visit: Payer: No Typology Code available for payment source

## 2016-11-22 DIAGNOSIS — C50212 Malignant neoplasm of upper-inner quadrant of left female breast: Secondary | ICD-10-CM

## 2016-11-22 DIAGNOSIS — D6481 Anemia due to antineoplastic chemotherapy: Secondary | ICD-10-CM | POA: Diagnosis not present

## 2016-11-22 DIAGNOSIS — Z95828 Presence of other vascular implants and grafts: Secondary | ICD-10-CM

## 2016-11-22 DIAGNOSIS — Z170 Estrogen receptor positive status [ER+]: Principal | ICD-10-CM

## 2016-11-22 DIAGNOSIS — Z5189 Encounter for other specified aftercare: Secondary | ICD-10-CM | POA: Diagnosis not present

## 2016-11-22 DIAGNOSIS — Z5112 Encounter for antineoplastic immunotherapy: Secondary | ICD-10-CM | POA: Diagnosis not present

## 2016-11-22 DIAGNOSIS — Z5111 Encounter for antineoplastic chemotherapy: Secondary | ICD-10-CM

## 2016-11-22 LAB — COMPREHENSIVE METABOLIC PANEL
ALT: 27 U/L (ref 0–55)
AST: 18 U/L (ref 5–34)
Albumin: 3.9 g/dL (ref 3.5–5.0)
Alkaline Phosphatase: 57 U/L (ref 40–150)
Anion Gap: 10 mEq/L (ref 3–11)
BUN: 9.2 mg/dL (ref 7.0–26.0)
CALCIUM: 9.1 mg/dL (ref 8.4–10.4)
CHLORIDE: 107 meq/L (ref 98–109)
CO2: 23 meq/L (ref 22–29)
Creatinine: 0.6 mg/dL (ref 0.6–1.1)
EGFR: >90 mL/min/{1.73_m2} (ref >90)
GLUCOSE: 94 mg/dL (ref 70–140)
Potassium: 3.8 mEq/L (ref 3.5–5.1)
Sodium: 141 mEq/L (ref 136–145)
Total Bilirubin: 0.52 mg/dL (ref 0.20–1.20)
Total Protein: 6.1 g/dL — ABNORMAL LOW (ref 6.4–8.3)

## 2016-11-22 LAB — CBC WITH DIFFERENTIAL/PLATELET
BASO%: 0.5 % (ref 0.0–2.0)
Basophils Absolute: 0 10*3/uL (ref 0.0–0.1)
EOS%: 0.1 % (ref 0.0–7.0)
Eosinophils Absolute: 0 10*3/uL (ref 0.0–0.5)
HEMATOCRIT: 33 % — AB (ref 34.8–46.6)
HGB: 11.2 g/dL — ABNORMAL LOW (ref 11.6–15.9)
LYMPH#: 0.9 10*3/uL (ref 0.9–3.3)
LYMPH%: 20.8 % (ref 14.0–49.7)
MCH: 32.1 pg (ref 25.1–34.0)
MCHC: 33.8 g/dL (ref 31.5–36.0)
MCV: 94.9 fL (ref 79.5–101.0)
MONO#: 0.6 10*3/uL (ref 0.1–0.9)
MONO%: 12.5 % (ref 0.0–14.0)
NEUT#: 3 10*3/uL (ref 1.5–6.5)
NEUT%: 66.1 % (ref 38.4–76.8)
Platelets: 189 10*3/uL (ref 145–400)
RBC: 3.48 10*6/uL — ABNORMAL LOW (ref 3.70–5.45)
RDW: 15.4 % — ABNORMAL HIGH (ref 11.2–14.5)
WBC: 4.5 10*3/uL (ref 3.9–10.3)

## 2016-11-22 MED ORDER — HEPARIN SOD (PORK) LOCK FLUSH 100 UNIT/ML IV SOLN
500.0000 [IU] | Freq: Once | INTRAVENOUS | Status: DC | PRN
  Filled 2016-11-22: qty 5

## 2016-11-22 MED ORDER — HEPARIN SOD (PORK) LOCK FLUSH 100 UNIT/ML IV SOLN
500.0000 [IU] | Freq: Once | INTRAVENOUS | Status: AC | PRN
  Administered 2016-11-22: 500 [IU]
  Filled 2016-11-22: qty 5

## 2016-11-22 MED ORDER — ACETAMINOPHEN 325 MG PO TABS
650.0000 mg | ORAL_TABLET | Freq: Once | ORAL | Status: AC
  Administered 2016-11-22: 650 mg via ORAL

## 2016-11-22 MED ORDER — TRASTUZUMAB CHEMO 150 MG IV SOLR
6.0000 mg/kg | Freq: Once | INTRAVENOUS | Status: AC
  Administered 2016-11-22: 357 mg via INTRAVENOUS
  Filled 2016-11-22: qty 17

## 2016-11-22 MED ORDER — SODIUM CHLORIDE 0.9 % IV SOLN
573.0000 mg | Freq: Once | INTRAVENOUS | Status: AC
  Administered 2016-11-22: 570 mg via INTRAVENOUS
  Filled 2016-11-22: qty 57

## 2016-11-22 MED ORDER — SODIUM CHLORIDE 0.9 % IV SOLN
420.0000 mg | Freq: Once | INTRAVENOUS | Status: AC
  Administered 2016-11-22: 420 mg via INTRAVENOUS
  Filled 2016-11-22: qty 14

## 2016-11-22 MED ORDER — DIPHENHYDRAMINE HCL 25 MG PO CAPS
ORAL_CAPSULE | ORAL | Status: AC
  Filled 2016-11-22: qty 2

## 2016-11-22 MED ORDER — SODIUM CHLORIDE 0.9 % IV SOLN
Freq: Once | INTRAVENOUS | Status: AC
  Administered 2016-11-22: 10:00:00 via INTRAVENOUS

## 2016-11-22 MED ORDER — DIPHENHYDRAMINE HCL 25 MG PO CAPS
50.0000 mg | ORAL_CAPSULE | Freq: Once | ORAL | Status: AC
  Administered 2016-11-22: 50 mg via ORAL

## 2016-11-22 MED ORDER — DEXAMETHASONE SODIUM PHOSPHATE 10 MG/ML IJ SOLN
10.0000 mg | Freq: Once | INTRAMUSCULAR | Status: AC
  Administered 2016-11-22: 10 mg via INTRAVENOUS

## 2016-11-22 MED ORDER — SODIUM CHLORIDE 0.9% FLUSH
10.0000 mL | INTRAVENOUS | Status: DC | PRN
  Administered 2016-11-22: 10 mL
  Filled 2016-11-22: qty 10

## 2016-11-22 MED ORDER — PALONOSETRON HCL INJECTION 0.25 MG/5ML
INTRAVENOUS | Status: AC
  Filled 2016-11-22: qty 5

## 2016-11-22 MED ORDER — SODIUM CHLORIDE 0.9% FLUSH
10.0000 mL | INTRAVENOUS | Status: DC | PRN
  Administered 2016-11-22: 10 mL via INTRAVENOUS
  Filled 2016-11-22: qty 10

## 2016-11-22 MED ORDER — ACETAMINOPHEN 325 MG PO TABS
ORAL_TABLET | ORAL | Status: AC
  Filled 2016-11-22: qty 2

## 2016-11-22 MED ORDER — VENLAFAXINE HCL ER 37.5 MG PO CP24: 37.5000 mg | ORAL_CAPSULE | Freq: Every day | ORAL | 3 refills | Status: DC

## 2016-11-22 MED ORDER — DEXAMETHASONE SODIUM PHOSPHATE 10 MG/ML IJ SOLN
INTRAMUSCULAR | Status: AC
  Filled 2016-11-22: qty 1

## 2016-11-22 MED ORDER — DOCETAXEL CHEMO INJECTION 160 MG/16ML
75.0000 mg/m2 | Freq: Once | INTRAVENOUS | Status: AC
  Administered 2016-11-22: 120 mg via INTRAVENOUS
  Filled 2016-11-22: qty 12

## 2016-11-22 MED ORDER — PEGFILGRASTIM 6 MG/0.6ML ~~LOC~~ PSKT
6.0000 mg | PREFILLED_SYRINGE | Freq: Once | SUBCUTANEOUS | Status: AC
  Administered 2016-11-22: 6 mg via SUBCUTANEOUS
  Filled 2016-11-22: qty 0.6

## 2016-11-22 MED ORDER — PALONOSETRON HCL INJECTION 0.25 MG/5ML
0.2500 mg | Freq: Once | INTRAVENOUS | Status: AC
  Administered 2016-11-22: 0.25 mg via INTRAVENOUS

## 2016-11-22 NOTE — Telephone Encounter (Signed)
Called pt lvm with call back number, regarding msg from scheduling that pt was inquiring about.

## 2016-11-22 NOTE — Progress Notes (Signed)
Patient Care Team: No Pcp Per Patient as PCP - General (General Practice) [name], MD as Consulting Physician (General Surgery) Nicholas Lose, MD as Consulting Physician (Hematology and Oncology) Eppie Gibson, MD as Attending Physician (Radiation Oncology) Gardenia Phlegm, NP as Nurse Practitioner (Hematology and Oncology)  DIAGNOSIS:  Encounter Diagnosis  Name Primary?  . Malignant neoplasm of upper-inner quadrant of left breast in female, estrogen receptor positive ([place])     SUMMARY OF ONCOLOGIC HISTORY:   Malignant neoplasm of upper-inner quadrant of left breast in female, estrogen receptor positive ([place])   09/14/2016 Initial Diagnosis    Palpable left breast mass for 2 months, ultrasound at 9:00: 4.7 x 3.6 x 3.3 cm, axilla negative: U/S Bx: Grade 2 IDC ER 95%, PR 50%, HER-2 positive ratio 6.17, Ki-67 20%, T2 N0 stage IIA clinical stage      09/27/2016 Breast MRI    Left breast: 3.5 cm irregular necrotic mass; left axillary lymph node 1.2 cm; right breast 0.6 cm mass at 6:00 position 0.5 cm mass right breast lower inner quadrant       10/11/2016 -  Neo-Adjuvant Chemotherapy    Taxotere, Carboplatin, Herceptin, Perjeta x 6 cycles followed by Herceptin and Perjeta maintenance for one year       CHIEF COMPLIANT: Cycle 3 TCH Perjeta  INTERVAL HISTORY: Miranda Woods is a 61 year old with above-mentioned history of left breast cancer who is here today to receive her third cycle of [place] Perjeta neoadjuvant chemotherapy.She is complaining of emotional instability tearfulness and depression symptoms. Physically she is been in quite well. Denies any nausea vomiting. Denies any bone pain. Denies any fevers or chills.   REVIEW OF SYSTEMS:   Constitutional: Denies fevers, chills or abnormal weight loss, hair loss Eyes: Denies blurriness of vision Ears, nose, mouth, throat, and face: Denies mucositis or sore throat Respiratory: Denies cough, dyspnea or  wheezes Cardiovascular: Denies palpitation, chest discomfort Gastrointestinal:  Denies nausea, heartburn or change in bowel habits Skin: Denies abnormal skin rashes Lymphatics: Denies new lymphadenopathy or easy bruising Neurological:Denies numbness, tingling or new weaknesses Behavioral/Psych: Feeling depressed and tearful  Extremities: No lower extremity edema All other systems were reviewed with the patient and are negative.  I have reviewed the past medical history, past surgical history, social history and family history with the patient and they are unchanged from previous note.  ALLERGIES:  is allergic to codeine.  MEDICATIONS:  Current Outpatient Prescriptions  Medication Sig Dispense Refill  . clindamycin (CLINDAGEL) 1 % gel Apply to rash BID PRN. 30 g 1  . dexamethasone (DECADRON) 4 MG tablet Take 0.5 tablets (2 mg total) by mouth daily. 1/2 tab day before Taxotere. Then again the day after chemo for 1 day 10 tablet 0  . diphenhydrAMINE (BENADRYL) 25 MG tablet Take 1 tablet (25 mg total) by mouth every 6 (six) hours as needed for itching or allergies. 30 tablet 2  . famotidine (PEPCID) 20 MG tablet Take 1 tablet (20 mg total) by mouth 2 (two) times daily as needed for heartburn or indigestion. 30 tablet 2  . hydrOXYzine (ATARAX/VISTARIL) 10 MG tablet Take 5 mg by mouth as needed.    Marland Kitchen letrozole (FEMARA) 2.5 MG tablet Take 1 tablet (2.5 mg total) by mouth daily. 90 tablet 3  . lidocaine-prilocaine (EMLA) cream Apply to affected area once 30 g 3  . LORazepam (ATIVAN) 0.5 MG tablet Take 1-2 tablets (0.5-1 mg total) by mouth every 8 (eight) hours as needed for anxiety or  sleep. 60 tablet 0  . methylphenidate (RITALIN) 5 MG tablet Take 5 mg by mouth 2 (two) times daily as needed.     . ondansetron (ZOFRAN) 8 MG tablet Take 1 tablet (8 mg total) by mouth 2 (two) times daily as needed for refractory nausea / vomiting. Start on day 3 after chemo. (Patient not taking: Reported on  10/25/2016) 30 tablet 1  . prochlorperazine (COMPAZINE) 10 MG tablet Take 1 tablet (10 mg total) by mouth every 6 (six) hours as needed (Nausea or vomiting). (Patient not taking: Reported on 10/25/2016) 30 tablet 1  . UNABLE TO FIND Bone broth    . venlafaxine XR (EFFEXOR-XR) 37.5 MG 24 hr capsule Take 1 capsule (37.5 mg total) by mouth daily with breakfast. 30 capsule 3   No current facility-administered medications for this visit.    Facility-Administered Medications Ordered in Other Visits  Medication Dose Route Frequency Provider Last Rate Last Dose  . CARBOplatin (PARAPLATIN) 570 mg in sodium chloride 0.9 % 250 mL chemo infusion  570 mg Intravenous Once Nicholas Lose, MD      . DOCEtaxel (TAXOTERE) 120 mg in dextrose 5 % 250 mL chemo infusion  75 mg/m2 (Treatment Plan Recorded) Intravenous Once Nicholas Lose, MD      . heparin lock flush 100 unit/mL  500 Units Intracatheter Once PRN Nicholas Lose, MD      . pegfilgrastim (NEULASTA ONPRO KIT) injection 6 mg  6 mg Subcutaneous Once Nicholas Lose, MD      . pertuzumab (PERJETA) 420 mg in sodium chloride 0.9 % 250 mL chemo infusion  420 mg Intravenous Once Nicholas Lose, MD      . sodium chloride flush (NS) 0.9 % injection 10 mL  10 mL Intracatheter PRN Nicholas Lose, MD      . trastuzumab (HERCEPTIN) 357 mg in sodium chloride 0.9 % 250 mL chemo infusion  6 mg/kg (Treatment Plan Recorded) Intravenous Once Nicholas Lose, MD        PHYSICAL EXAMINATION: ECOG PERFORMANCE STATUS: 1 - Symptomatic but completely ambulatory  Vitals:   11/22/16 1011  BP: 128/65  Pulse: 62  Resp: 18  Temp: 97.7 F (36.5 C)   Filed Weights   11/22/16 1011  Weight: 123 lb 4.8 oz (55.9 kg)    GENERAL:alert, no distress and comfortable SKIN: skin color, texture, turgor are normal, no rashes or significant lesions EYES: normal, Conjunctiva are pink and non-injected, sclera clear OROPHARYNX:no exudate, no erythema and lips, buccal mucosa, and tongue normal  NECK:  supple, thyroid normal size, non-tender, without nodularity LYMPH:  no palpable lymphadenopathy in the cervical, axillary or inguinal LUNGS: clear to auscultation and percussion with normal breathing effort HEART: regular rate & rhythm and no murmurs and no lower extremity edema ABDOMEN:abdomen soft, non-tender and normal bowel sounds MUSCULOSKELETAL:no cyanosis of digits and no clubbing  NEURO: alert & oriented x 3 with fluent speech, no focal motor/sensory deficits EXTREMITIES: No lower extremity edema  LABORATORY DATA:  I have reviewed the data as listed   Chemistry      Component Value Date/Time   NA 141 11/22/2016 0903   K 3.8 11/22/2016 0903   CL 101 11/30/2011 1649   CO2 23 11/22/2016 0903   BUN 9.2 11/22/2016 0903   CREATININE 0.6 11/22/2016 0903      Component Value Date/Time   CALCIUM 9.1 11/22/2016 0903   ALKPHOS 57 11/22/2016 0903   AST 18 11/22/2016 0903   ALT 27 11/22/2016 0903   BILITOT 0.52  11/22/2016 3790       Lab Results  Component Value Date   WBC 4.5 11/22/2016   HGB 11.2 (L) 11/22/2016   HCT 33.0 (L) 11/22/2016   MCV 94.9 11/22/2016   PLT 189 11/22/2016   NEUTROABS 3.0 11/22/2016    ASSESSMENT & PLAN:  Malignant neoplasm of upper-inner quadrant of left breast in female, estrogen receptor positive ([place]) Palpable left breast mass for 2 months, ultrasound at 9:00: 4.7 x 3.6 x 3.3 cm, axilla negative: U/S Bx: Grade 2 IDC ER 95%, PR 50%, HER-2 positive ratio 6.17, Ki-67 20%,   MRI breast 09/28/2016: Left breast: 3.5 cm irregular necrotic mass; left axillary lymph node 1.2 cm; right breast 0.6 cm mass at 6:00 position 0.5 cm mass right breast lower inner quadrant  T2 N0 stage IIA clinical stage (could be stage IIB if the lymph node in the left axilla is positive)  Recommendationbased on multidisciplinary tumor board: 1. Neoadjuvant chemotherapy with TCH Perjeta 6 cycles followed by Herceptin and Perjetamaintenance for 1 year 2. Followed by  breast conserving surgery if possible with sentinel lymph node study 3. Followed by adjuvant radiation therapy if patient had lumpectomy 4. Followed by adjuvant antiestrogen therapy ------------------------------------------------------------------------------ Current Treatment: TCHP cycle 3 Chemo Toxicities: 1. Hair loss 2. Depression: I prescribed her Effexor XR 37.5 mg daily 3. Anemia grade 1  RTC in 3 weeks for cycle 3  I spent 25 minutes talking to the patient of which more than half was spent in counseling and coordination of care.  No orders of the defined types were placed in this encounter.  The patient has a good understanding of the overall plan. she agrees with it. she will call with any problems that may develop before the next visit here.   Rulon Eisenmenger, MD 11/22/16

## 2016-11-22 NOTE — Progress Notes (Signed)
Patient complains of burning during flushing of port a cath. Positive blood return noted and port flushes easily without swelling or redness. Dr. Lindi Adie notified.

## 2016-11-22 NOTE — Patient Instructions (Signed)
[facility] Discharge Instructions for Patients Receiving Chemotherapy  Today you received the following chemotherapy agents:  Taxol, Carboplatin, Herceptin, Perjeta  To help prevent nausea and vomiting after your treatment, we encourage you to take your nausea medication as prescribed.   If you develop nausea and vomiting that is not controlled by your nausea medication, call the clinic.   BELOW ARE SYMPTOMS THAT SHOULD BE REPORTED IMMEDIATELY:  *FEVER GREATER THAN 100.5 F  *CHILLS WITH OR WITHOUT FEVER  NAUSEA AND VOMITING THAT IS NOT CONTROLLED WITH YOUR NAUSEA MEDICATION  *UNUSUAL SHORTNESS OF BREATH  *UNUSUAL BRUISING OR BLEEDING  TENDERNESS IN MOUTH AND THROAT WITH OR WITHOUT PRESENCE OF ULCERS  *URINARY PROBLEMS  *BOWEL PROBLEMS  UNUSUAL RASH Items with * indicate a potential emergency and should be followed up as soon as possible.  Feel free to call the clinic you have any questions or concerns. The clinic phone number is [phone].  Please show the [facility] at check-in to the Emergency Department and triage nurse.

## 2016-11-22 NOTE — Patient Instructions (Signed)
Implanted Port Home Guide An implanted port is a type of central line that is placed under the skin. Central lines are used to provide IV access when treatment or nutrition needs to be given through a person's veins. Implanted ports are used for long-term IV access. An implanted port may be placed because:  You need IV medicine that would be irritating to the small veins in your hands or arms.  You need long-term IV medicines, such as antibiotics.  You need IV nutrition for a long period.  You need frequent blood draws for lab tests.  You need dialysis.  Implanted ports are usually placed in the chest area, but they can also be placed in the upper arm, the abdomen, or the leg. An implanted port has two main parts:  Reservoir. The reservoir is round and will appear as a small, raised area under your skin. The reservoir is the part where a needle is inserted to give medicines or draw blood.  Catheter. The catheter is a thin, flexible tube that extends from the reservoir. The catheter is placed into a large vein. Medicine that is inserted into the reservoir goes into the catheter and then into the vein.  How will I care for my incision site? Do not get the incision site wet. Bathe or shower as directed by your health care provider. How is my port accessed? Special steps must be taken to access the port:  Before the port is accessed, a numbing cream can be placed on the skin. This helps numb the skin over the port site.  Your health care provider uses a sterile technique to access the port. ? Your health care provider must put on a mask and sterile gloves. ? The skin over your port is cleaned carefully with an antiseptic and allowed to dry. ? The port is gently pinched between sterile gloves, and a needle is inserted into the port.  Only "non-coring" port needles should be used to access the port. Once the port is accessed, a blood return should be checked. This helps ensure that the port  is in the vein and is not clogged.  If your port needs to remain accessed for a constant infusion, a clear (transparent) bandage will be placed over the needle site. The bandage and needle will need to be changed every week, or as directed by your health care provider.  Keep the bandage covering the needle clean and dry. Do not get it wet. Follow your health care provider's instructions on how to take a shower or bath while the port is accessed.  If your port does not need to stay accessed, no bandage is needed over the port.  What is flushing? Flushing helps keep the port from getting clogged. Follow your health care provider's instructions on how and when to flush the port. Ports are usually flushed with saline solution or a medicine called heparin. The need for flushing will depend on how the port is used.  If the port is used for intermittent medicines or blood draws, the port will need to be flushed: ? After medicines have been given. ? After blood has been drawn. ? As part of routine maintenance.  If a constant infusion is running, the port may not need to be flushed.  How long will my port stay implanted? The port can stay in for as long as your health care provider thinks it is needed. When it is time for the port to come out, surgery will be   done to remove it. The procedure is similar to the one performed when the port was put in. When should I seek immediate medical care? When you have an implanted port, you should seek immediate medical care if:  You notice a bad smell coming from the incision site.  You have swelling, redness, or drainage at the incision site.  You have more swelling or pain at the port site or the surrounding area.  You have a fever that is not controlled with medicine.  This information is not intended to replace advice given to you by your health care provider. Make sure you discuss any questions you have with your health care provider. Document  Released: 07/17/2005 Document Revised: 12/23/2015 Document Reviewed: 03/24/2013 Elsevier Interactive Patient Education  2017 [employer]  

## 2016-11-23 ENCOUNTER — Telehealth: Payer: Self-pay

## 2016-11-23 ENCOUNTER — Other Ambulatory Visit: Payer: Self-pay

## 2016-11-23 NOTE — Telephone Encounter (Signed)
error 

## 2016-11-23 NOTE — Progress Notes (Signed)
Pt called to schedule post 1 week f/u labs/np visit. Confirmed time/date with pt.

## 2016-11-28 ENCOUNTER — Other Ambulatory Visit (HOSPITAL_BASED_OUTPATIENT_CLINIC_OR_DEPARTMENT_OTHER): Payer: BLUE CROSS/BLUE SHIELD

## 2016-11-28 ENCOUNTER — Encounter: Payer: Self-pay | Admitting: Adult Health

## 2016-11-28 ENCOUNTER — Ambulatory Visit (HOSPITAL_BASED_OUTPATIENT_CLINIC_OR_DEPARTMENT_OTHER): Payer: BLUE CROSS/BLUE SHIELD | Admitting: Adult Health

## 2016-11-28 DIAGNOSIS — D6481 Anemia due to antineoplastic chemotherapy: Secondary | ICD-10-CM | POA: Diagnosis not present

## 2016-11-28 DIAGNOSIS — C50212 Malignant neoplasm of upper-inner quadrant of left female breast: Secondary | ICD-10-CM

## 2016-11-28 DIAGNOSIS — Z170 Estrogen receptor positive status [ER+]: Secondary | ICD-10-CM | POA: Diagnosis not present

## 2016-11-28 LAB — CBC WITH DIFFERENTIAL/PLATELET
BASO%: 0.5 % (ref 0.0–2.0)
Basophils Absolute: 0 10*3/uL (ref 0.0–0.1)
EOS%: 0.1 % (ref 0.0–7.0)
Eosinophils Absolute: 0 10*3/uL (ref 0.0–0.5)
HCT: 33.8 % — ABNORMAL LOW (ref 34.8–46.6)
HEMOGLOBIN: 11.4 g/dL — AB (ref 11.6–15.9)
LYMPH%: 20.8 % (ref 14.0–49.7)
MCH: 32 pg (ref 25.1–34.0)
MCHC: 33.8 g/dL (ref 31.5–36.0)
MCV: 94.8 fL (ref 79.5–101.0)
MONO#: 0.8 10*3/uL (ref 0.1–0.9)
MONO%: 13 % (ref 0.0–14.0)
NEUT%: 65.6 % (ref 38.4–76.8)
NEUTROS ABS: 3.9 10*3/uL (ref 1.5–6.5)
Platelets: 110 10*3/uL — ABNORMAL LOW (ref 145–400)
RBC: 3.57 10*6/uL — AB (ref 3.70–5.45)
RDW: 16 % — AB (ref 11.2–14.5)
WBC: 6 10*3/uL (ref 3.9–10.3)
lymph#: 1.3 10*3/uL (ref 0.9–3.3)

## 2016-11-28 LAB — COMPREHENSIVE METABOLIC PANEL
ALBUMIN: 3.9 g/dL (ref 3.5–5.0)
ALK PHOS: 78 U/L (ref 40–150)
ALT: 25 U/L (ref 0–55)
AST: 19 U/L (ref 5–34)
Anion Gap: 7 mEq/L (ref 3–11)
BUN: 12.2 mg/dL (ref 7.0–26.0)
CO2: 25 meq/L (ref 22–29)
CREATININE: 0.6 mg/dL (ref 0.6–1.1)
Calcium: 9.1 mg/dL (ref 8.4–10.4)
Chloride: 104 mEq/L (ref 98–109)
EGFR: >90 mL/min/{1.73_m2} (ref >90)
GLUCOSE: 102 mg/dL (ref 70–140)
Potassium: 4 mEq/L (ref 3.5–5.1)
SODIUM: 137 meq/L (ref 136–145)
TOTAL PROTEIN: 6.3 g/dL — AB (ref 6.4–8.3)
Total Bilirubin: 0.42 mg/dL (ref 0.20–1.20)

## 2016-11-28 NOTE — Progress Notes (Signed)
[facility] Follow up:    No PCP Per Patient No address on file   DIAGNOSIS: Cancer Staging Malignant neoplasm of upper-inner quadrant of left breast in female, estrogen receptor positive ([place]) Staging form: Breast, AJCC 8th Edition - Clinical stage from 09/20/2016: Stage IB (cT2, cN0, cM0, G2, ER: Positive, PR: Positive, HER2: Positive) - Unsigned   SUMMARY OF ONCOLOGIC HISTORY:   Malignant neoplasm of upper-inner quadrant of left breast in female, estrogen receptor positive ([place])   09/14/2016 Initial Diagnosis    Palpable left breast mass for 2 months, ultrasound at 9:00: 4.7 x 3.6 x 3.3 cm, axilla negative: U/S Bx: Grade 2 IDC ER 95%, PR 50%, HER-2 positive ratio 6.17, Ki-67 20%, T2 N0 stage IIA clinical stage      09/27/2016 Breast MRI    Left breast: 3.5 cm irregular necrotic mass; left axillary lymph node 1.2 cm; right breast 0.6 cm mass at 6:00 position 0.5 cm mass right breast lower inner quadrant       10/11/2016 -  Neo-Adjuvant Chemotherapy    Taxotere, Carboplatin, Herceptin, Perjeta x 6 cycles followed by Herceptin and Perjeta maintenance for one year       CURRENT THERAPY: TCHP cycle 3 day 8  INTERVAL HISTORY: Baker Janus 61 y.o. female returns for evaluation after receiving her third cycle of TCHP.  She is doing well today.  She is down to 120 pounds.  She has changed her diet since her breast cancer diagnosis.  Her diet is more plant based, her alcohol is very much limited.  She has been drinking smoothies, however she has a decreased appetite.  She continues to eat as best as she can.  She continues to walk daily.  She is feeling slightly light headed today which improved after drinking ginger ale.  She is not having any diarrhea.  She is not experiencing any peripheral neuropathy.  She is not experiencing chest pain, palpitation, dyspnea, or leg swelling.    Patient Active Problem List   Diagnosis Date Noted  . Rash 10/26/2016  . Port  catheter in place 10/18/2016  . Malignant neoplasm of upper-inner quadrant of left breast in female, estrogen receptor positive ([place]) 09/19/2016  . ADHD (attention deficit hyperactivity disorder)   . Fibrocystic breast disease     is allergic to codeine.  MEDICAL HISTORY: Past Medical History:  Diagnosis Date  . ADHD (attention deficit hyperactivity disorder)   . Fibrocystic breast disease     SURGICAL HISTORY: Past Surgical History:  Procedure Laterality Date  . APPENDECTOMY    . IR GENERIC HISTORICAL  10/10/2016   IR US GUIDE VASC ACCESS RIGHT 10/10/2016 Jacqulynn Cadet, MD [facility]  . IR GENERIC HISTORICAL  10/10/2016   IR FLUORO GUIDE PORT INSERTION RIGHT 10/10/2016 Jacqulynn Cadet, MD [facility]  . ORIF WRIST FRACTURE      SOCIAL HISTORY: Social History   Social History  . Marital status: Single    Spouse name: [name]  . Number of children: 0  . Years of education: N/A   Occupational History  . counselor    Social History Main Topics  . Smoking status: Former Smoker    Types: Cigarettes  . Smokeless tobacco: Never Used  . Alcohol use 0.0 oz/week  . Drug use: No  . Sexual activity: Yes    Partners: Male    Birth control/ protection: Post-menopausal   Other Topics Concern  . Not on file   Social History Narrative  .  No narrative on file    FAMILY HISTORY: Family History  Problem Relation Age of Onset  . Heart disease Father   . Hypertension Father   . Thyroid disease Mother     Review of Systems - Oncology    PHYSICAL EXAMINATION  ECOG PERFORMANCE STATUS: 2 - Symptomatic, <50% confined to bed  Vitals:   11/28/16 1107  BP: 119/82  Pulse: 84  Resp: 20  Temp: 97.6 F (36.4 C)    Physical Exam  Constitutional: She is oriented to person, place, and time and well-developed, well-nourished, and in no distress.  HENT:  Head: Normocephalic and atraumatic.  Mouth/Throat: Oropharynx is clear and moist. No oropharyngeal exudate.   Eyes: Pupils are equal, round, and reactive to light. No scleral icterus.  Neck: Neck supple.  Cardiovascular: Normal rate, regular rhythm and normal heart sounds.   Pulmonary/Chest: Effort normal and breath sounds normal.  Abdominal: Soft. Bowel sounds are normal. She exhibits no distension and no mass. There is no tenderness. There is no rebound and no guarding.  Musculoskeletal: She exhibits no edema.  Lymphadenopathy:    She has no cervical adenopathy.  Neurological: She is alert and oriented to person, place, and time.  Psychiatric: Mood and affect normal.    LABORATORY DATA:  CBC    Component Value Date/Time   WBC 6.0 11/28/2016 1051   WBC 5.6 10/10/2016 1152   RBC 3.57 (L) 11/28/2016 1051   RBC 4.31 10/10/2016 1152   HGB 11.4 (L) 11/28/2016 1051   HCT 33.8 (L) 11/28/2016 1051   PLT 110 (L) 11/28/2016 1051   MCV 94.8 11/28/2016 1051   MCH 32.0 11/28/2016 1051   MCH 30.9 10/10/2016 1152   MCHC 33.8 11/28/2016 1051   MCHC 34.6 10/10/2016 1152   RDW 16.0 (H) 11/28/2016 1051   LYMPHSABS 1.3 11/28/2016 1051   MONOABS 0.8 11/28/2016 1051   EOSABS 0.0 11/28/2016 1051   BASOSABS 0.0 11/28/2016 1051    CMP     Component Value Date/Time   NA 137 11/28/2016 1051   K 4.0 11/28/2016 1051   CL 101 11/30/2011 1649   CO2 25 11/28/2016 1051   GLUCOSE 102 11/28/2016 1051   BUN 12.2 11/28/2016 1051   CREATININE 0.6 11/28/2016 1051   CALCIUM 9.1 11/28/2016 1051   PROT 6.3 (L) 11/28/2016 1051   ALBUMIN 3.9 11/28/2016 1051   AST 19 11/28/2016 1051   ALT 25 11/28/2016 1051   ALKPHOS 78 11/28/2016 1051   BILITOT 0.42 11/28/2016 1051         ASSESSMENT and PLAN:   Malignant neoplasm of upper-inner quadrant of left breast in female, estrogen receptor positive ([facility]) Palpable left breast mass for 2 months, ultrasound at 9:00: 4.7 x 3.6 x 3.3 cm, axilla negative: U/S Bx: Grade 2 IDC ER 95%, PR 50%, HER-2 positive ratio 6.17, Ki-67 20%,   MRI breast 09/28/2016: Left  breast: 3.5 cm irregular necrotic mass; left axillary lymph node 1.2 cm; right breast 0.6 cm mass at 6:00 position 0.5 cm mass right breast lower inner quadrant  T2 N0 stage IIAclinical stage (could be stage IIB if the lymph node in the left axilla is positive)  Recommendationbased on multidisciplinary tumor board: 1. Neoadjuvant chemotherapy with TCH Perjeta 6 cycles followed by Herceptin and Perjetamaintenance for 1 year 2. Followed by breast conserving surgery if possible with sentinel lymph node study 3. Followed by adjuvant radiation therapy if patient had lumpectomy 4. Followed by adjuvant antiestrogen therapy ------------------------------------------------------------------------------ Current Treatment: TCHP  cycle  Chemo Toxicities: 1. Hair loss 2. Depression:  Effexor XR 37.5 mg daily 3. Anemia grade 1  Miranda Woods is doing moderately well today.  I reviewed her labs with her today which were normal.  I encouraged her to increase her protein intake slgihtly and continue with her healthy diet and exercise.  I sent a request to Kevan Rosebush RN to get her in with cardio oncology clinic as her next echo is due in June.  I reviewed the above with [place] in detail.  She will return in two weeks for labs and follow up with Dr. Lindi Adie along with cycle 4 of neo adjuvant TCHP.     A total of (30) minutes of face-to-face time was spent with this patient with greater than 50% of that time in counseling and care-coordination.   All questions were answered. The patient knows to call the clinic with any problems, questions or concerns. We can certainly see the patient much sooner if necessary.  Scot Dock, NP 11/28/2016

## 2016-11-28 NOTE — Assessment & Plan Note (Addendum)
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 Neoadjuvant chemotherapy with TCH Perjeta 6 cycles followed by Herceptin and Perjetamaintenance for 1 year 2. Followed by breast conserving surgery if possible with sentinel lymph node study 3. Followed by adjuvant radiation therapy if patient had lumpectomy 4. Followed by adjuvant antiestrogen therapy ------------------------------------------------------------------------------ Current Treatment: TCHP cycle  Chemo Toxicities: 1. Hair loss 2. Depression:  Effexor XR 37.5 mg daily 3. Anemia grade 1  Miranda Woods is doing moderately well today.  I reviewed her labs with her today which were normal.  I encouraged her to increase her protein intake slgihtly and continue with her healthy diet and exercise.  I sent a request to Kevan Rosebush RN to get her in with cardio oncology clinic as her next echo is due in June.  I reviewed the above with Miranda Woods in detail.  She will return in two weeks for labs and follow up with Dr. Lindi Adie along with cycle 4 of neo adjuvant TCHP.

## 2016-12-12 NOTE — Assessment & Plan Note (Signed)
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 Neoadjuvant chemotherapy with TCH Perjeta 6 cycles followed by Herceptin and Perjetamaintenance for 1 year 2. Followed by breast conserving surgery if possible with sentinel lymph node study 3. Followed by adjuvant radiation therapy if patient had lumpectomy 4. Followed by adjuvant antiestrogen therapy ------------------------------------------------------------------------------ Current Treatment: TCHP cycle 4 Chemo Toxicities: 1. Hair loss 2. Depression:  Effexor XR 37.5 mg daily 3. Anemia grade 1 Patient's insurance did not authorize neulasta

## 2016-12-13 ENCOUNTER — Other Ambulatory Visit: Payer: No Typology Code available for payment source

## 2016-12-13 ENCOUNTER — Other Ambulatory Visit (HOSPITAL_BASED_OUTPATIENT_CLINIC_OR_DEPARTMENT_OTHER): Payer: BLUE CROSS/BLUE SHIELD

## 2016-12-13 ENCOUNTER — Ambulatory Visit: Payer: BLUE CROSS/BLUE SHIELD

## 2016-12-13 ENCOUNTER — Ambulatory Visit (HOSPITAL_BASED_OUTPATIENT_CLINIC_OR_DEPARTMENT_OTHER): Payer: BLUE CROSS/BLUE SHIELD | Admitting: Hematology and Oncology

## 2016-12-13 ENCOUNTER — Ambulatory Visit: Payer: No Typology Code available for payment source

## 2016-12-13 ENCOUNTER — Ambulatory Visit (HOSPITAL_BASED_OUTPATIENT_CLINIC_OR_DEPARTMENT_OTHER): Payer: BLUE CROSS/BLUE SHIELD

## 2016-12-13 DIAGNOSIS — Z170 Estrogen receptor positive status [ER+]: Secondary | ICD-10-CM

## 2016-12-13 DIAGNOSIS — C50212 Malignant neoplasm of upper-inner quadrant of left female breast: Secondary | ICD-10-CM

## 2016-12-13 DIAGNOSIS — Z5112 Encounter for antineoplastic immunotherapy: Secondary | ICD-10-CM

## 2016-12-13 DIAGNOSIS — Z5111 Encounter for antineoplastic chemotherapy: Secondary | ICD-10-CM

## 2016-12-13 DIAGNOSIS — D6481 Anemia due to antineoplastic chemotherapy: Secondary | ICD-10-CM | POA: Diagnosis not present

## 2016-12-13 DIAGNOSIS — Z95828 Presence of other vascular implants and grafts: Secondary | ICD-10-CM

## 2016-12-13 LAB — COMPREHENSIVE METABOLIC PANEL
ALT: 34 U/L (ref 0–55)
AST: 20 U/L (ref 5–34)
Albumin: 4 g/dL (ref 3.5–5.0)
Alkaline Phosphatase: 58 U/L (ref 40–150)
Anion Gap: 8 mEq/L (ref 3–11)
BUN: 11.2 mg/dL (ref 7.0–26.0)
CO2: 24 meq/L (ref 22–29)
Calcium: 9.3 mg/dL (ref 8.4–10.4)
Chloride: 107 mEq/L (ref 98–109)
Creatinine: 0.5 mg/dL — ABNORMAL LOW (ref 0.6–1.1)
GLUCOSE: 102 mg/dL (ref 70–140)
Potassium: 3.9 mEq/L (ref 3.5–5.1)
SODIUM: 140 meq/L (ref 136–145)
Total Bilirubin: 0.48 mg/dL (ref 0.20–1.20)
Total Protein: 6.5 g/dL (ref 6.4–8.3)

## 2016-12-13 LAB — CBC WITH DIFFERENTIAL/PLATELET
BASO%: 0.4 % (ref 0.0–2.0)
BASOS ABS: 0 10*3/uL (ref 0.0–0.1)
EOS ABS: 0 10*3/uL (ref 0.0–0.5)
EOS%: 0 % (ref 0.0–7.0)
HCT: 34 % — ABNORMAL LOW (ref 34.8–46.6)
HGB: 11.6 g/dL (ref 11.6–15.9)
LYMPH%: 12 % — ABNORMAL LOW (ref 14.0–49.7)
MCH: 33.4 pg (ref 25.1–34.0)
MCHC: 34.1 g/dL (ref 31.5–36.0)
MCV: 97.7 fL (ref 79.5–101.0)
MONO#: 0.5 10*3/uL (ref 0.1–0.9)
MONO%: 11.2 % (ref 0.0–14.0)
NEUT#: 3.6 10*3/uL (ref 1.5–6.5)
NEUT%: 76.4 % (ref 38.4–76.8)
Platelets: 213 10*3/uL (ref 145–400)
RBC: 3.48 10*6/uL — AB (ref 3.70–5.45)
RDW: 17.7 % — ABNORMAL HIGH (ref 11.2–14.5)
WBC: 4.7 10*3/uL (ref 3.9–10.3)
lymph#: 0.6 10*3/uL — ABNORMAL LOW (ref 0.9–3.3)

## 2016-12-13 MED ORDER — SODIUM CHLORIDE 0.9 % IV SOLN
420.0000 mg | Freq: Once | INTRAVENOUS | Status: AC
  Administered 2016-12-13: 420 mg via INTRAVENOUS
  Filled 2016-12-13: qty 14

## 2016-12-13 MED ORDER — PALONOSETRON HCL INJECTION 0.25 MG/5ML
INTRAVENOUS | Status: AC
Start: 2016-12-13 — End: 2016-12-13
  Filled 2016-12-13: qty 5

## 2016-12-13 MED ORDER — ACETAMINOPHEN 325 MG PO TABS
650.0000 mg | ORAL_TABLET | Freq: Once | ORAL | Status: AC
  Administered 2016-12-13: 650 mg via ORAL

## 2016-12-13 MED ORDER — DIPHENHYDRAMINE HCL 25 MG PO CAPS
50.0000 mg | ORAL_CAPSULE | Freq: Once | ORAL | Status: AC
  Administered 2016-12-13: 50 mg via ORAL

## 2016-12-13 MED ORDER — PALONOSETRON HCL INJECTION 0.25 MG/5ML
0.2500 mg | Freq: Once | INTRAVENOUS | Status: AC
  Administered 2016-12-13: 0.25 mg via INTRAVENOUS

## 2016-12-13 MED ORDER — ONDANSETRON HCL 8 MG PO TABS: 8.0000 mg | ORAL_TABLET | Freq: Two times a day (BID) | ORAL | 1 refills | Status: DC | PRN

## 2016-12-13 MED ORDER — PEGFILGRASTIM 6 MG/0.6ML ~~LOC~~ PSKT: 6.0000 mg | PREFILLED_SYRINGE | Freq: Once | SUBCUTANEOUS | Status: DC

## 2016-12-13 MED ORDER — SODIUM CHLORIDE 0.9% FLUSH
10.0000 mL | INTRAVENOUS | Status: DC | PRN
  Administered 2016-12-13: 10 mL via INTRAVENOUS
  Filled 2016-12-13: qty 10

## 2016-12-13 MED ORDER — LORAZEPAM 0.5 MG PO TABS: 0.5000 mg | ORAL_TABLET | Freq: Three times a day (TID) | ORAL | 0 refills | Status: DC | PRN

## 2016-12-13 MED ORDER — SODIUM CHLORIDE 0.9 % IV SOLN
573.0000 mg | Freq: Once | INTRAVENOUS | Status: AC
  Administered 2016-12-13: 570 mg via INTRAVENOUS
  Filled 2016-12-13: qty 57

## 2016-12-13 MED ORDER — ACETAMINOPHEN 325 MG PO TABS
ORAL_TABLET | ORAL | Status: AC
  Filled 2016-12-13: qty 2

## 2016-12-13 MED ORDER — DIPHENHYDRAMINE HCL 25 MG PO CAPS
ORAL_CAPSULE | ORAL | Status: AC
  Filled 2016-12-13: qty 2

## 2016-12-13 MED ORDER — HEPARIN SOD (PORK) LOCK FLUSH 100 UNIT/ML IV SOLN
500.0000 [IU] | Freq: Once | INTRAVENOUS | Status: AC | PRN
  Administered 2016-12-13: 500 [IU]
  Filled 2016-12-13: qty 5

## 2016-12-13 MED ORDER — SODIUM CHLORIDE 0.9% FLUSH
10.0000 mL | INTRAVENOUS | Status: DC | PRN
  Administered 2016-12-13: 10 mL
  Filled 2016-12-13: qty 10

## 2016-12-13 MED ORDER — DEXAMETHASONE SODIUM PHOSPHATE 10 MG/ML IJ SOLN
INTRAMUSCULAR | Status: AC
  Filled 2016-12-13: qty 1

## 2016-12-13 MED ORDER — TRASTUZUMAB CHEMO 150 MG IV SOLR
6.0000 mg/kg | Freq: Once | INTRAVENOUS | Status: AC
  Administered 2016-12-13: 357 mg via INTRAVENOUS
  Filled 2016-12-13: qty 17

## 2016-12-13 MED ORDER — SODIUM CHLORIDE 0.9 % IV SOLN
Freq: Once | INTRAVENOUS | Status: AC
  Administered 2016-12-13: 12:00:00 via INTRAVENOUS

## 2016-12-13 MED ORDER — DEXAMETHASONE SODIUM PHOSPHATE 10 MG/ML IJ SOLN
10.0000 mg | Freq: Once | INTRAMUSCULAR | Status: AC
  Administered 2016-12-13: 10 mg via INTRAVENOUS

## 2016-12-13 MED ORDER — DOCETAXEL CHEMO INJECTION 160 MG/16ML
75.0000 mg/m2 | Freq: Once | INTRAVENOUS | Status: AC
  Administered 2016-12-13: 120 mg via INTRAVENOUS
  Filled 2016-12-13: qty 12

## 2016-12-13 MED ORDER — SODIUM CHLORIDE 0.9 % IV SOLN: Freq: Once | INTRAVENOUS | Status: DC

## 2016-12-13 NOTE — Progress Notes (Signed)
Patient Care Team: Patient, No Pcp Per as PCP - General (General Practice) [name], MD as Consulting Physician (General Surgery) Nicholas Lose, MD as Consulting Physician (Hematology and Oncology) Eppie Gibson, MD as Attending Physician (Radiation Oncology) Gardenia Phlegm, NP as Nurse Practitioner (Hematology and Oncology)  DIAGNOSIS:  Encounter Diagnosis  Name Primary?  . Malignant neoplasm of upper-inner quadrant of left breast in female, estrogen receptor positive ([place])     SUMMARY OF ONCOLOGIC HISTORY:   Malignant neoplasm of upper-inner quadrant of left breast in female, estrogen receptor positive ([place])   09/14/2016 Initial Diagnosis    Palpable left breast mass for 2 months, ultrasound at 9:00: 4.7 x 3.6 x 3.3 cm, axilla negative: U/S Bx: Grade 2 IDC ER 95%, PR 50%, HER-2 positive ratio 6.17, Ki-67 20%, T2 N0 stage IIA clinical stage      09/27/2016 Breast MRI    Left breast: 3.5 cm irregular necrotic mass; left axillary lymph node 1.2 cm; right breast 0.6 cm mass at 6:00 position 0.5 cm mass right breast lower inner quadrant       10/11/2016 -  Neo-Adjuvant Chemotherapy    Taxotere, Carboplatin, Herceptin, Perjeta x 6 cycles followed by Herceptin and Perjeta maintenance for one year       CHIEF COMPLIANT: Cycle 4 TCH Perjeta  INTERVAL HISTORY: CHRISSA Woods is a 61 year old with above-mentioned history of left breast cancer currently on neoadjuvant chemotherapy with TCH Perjeta. She just changed her insurance to [employer] and Neulasta is not authorized by them. Because of this we have discontinued Neulasta. She has felt fatigued but denies any nausea vomiting.  REVIEW OF SYSTEMS:   Constitutional: Denies fevers, chills or abnormal weight loss Eyes: Denies blurriness of vision Ears, nose, mouth, throat, and face: Denies mucositis or sore throat Respiratory: Denies cough, dyspnea or wheezes Cardiovascular: Denies palpitation, chest  discomfort Gastrointestinal:  Denies nausea, heartburn or change in bowel habits Skin: Denies abnormal skin rashes Lymphatics: Denies new lymphadenopathy or easy bruising Neurological:Denies numbness, tingling or new weaknesses Behavioral/Psych: Mood is stable, no new changes  Extremities: No lower extremity edema All other systems were reviewed with the patient and are negative.  I have reviewed the past medical history, past surgical history, social history and family history with the patient and they are unchanged from previous note.  ALLERGIES:  is allergic to codeine.  MEDICATIONS:  Current Outpatient Prescriptions  Medication Sig Dispense Refill  . clindamycin (CLINDAGEL) 1 % gel Apply to rash BID PRN. (Patient not taking: Reported on 11/28/2016) 30 g 1  . dexamethasone (DECADRON) 4 MG tablet Take 0.5 tablets (2 mg total) by mouth daily. 1/2 tab day before Taxotere. Then again the day after chemo for 1 day 10 tablet 0  . diphenhydrAMINE (BENADRYL) 25 MG tablet Take 1 tablet (25 mg total) by mouth every 6 (six) hours as needed for itching or allergies. (Patient not taking: Reported on 11/28/2016) 30 tablet 2  . famotidine (PEPCID) 20 MG tablet Take 1 tablet (20 mg total) by mouth 2 (two) times daily as needed for heartburn or indigestion. 30 tablet 2  . hydrOXYzine (ATARAX/VISTARIL) 10 MG tablet Take 5 mg by mouth as needed.    Marland Kitchen letrozole (FEMARA) 2.5 MG tablet Take 1 tablet (2.5 mg total) by mouth daily. 90 tablet 3  . lidocaine-prilocaine (EMLA) cream Apply to affected area once 30 g 3  . LORazepam (ATIVAN) 0.5 MG tablet Take 1-2 tablets (0.5-1 mg total) by mouth  every 8 (eight) hours as needed for anxiety or sleep. 60 tablet 0  . methylphenidate (RITALIN) 5 MG tablet Take 5 mg by mouth 2 (two) times daily as needed.     . ondansetron (ZOFRAN) 8 MG tablet Take 1 tablet (8 mg total) by mouth 2 (two) times daily as needed for refractory nausea / vomiting. Start on day 3 after chemo. 30  tablet 1  . prochlorperazine (COMPAZINE) 10 MG tablet Take 1 tablet (10 mg total) by mouth every 6 (six) hours as needed (Nausea or vomiting). (Patient not taking: Reported on 11/28/2016) 30 tablet 1  . UNABLE TO FIND Bone broth    . venlafaxine XR (EFFEXOR-XR) 37.5 MG 24 hr capsule Take 1 capsule (37.5 mg total) by mouth daily with breakfast. (Patient not taking: Reported on 11/28/2016) 30 capsule 3   No current facility-administered medications for this visit.    Facility-Administered Medications Ordered in Other Visits  Medication Dose Route Frequency Provider Last Rate Last Dose  . 0.9 %  sodium chloride infusion   Intravenous Once Nicholas Lose, MD      . 0.9 %  sodium chloride infusion   Intravenous Once Nicholas Lose, MD      . acetaminophen (TYLENOL) tablet 650 mg  650 mg Oral Once Nicholas Lose, MD      . CARBOplatin (PARAPLATIN) 570 mg in sodium chloride 0.9 % 250 mL chemo infusion  570 mg Intravenous Once Nicholas Lose, MD      . dexamethasone (DECADRON) injection 10 mg  10 mg Intravenous Once Nicholas Lose, MD      . diphenhydrAMINE (BENADRYL) capsule 50 mg  50 mg Oral Once Nicholas Lose, MD      . DOCEtaxel (TAXOTERE) 120 mg in dextrose 5 % 250 mL chemo infusion  75 mg/m2 (Treatment Plan Recorded) Intravenous Once Nicholas Lose, MD      . heparin lock flush 100 unit/mL  500 Units Intracatheter Once PRN Nicholas Lose, MD      . palonosetron (ALOXI) injection 0.25 mg  0.25 mg Intravenous Once Nicholas Lose, MD      . pertuzumab (PERJETA) 420 mg in sodium chloride 0.9 % 250 mL chemo infusion  420 mg Intravenous Once Nicholas Lose, MD      . sodium chloride flush (NS) 0.9 % injection 10 mL  10 mL Intracatheter PRN Nicholas Lose, MD      . trastuzumab (HERCEPTIN) 357 mg in sodium chloride 0.9 % 250 mL chemo infusion  6 mg/kg (Treatment Plan Recorded) Intravenous Once Nicholas Lose, MD        PHYSICAL EXAMINATION: ECOG PERFORMANCE STATUS: 1 - Symptomatic but completely  ambulatory  Vitals:   12/13/16 1033  BP: 131/68  Pulse: 68  Resp: 18  Temp: 97.8 F (36.6 C)   Filed Weights   12/13/16 1033  Weight: 122 lb 8 oz (55.6 kg)    GENERAL:alert, no distress and comfortable SKIN: skin color, texture, turgor are normal, no rashes or significant lesions EYES: normal, Conjunctiva are pink and non-injected, sclera clear OROPHARYNX:no exudate, no erythema and lips, buccal mucosa, and tongue normal  NECK: supple, thyroid normal size, non-tender, without nodularity LYMPH:  no palpable lymphadenopathy in the cervical, axillary or inguinal LUNGS: clear to auscultation and percussion with normal breathing effort HEART: regular rate & rhythm and no murmurs and no lower extremity edema ABDOMEN:abdomen soft, non-tender and normal bowel sounds MUSCULOSKELETAL:no cyanosis of digits and no clubbing  NEURO: alert & oriented x 3 with fluent speech, no  focal motor/sensory deficits EXTREMITIES: No lower extremity edema  LABORATORY DATA:  I have reviewed the data as listed   Chemistry      Component Value Date/Time   NA 140 12/13/2016 1001   K 3.9 12/13/2016 1001   CL 101 11/30/2011 1649   CO2 24 12/13/2016 1001   BUN 11.2 12/13/2016 1001   CREATININE 0.5 (L) 12/13/2016 1001      Component Value Date/Time   CALCIUM 9.3 12/13/2016 1001   ALKPHOS 58 12/13/2016 1001   AST 20 12/13/2016 1001   ALT 34 12/13/2016 1001   BILITOT 0.48 12/13/2016 1001       Lab Results  Component Value Date   WBC 4.7 12/13/2016   HGB 11.6 12/13/2016   HCT 34.0 (L) 12/13/2016   MCV 97.7 12/13/2016   PLT 213 12/13/2016   NEUTROABS 3.6 12/13/2016    ASSESSMENT & PLAN:  Malignant neoplasm of upper-inner quadrant of left breast in female, estrogen receptor positive ([facility]) Palpable left breast mass for 2 months, ultrasound at 9:00: 4.7 x 3.6 x 3.3 cm, axilla negative: U/S Bx: Grade 2 IDC ER 95%, PR 50%, HER-2 positive ratio 6.17, Ki-67 20%,   MRI breast 09/28/2016: Left  breast: 3.5 cm irregular necrotic mass; left axillary lymph node 1.2 cm; right breast 0.6 cm mass at 6:00 position 0.5 cm mass right breast lower inner quadrant  T2 N0 stage IIAclinical stage (could be stage IIB if the lymph node in the left axilla is positive)  Recommendationbased on multidisciplinary tumor board: 1. Neoadjuvant chemotherapy with TCH Perjeta 6 cycles followed by Herceptin and Perjetamaintenance for 1 year 2. Followed by breast conserving surgery if possible with sentinel lymph node study 3. Followed by adjuvant radiation therapy if patient had lumpectomy 4. Followed by adjuvant antiestrogen therapy ------------------------------------------------------------------------------ Current Treatment: TCHP cycle 4 Chemo Toxicities: 1. Hair loss 2. Depression:   we prescribed Effexor XR 37.5 mg daily but patient decided to not take it. 3. Anemia grade 1 Patient's insurance did not authorize neulasta. So I discontinued it from cycle 4. Return to clinic in 3 weeks for cycle 5 and we will assess if her blood counts are adequate given the fact that we were not able to give Neulasta.  I spent 25 minutes talking to the patient of which more than half was spent in counseling and coordination of care.  No orders of the defined types were placed in this encounter.  The patient has a good understanding of the overall plan. she agrees with it. she will call with any problems that may develop before the next visit here.   Rulon Eisenmenger, MD 12/13/16

## 2016-12-13 NOTE — Patient Instructions (Signed)
[facility] Discharge Instructions for Patients Receiving Chemotherapy  Today you received the following chemotherapy agents:  Taxol, Carboplatin, Herceptin, Perjeta  To help prevent nausea and vomiting after your treatment, we encourage you to take your nausea medication as prescribed. NO ZOFRAN FOR 3 DAYS   If you develop nausea and vomiting that is not controlled by your nausea medication, call the clinic.   BELOW ARE SYMPTOMS THAT SHOULD BE REPORTED IMMEDIATELY:  *FEVER GREATER THAN 100.5 F  *CHILLS WITH OR WITHOUT FEVER  NAUSEA AND VOMITING THAT IS NOT CONTROLLED WITH YOUR NAUSEA MEDICATION  *UNUSUAL SHORTNESS OF BREATH  *UNUSUAL BRUISING OR BLEEDING  TENDERNESS IN MOUTH AND THROAT WITH OR WITHOUT PRESENCE OF ULCERS  *URINARY PROBLEMS  *BOWEL PROBLEMS  UNUSUAL RASH Items with * indicate a potential emergency and should be followed up as soon as possible.  Feel free to call the clinic you have any questions or concerns. The clinic phone number is [phone].  Please show the [facility] at check-in to the Emergency Department and triage nurse.

## 2016-12-13 NOTE — Patient Instructions (Signed)

## 2016-12-14 ENCOUNTER — Other Ambulatory Visit: Payer: Self-pay | Admitting: Hematology and Oncology

## 2016-12-14 ENCOUNTER — Telehealth: Payer: Self-pay | Admitting: *Deleted

## 2016-12-14 ENCOUNTER — Ambulatory Visit (HOSPITAL_BASED_OUTPATIENT_CLINIC_OR_DEPARTMENT_OTHER): Payer: BLUE CROSS/BLUE SHIELD

## 2016-12-14 VITALS — BP 129/68 | HR 70 | Temp 97.5°F | Resp 18

## 2016-12-14 DIAGNOSIS — D6481 Anemia due to antineoplastic chemotherapy: Secondary | ICD-10-CM | POA: Diagnosis not present

## 2016-12-14 DIAGNOSIS — C50212 Malignant neoplasm of upper-inner quadrant of left female breast: Secondary | ICD-10-CM

## 2016-12-14 DIAGNOSIS — Z170 Estrogen receptor positive status [ER+]: Secondary | ICD-10-CM

## 2016-12-14 DIAGNOSIS — Z95828 Presence of other vascular implants and grafts: Secondary | ICD-10-CM

## 2016-12-14 MED ORDER — PEGFILGRASTIM INJECTION 6 MG/0.6ML ~~LOC~~
6.0000 mg | PREFILLED_SYRINGE | Freq: Once | SUBCUTANEOUS | Status: AC
  Administered 2016-12-14: 6 mg via SUBCUTANEOUS
  Filled 2016-12-14: qty 0.6

## 2016-12-14 NOTE — Telephone Encounter (Signed)
Spoke with patient to let her know that her neulasta had been approved.  Appt made for today 12/14/16 at 415pm.  Patient aware.

## 2016-12-14 NOTE — Patient Instructions (Signed)
Pegfilgrastim injection What is this medicine? PEGFILGRASTIM (PEG fil gra stim) is a long-acting granulocyte colony-stimulating factor that stimulates the growth of neutrophils, a type of white blood cell important in the body's fight against infection. It is used to reduce the incidence of fever and infection in patients with certain types of cancer who are receiving chemotherapy that affects the bone marrow, and to increase survival after being exposed to high doses of radiation. This medicine may be used for other purposes; ask your health care provider or pharmacist if you have questions. COMMON BRAND NAME(S): Neulasta What should I tell my health care provider before I take this medicine? They need to know if you have any of these conditions: -kidney disease -latex allergy -ongoing radiation therapy -sickle cell disease -skin reactions to acrylic adhesives (On-Body Injector only) -an unusual or allergic reaction to pegfilgrastim, filgrastim, other medicines, foods, dyes, or preservatives -pregnant or trying to get pregnant -breast-feeding How should I use this medicine? This medicine is for injection under the skin. If you get this medicine at home, you will be taught how to prepare and give the pre-filled syringe or how to use the On-body Injector. Refer to the patient Instructions for Use for detailed instructions. Use exactly as directed. Tell your healthcare provider immediately if you suspect that the On-body Injector may not have performed as intended or if you suspect the use of the On-body Injector resulted in a missed or partial dose. It is important that you put your used needles and syringes in a special sharps container. Do not put them in a trash can. If you do not have a sharps container, call your pharmacist or healthcare provider to get one. Talk to your pediatrician regarding the use of this medicine in children. While this drug may be prescribed for selected conditions,  precautions do apply. Overdosage: If you think you have taken too much of this medicine contact a poison control center or emergency room at once. NOTE: This medicine is only for you. Do not share this medicine with others. What if I miss a dose? It is important not to miss your dose. Call your doctor or health care professional if you miss your dose. If you miss a dose due to an On-body Injector failure or leakage, a new dose should be administered as soon as possible using a single prefilled syringe for manual use. What may interact with this medicine? Interactions have not been studied. Give your health care provider a list of all the medicines, herbs, non-prescription drugs, or dietary supplements you use. Also tell them if you smoke, drink alcohol, or use illegal drugs. Some items may interact with your medicine. This list may not describe all possible interactions. Give your health care provider a list of all the medicines, herbs, non-prescription drugs, or dietary supplements you use. Also tell them if you smoke, drink alcohol, or use illegal drugs. Some items may interact with your medicine. What should I watch for while using this medicine? You may need blood work done while you are taking this medicine. If you are going to need a MRI, CT scan, or other procedure, tell your doctor that you are using this medicine (On-Body Injector only). What side effects may I notice from receiving this medicine? Side effects that you should report to your doctor or health care professional as soon as possible: -allergic reactions like skin rash, itching or hives, swelling of the face, lips, or tongue -dizziness -fever -pain, redness, or irritation at site   where injected -pinpoint red spots on the skin -red or dark-brown urine -shortness of breath or breathing problems -stomach or side pain, or pain at the shoulder -swelling -tiredness -trouble passing urine or change in the amount of urine Side  effects that usually do not require medical attention (report to your doctor or health care professional if they continue or are bothersome): -bone pain -muscle pain This list may not describe all possible side effects. Call your doctor for medical advice about side effects. You may report side effects to FDA at 1-800-FDA-1088. Where should I keep my medicine? Keep out of the reach of children. Store pre-filled syringes in a refrigerator between 2 and 8 degrees C (36 and 46 degrees F). Do not freeze. Keep in carton to protect from light. Throw away this medicine if it is left out of the refrigerator for more than 48 hours. Throw away any unused medicine after the expiration date. NOTE: This sheet is a summary. It may not cover all possible information. If you have questions about this medicine, talk to your doctor, pharmacist, or health care provider.  2018 Elsevier/Gold Standard (2016-07-13 12:58:03)  

## 2016-12-29 DIAGNOSIS — C801 Malignant (primary) neoplasm, unspecified: Secondary | ICD-10-CM

## 2016-12-29 HISTORY — DX: Malignant (primary) neoplasm, unspecified: C80.1

## 2017-01-02 NOTE — Assessment & Plan Note (Signed)
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 Neoadjuvant chemotherapy with TCH Perjeta 6 cycles followed by Herceptin and Perjetamaintenance for 1 year 2. Followed by breast conserving surgery if possible with sentinel lymph node study 3. Followed by adjuvant radiation therapy if patient had lumpectomy 4. Followed by adjuvant antiestrogen therapy ------------------------------------------------------------------------------ Current Treatment: TCHP cycle 5 (neulasta D/C ed) Chemo Toxicities: 1. Hair loss 2. Depression:  we prescribed Effexor XR 37.5 mg daily but patient decided to not take it. 3. Anemia grade 1

## 2017-01-03 ENCOUNTER — Encounter: Payer: Self-pay | Admitting: General Practice

## 2017-01-03 ENCOUNTER — Encounter: Payer: Self-pay | Admitting: Hematology and Oncology

## 2017-01-03 ENCOUNTER — Ambulatory Visit (HOSPITAL_BASED_OUTPATIENT_CLINIC_OR_DEPARTMENT_OTHER): Payer: BLUE CROSS/BLUE SHIELD | Admitting: Hematology and Oncology

## 2017-01-03 ENCOUNTER — Ambulatory Visit (HOSPITAL_BASED_OUTPATIENT_CLINIC_OR_DEPARTMENT_OTHER): Payer: BLUE CROSS/BLUE SHIELD

## 2017-01-03 ENCOUNTER — Encounter: Payer: Self-pay | Admitting: *Deleted

## 2017-01-03 ENCOUNTER — Other Ambulatory Visit (HOSPITAL_BASED_OUTPATIENT_CLINIC_OR_DEPARTMENT_OTHER): Payer: BLUE CROSS/BLUE SHIELD

## 2017-01-03 ENCOUNTER — Ambulatory Visit: Payer: BLUE CROSS/BLUE SHIELD

## 2017-01-03 DIAGNOSIS — Z170 Estrogen receptor positive status [ER+]: Secondary | ICD-10-CM | POA: Diagnosis not present

## 2017-01-03 DIAGNOSIS — Z5112 Encounter for antineoplastic immunotherapy: Secondary | ICD-10-CM

## 2017-01-03 DIAGNOSIS — Z95828 Presence of other vascular implants and grafts: Secondary | ICD-10-CM

## 2017-01-03 DIAGNOSIS — C50212 Malignant neoplasm of upper-inner quadrant of left female breast: Secondary | ICD-10-CM

## 2017-01-03 DIAGNOSIS — Z5111 Encounter for antineoplastic chemotherapy: Secondary | ICD-10-CM | POA: Diagnosis not present

## 2017-01-03 DIAGNOSIS — D6481 Anemia due to antineoplastic chemotherapy: Secondary | ICD-10-CM | POA: Diagnosis not present

## 2017-01-03 DIAGNOSIS — Z5189 Encounter for other specified aftercare: Secondary | ICD-10-CM

## 2017-01-03 LAB — COMPREHENSIVE METABOLIC PANEL
ALBUMIN: 3.8 g/dL (ref 3.5–5.0)
ALK PHOS: 57 U/L (ref 40–150)
ALT: 31 U/L (ref 0–55)
AST: 20 U/L (ref 5–34)
Anion Gap: 7 mEq/L (ref 3–11)
BILIRUBIN TOTAL: 0.4 mg/dL (ref 0.20–1.20)
BUN: 12.6 mg/dL (ref 7.0–26.0)
CO2: 27 meq/L (ref 22–29)
Calcium: 9.2 mg/dL (ref 8.4–10.4)
Chloride: 108 mEq/L (ref 98–109)
Creatinine: 0.7 mg/dL (ref 0.6–1.1)
EGFR: >90 mL/min/{1.73_m2} (ref >90)
GLUCOSE: 109 mg/dL (ref 70–140)
Potassium: 4.6 mEq/L (ref 3.5–5.1)
SODIUM: 142 meq/L (ref 136–145)
TOTAL PROTEIN: 6.2 g/dL — AB (ref 6.4–8.3)

## 2017-01-03 LAB — CBC WITH DIFFERENTIAL/PLATELET
BASO%: 0.3 % (ref 0.0–2.0)
Basophils Absolute: 0 10*3/uL (ref 0.0–0.1)
EOS%: 0 % (ref 0.0–7.0)
Eosinophils Absolute: 0 10*3/uL (ref 0.0–0.5)
HCT: 33.5 % — ABNORMAL LOW (ref 34.8–46.6)
HEMOGLOBIN: 11.3 g/dL — AB (ref 11.6–15.9)
LYMPH%: 11.3 % — ABNORMAL LOW (ref 14.0–49.7)
MCH: 33.6 pg (ref 25.1–34.0)
MCHC: 33.8 g/dL (ref 31.5–36.0)
MCV: 99.6 fL (ref 79.5–101.0)
MONO#: 0.4 10*3/uL (ref 0.1–0.9)
MONO%: 10.5 % (ref 0.0–14.0)
NEUT%: 77.9 % — ABNORMAL HIGH (ref 38.4–76.8)
NEUTROS ABS: 3.3 10*3/uL (ref 1.5–6.5)
Platelets: 208 10*3/uL (ref 145–400)
RBC: 3.36 10*6/uL — ABNORMAL LOW (ref 3.70–5.45)
RDW: 16.7 % — AB (ref 11.2–14.5)
WBC: 4.2 10*3/uL (ref 3.9–10.3)
lymph#: 0.5 10*3/uL — ABNORMAL LOW (ref 0.9–3.3)

## 2017-01-03 MED ORDER — HEPARIN SOD (PORK) LOCK FLUSH 100 UNIT/ML IV SOLN
500.0000 [IU] | Freq: Once | INTRAVENOUS | Status: AC | PRN
  Administered 2017-01-03: 500 [IU]
  Filled 2017-01-03: qty 5

## 2017-01-03 MED ORDER — DIPHENHYDRAMINE HCL 25 MG PO CAPS
ORAL_CAPSULE | ORAL | Status: AC
  Filled 2017-01-03: qty 2

## 2017-01-03 MED ORDER — PALONOSETRON HCL INJECTION 0.25 MG/5ML
0.2500 mg | Freq: Once | INTRAVENOUS | Status: AC
  Administered 2017-01-03: 0.25 mg via INTRAVENOUS

## 2017-01-03 MED ORDER — PALONOSETRON HCL INJECTION 0.25 MG/5ML
INTRAVENOUS | Status: AC
  Filled 2017-01-03: qty 5

## 2017-01-03 MED ORDER — SODIUM CHLORIDE 0.9 % IV SOLN
Freq: Once | INTRAVENOUS | Status: AC
  Administered 2017-01-03: 12:00:00 via INTRAVENOUS

## 2017-01-03 MED ORDER — DOCETAXEL CHEMO INJECTION 160 MG/16ML
60.0000 mg/m2 | Freq: Once | INTRAVENOUS | Status: AC
  Administered 2017-01-03: 100 mg via INTRAVENOUS
  Filled 2017-01-03: qty 10

## 2017-01-03 MED ORDER — ACETAMINOPHEN 325 MG PO TABS
ORAL_TABLET | ORAL | Status: AC
  Filled 2017-01-03: qty 2

## 2017-01-03 MED ORDER — DEXAMETHASONE SODIUM PHOSPHATE 10 MG/ML IJ SOLN
10.0000 mg | Freq: Once | INTRAMUSCULAR | Status: AC
  Administered 2017-01-03: 10 mg via INTRAVENOUS

## 2017-01-03 MED ORDER — SODIUM CHLORIDE 0.9% FLUSH
10.0000 mL | INTRAVENOUS | Status: DC | PRN
  Administered 2017-01-03: 10 mL via INTRAVENOUS
  Filled 2017-01-03: qty 10

## 2017-01-03 MED ORDER — DIPHENHYDRAMINE HCL 25 MG PO CAPS
50.0000 mg | ORAL_CAPSULE | Freq: Once | ORAL | Status: AC
  Administered 2017-01-03: 50 mg via ORAL

## 2017-01-03 MED ORDER — SODIUM CHLORIDE 0.9% FLUSH
10.0000 mL | INTRAVENOUS | Status: DC | PRN
  Administered 2017-01-03: 10 mL
  Filled 2017-01-03: qty 10

## 2017-01-03 MED ORDER — PEGFILGRASTIM 6 MG/0.6ML ~~LOC~~ PSKT
6.0000 mg | PREFILLED_SYRINGE | Freq: Once | SUBCUTANEOUS | Status: AC
  Administered 2017-01-03: 6 mg via SUBCUTANEOUS
  Filled 2017-01-03: qty 0.6

## 2017-01-03 MED ORDER — ACETAMINOPHEN 325 MG PO TABS
650.0000 mg | ORAL_TABLET | Freq: Once | ORAL | Status: AC
  Administered 2017-01-03: 650 mg via ORAL

## 2017-01-03 MED ORDER — DEXAMETHASONE SODIUM PHOSPHATE 10 MG/ML IJ SOLN
INTRAMUSCULAR | Status: AC
  Filled 2017-01-03: qty 1

## 2017-01-03 MED ORDER — SODIUM CHLORIDE 0.9 % IV SOLN
477.5000 mg | Freq: Once | INTRAVENOUS | Status: AC
  Administered 2017-01-03: 480 mg via INTRAVENOUS
  Filled 2017-01-03: qty 48

## 2017-01-03 MED ORDER — TRASTUZUMAB CHEMO 150 MG IV SOLR
6.0000 mg/kg | Freq: Once | INTRAVENOUS | Status: AC
  Administered 2017-01-03: 357 mg via INTRAVENOUS
  Filled 2017-01-03: qty 17

## 2017-01-03 MED ORDER — SODIUM CHLORIDE 0.9 % IV SOLN
420.0000 mg | Freq: Once | INTRAVENOUS | Status: AC
  Administered 2017-01-03: 420 mg via INTRAVENOUS
  Filled 2017-01-03: qty 14

## 2017-01-03 NOTE — Patient Instructions (Signed)
[facility] Discharge Instructions for Patients Receiving Chemotherapy  Today you received the following chemotherapy agents Toxotere and Carboplatin  To help prevent nausea and vomiting after your treatment, we encourage you to take your nausea medication as directed   If you develop nausea and vomiting that is not controlled by your nausea medication, call the clinic.   BELOW ARE SYMPTOMS THAT SHOULD BE REPORTED IMMEDIATELY:  *FEVER GREATER THAN 100.5 F  *CHILLS WITH OR WITHOUT FEVER  NAUSEA AND VOMITING THAT IS NOT CONTROLLED WITH YOUR NAUSEA MEDICATION  *UNUSUAL SHORTNESS OF BREATH  *UNUSUAL BRUISING OR BLEEDING  TENDERNESS IN MOUTH AND THROAT WITH OR WITHOUT PRESENCE OF ULCERS  *URINARY PROBLEMS  *BOWEL PROBLEMS  UNUSUAL RASH Items with * indicate a potential emergency and should be followed up as soon as possible.  Feel free to call the clinic you have any questions or concerns. The clinic phone number is [phone].  Please show the [place] at check-in to the Emergency Department and triage nurse.

## 2017-01-03 NOTE — Patient Instructions (Signed)

## 2017-01-03 NOTE — Progress Notes (Signed)
[facility] Spiritual Care Note  After connecting with Jolayne Haines at [facility], followed up with her and her brother in infusion.  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  Per pt, her personal experience with cancer and professional work with pediatric trauma are collectively drawing her to some kind of support of children in an oncology setting.  We plan to f/u next week by phone to check in and continue pastoral reflection.  She also has our [facility]Finding Your New Normal) survivorship program on her radar, as this was her penultimate chemo tx.  As always, please page if immediate needs arise.  Thank you.   [place], [place], [facility] Pager [phone] Voicemail [phone]

## 2017-01-03 NOTE — Progress Notes (Signed)
Patient Care Team: Patient, No Pcp Per as PCP - General (General Practice) [name], MD as Consulting Physician (General Surgery) Nicholas Lose, MD as Consulting Physician (Hematology and Oncology) Eppie Gibson, MD as Attending Physician (Radiation Oncology) Gardenia Phlegm, NP as Nurse Practitioner (Hematology and Oncology)  DIAGNOSIS:  Encounter Diagnosis  Name Primary?  . Malignant neoplasm of upper-inner quadrant of left breast in female, estrogen receptor positive ([place])     SUMMARY OF ONCOLOGIC HISTORY:   Malignant neoplasm of upper-inner quadrant of left breast in female, estrogen receptor positive ([place])   09/14/2016 Initial Diagnosis    Palpable left breast mass for 2 months, ultrasound at 9:00: 4.7 x 3.6 x 3.3 cm, axilla negative: U/S Bx: Grade 2 IDC ER 95%, PR 50%, HER-2 positive ratio 6.17, Ki-67 20%, T2 N0 stage IIA clinical stage      09/27/2016 Breast MRI    Left breast: 3.5 cm irregular necrotic mass; left axillary lymph node 1.2 cm; right breast 0.6 cm mass at 6:00 position 0.5 cm mass right breast lower inner quadrant       10/11/2016 -  Neo-Adjuvant Chemotherapy    Taxotere, Carboplatin, Herceptin, Perjeta x 6 cycles followed by Herceptin and Perjeta maintenance for one year       CHIEF COMPLIANT: Cycle 5 TCH Perjeta  INTERVAL HISTORY: Miranda Woods is a 61 year old with above-mentioned history of left breast cancer currently on neoadjuvant chemotherapy with TCH Perjeta. Today is cycle 5 of her treatment. After cycle 4 she had prolonged fatigue that lasted 5-6 days. She also had nausea and diarrhea. She felt miserable for 7-10 days. She also had mild neuropathy of the tips of the fingers. The diarrhea did response to Imodium. Nausea was treated with Zofran which made her severely fatigued. But Compazine appeared to have helped her. She no longer has nausea at this time. Denies any current neuropathy.  REVIEW OF SYSTEMS:   Constitutional:  Denies fevers, chills or abnormal weight loss Eyes: Denies blurriness of vision Ears, nose, mouth, throat, and face: Denies mucositis or sore throat Respiratory: Denies cough, dyspnea or wheezes Cardiovascular: Denies palpitation, chest discomfort Gastrointestinal:  Nausea and diarrhea Skin: Denies abnormal skin rashes Lymphatics: Denies new lymphadenopathy or easy bruising Neurological:Denies numbness, tingling or new weaknesses Behavioral/Psych: Tearful and emotional  Extremities: No lower extremity edema  All other systems were reviewed with the patient and are negative.  I have reviewed the past medical history, past surgical history, social history and family history with the patient and they are unchanged from previous note.  ALLERGIES:  is allergic to codeine.  MEDICATIONS:  Current Outpatient Prescriptions  Medication Sig Dispense Refill  . clindamycin (CLINDAGEL) 1 % gel Apply to rash BID PRN. (Patient not taking: Reported on 11/28/2016) 30 g 1  . dexamethasone (DECADRON) 4 MG tablet Take 0.5 tablets (2 mg total) by mouth daily. 1/2 tab day before Taxotere. Then again the day after chemo for 1 day 10 tablet 0  . diphenhydrAMINE (BENADRYL) 25 MG tablet Take 1 tablet (25 mg total) by mouth every 6 (six) hours as needed for itching or allergies. (Patient not taking: Reported on 11/28/2016) 30 tablet 2  . famotidine (PEPCID) 20 MG tablet Take 1 tablet (20 mg total) by mouth 2 (two) times daily as needed for heartburn or indigestion. 30 tablet 2  . hydrOXYzine (ATARAX/VISTARIL) 10 MG tablet Take 5 mg by mouth as needed.    Marland Kitchen letrozole (FEMARA) 2.5 MG tablet Take 1 tablet (2.5 mg  total) by mouth daily. 90 tablet 3  . lidocaine-prilocaine (EMLA) cream Apply to affected area once 30 g 3  . LORazepam (ATIVAN) 0.5 MG tablet Take 1-2 tablets (0.5-1 mg total) by mouth every 8 (eight) hours as needed for anxiety or sleep. 60 tablet 0  . methylphenidate (RITALIN) 5 MG tablet Take 5 mg by mouth 2  (two) times daily as needed.     . ondansetron (ZOFRAN) 8 MG tablet Take 1 tablet (8 mg total) by mouth 2 (two) times daily as needed for refractory nausea / vomiting. Start on day 3 after chemo. 30 tablet 1  . prochlorperazine (COMPAZINE) 10 MG tablet Take 1 tablet (10 mg total) by mouth every 6 (six) hours as needed (Nausea or vomiting). (Patient not taking: Reported on 11/28/2016) 30 tablet 1  . UNABLE TO FIND Bone broth    . venlafaxine XR (EFFEXOR-XR) 37.5 MG 24 hr capsule Take 1 capsule (37.5 mg total) by mouth daily with breakfast. (Patient not taking: Reported on 11/28/2016) 30 capsule 3   No current facility-administered medications for this visit.     PHYSICAL EXAMINATION: ECOG PERFORMANCE STATUS: 1 - Symptomatic but completely ambulatory  Vitals:   01/03/17 1037  BP: 128/67  Pulse: 75  Resp: 18  Temp: 97.8 F (36.6 C)   Filed Weights   01/03/17 1037  Weight: 122 lb 6.4 oz (55.5 kg)    GENERAL:alert, no distress and comfortable SKIN: skin color, texture, turgor are normal, no rashes or significant lesions EYES: normal, Conjunctiva are pink and non-injected, sclera clear OROPHARYNX:no exudate, no erythema and lips, buccal mucosa, and tongue normal  NECK: supple, thyroid normal size, non-tender, without nodularity LYMPH:  no palpable lymphadenopathy in the cervical, axillary or inguinal LUNGS: clear to auscultation and percussion with normal breathing effort HEART: regular rate & rhythm and no murmurs and no lower extremity edema ABDOMEN:abdomen soft, non-tender and normal bowel sounds MUSCULOSKELETAL:no cyanosis of digits and no clubbing  NEURO: alert & oriented x 3 with fluent speech, no focal motor/sensory deficits EXTREMITIES: No lower extremity edema  LABORATORY DATA:  I have reviewed the data as listed   Chemistry      Component Value Date/Time   NA 140 12/13/2016 1001   K 3.9 12/13/2016 1001   CL 101 11/30/2011 1649   CO2 24 12/13/2016 1001   BUN 11.2  12/13/2016 1001   CREATININE 0.5 (L) 12/13/2016 1001      Component Value Date/Time   CALCIUM 9.3 12/13/2016 1001   ALKPHOS 58 12/13/2016 1001   AST 20 12/13/2016 1001   ALT 34 12/13/2016 1001   BILITOT 0.48 12/13/2016 1001       Lab Results  Component Value Date   WBC 4.2 01/03/2017   HGB 11.3 (L) 01/03/2017   HCT 33.5 (L) 01/03/2017   MCV 99.6 01/03/2017   PLT 208 01/03/2017   NEUTROABS 3.3 01/03/2017    ASSESSMENT & PLAN:  Malignant neoplasm of upper-inner quadrant of left breast in female, estrogen receptor positive ([facility]) Palpable left breast mass for 2 months, ultrasound at 9:00: 4.7 x 3.6 x 3.3 cm, axilla negative: U/S Bx: 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 Neoadjuvant chemotherapy with TCH Perjeta 6 cycles followed by Herceptin and Perjetamaintenance for 1 year 2. Followed by breast conserving surgery if possible with sentinel lymph node study 3. Followed by adjuvant radiation therapy if patient had lumpectomy 4. Followed by adjuvant antiestrogen therapy ------------------------------------------------------------------------------ Current Treatment: TCHP cycle 5 (neulasta D/C ed) Chemo Toxicities: 1. Hair loss 2. Depression:  we prescribed Effexor XR 37.5 mg daily but patient decided to not take it. 3. Anemia grade 1 4. Nausea and diarrhea 5. Severe fatigue  Blood work appears to be stable. I recommended that we reduced the dosage of Taxotere and carboplatin today. I instructed her to call us if she developed profound diarrhea because we may have to give her IV fluids.  I spent  25 minutes talking to the patient of which more than half was spent in counseling and coordination of care.  No orders of the defined types were placed in this encounter.  The patient has a good understanding of the overall plan. she agrees with it. she will call with any problems that may develop before the next visit here.   Rulon Eisenmenger, MD 01/03/17

## 2017-01-08 ENCOUNTER — Ambulatory Visit (HOSPITAL_BASED_OUTPATIENT_CLINIC_OR_DEPARTMENT_OTHER)
Admission: RE | Admit: 2017-01-08 | Discharge: 2017-01-08 | Disposition: A | Discharge status: Home / Self Care | Payer: BLUE CROSS/BLUE SHIELD | Source: Ambulatory Visit | Attending: Internal Medicine | Admitting: Internal Medicine

## 2017-01-08 ENCOUNTER — Encounter (HOSPITAL_COMMUNITY): Payer: Self-pay | Admitting: Internal Medicine

## 2017-01-08 ENCOUNTER — Encounter: Payer: Self-pay | Admitting: *Deleted

## 2017-01-08 ENCOUNTER — Other Ambulatory Visit: Payer: Self-pay | Admitting: *Deleted

## 2017-01-08 ENCOUNTER — Encounter: Payer: Self-pay | Admitting: Adult Health

## 2017-01-08 ENCOUNTER — Other Ambulatory Visit: Payer: Self-pay | Admitting: Adult Health

## 2017-01-08 ENCOUNTER — Ambulatory Visit (HOSPITAL_COMMUNITY)
Admission: RE | Admit: 2017-01-08 | Discharge: 2017-01-08 | Disposition: A | Discharge status: Home / Self Care | Payer: BLUE CROSS/BLUE SHIELD | Source: Ambulatory Visit | Attending: Internal Medicine | Admitting: Internal Medicine

## 2017-01-08 VITALS — BP 131/66 | HR 66 | Wt 119.8 lb

## 2017-01-08 DIAGNOSIS — Z170 Estrogen receptor positive status [ER+]: Principal | ICD-10-CM

## 2017-01-08 DIAGNOSIS — I083 Combined rheumatic disorders of mitral, aortic and tricuspid valves: Secondary | ICD-10-CM | POA: Insufficient documentation

## 2017-01-08 DIAGNOSIS — N6019 Diffuse cystic mastopathy of unspecified breast: Secondary | ICD-10-CM | POA: Insufficient documentation

## 2017-01-08 DIAGNOSIS — F909 Attention-deficit hyperactivity disorder, unspecified type: Secondary | ICD-10-CM | POA: Diagnosis not present

## 2017-01-08 DIAGNOSIS — I5030 Unspecified diastolic (congestive) heart failure: Secondary | ICD-10-CM | POA: Diagnosis not present

## 2017-01-08 DIAGNOSIS — Z9889 Other specified postprocedural states: Secondary | ICD-10-CM | POA: Insufficient documentation

## 2017-01-08 DIAGNOSIS — C50212 Malignant neoplasm of upper-inner quadrant of left female breast: Secondary | ICD-10-CM | POA: Diagnosis not present

## 2017-01-08 DIAGNOSIS — Z885 Allergy status to narcotic agent status: Secondary | ICD-10-CM | POA: Diagnosis not present

## 2017-01-08 DIAGNOSIS — Z8349 Family history of other endocrine, nutritional and metabolic diseases: Secondary | ICD-10-CM | POA: Diagnosis not present

## 2017-01-08 DIAGNOSIS — Z87891 Personal history of nicotine dependence: Secondary | ICD-10-CM | POA: Insufficient documentation

## 2017-01-08 DIAGNOSIS — Z8249 Family history of ischemic heart disease and other diseases of the circulatory system: Secondary | ICD-10-CM | POA: Insufficient documentation

## 2017-01-08 MED ORDER — LORAZEPAM 0.5 MG PO TABS: 0.5000 mg | ORAL_TABLET | Freq: Three times a day (TID) | ORAL | 0 refills | Status: DC | PRN

## 2017-01-08 MED ORDER — TRIAMCINOLONE ACETONIDE 0.5 % EX CREA: 1.0000 "application " | TOPICAL_CREAM | Freq: Two times a day (BID) | CUTANEOUS | 0 refills | Status: DC

## 2017-01-08 NOTE — Patient Instructions (Signed)
3 Months follow up with Echo, we will contact you to schedule appointment.

## 2017-01-08 NOTE — Progress Notes (Signed)
  Echocardiogram 2D Echocardiogram has been performed.  Miranda Woods 01/08/2017, 9:59 AM

## 2017-01-08 NOTE — Progress Notes (Signed)
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 Lindi Adie for enrollment into the Cardio-Oncology program.  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 Overall doing well. Does have some fatigue. Remains active with walking (about 2 miles per day) and occasional sprints. No HF symptoms. Works as a Transport planner.   Father died at 42 from [facility]  Brother died 98 from MI (non-smoker; h/o substance abuse)   ECHO: 01/08/2017 EF 60-65% LS' 10.8 cm/s GLS -22.7%    Reviewed personally.    Review of Systems: [y] = yes, '[ ]'$  = no   General: Weight gain '[ ]'$ ; Weight loss '[ ]'$ ; Anorexia '[ ]'$ ; Fatigue '[ ]'$ ; Fever '[ ]'$ ; Chills '[ ]'$ ; Weakness '[ ]'$   Cardiac: Chest pain/pressure '[ ]'$ ; Resting SOB '[ ]'$ ; Exertional SOB '[ ]'$ ; Orthopnea '[ ]'$ ; Pedal Edema '[ ]'$ ; Palpitations '[ ]'$ ; Syncope '[ ]'$ ; Presyncope '[ ]'$ ; Paroxysmal nocturnal dyspnea'[ ]'$   Pulmonary: Cough '[ ]'$ ; Wheezing'[ ]'$ ; Hemoptysis'[ ]'$ ; Sputum '[ ]'$ ; Snoring '[ ]'$   GI: Vomiting'[ ]'$ ; Dysphagia'[ ]'$ ; Melena'[ ]'$ ; Hematochezia '[ ]'$ ; Heartburn'[ ]'$ ; Abdominal pain '[ ]'$ ;  Constipation '[ ]'$ ; Diarrhea '[ ]'$ ; BRBPR '[ ]'$   GU: Hematuria'[ ]'$ ; Dysuria '[ ]'$ ; Nocturia'[ ]'$   Vascular: Pain in legs with walking '[ ]'$ ; Pain in feet with lying flat '[ ]'$ ; Non-healing sores '[ ]'$ ; Stroke '[ ]'$ ; TIA '[ ]'$ ; Slurred speech '[ ]'$ ;  Neuro: Headaches'[ ]'$ ; Vertigo'[ ]'$ ; Seizures'[ ]'$ ; Paresthesias'[ ]'$ ;Blurred vision '[ ]'$ ; Diplopia '[ ]'$ ; Vision changes '[ ]'$   Ortho/Skin: Arthritis '[ ]'$ ; Joint pain '[ ]'$ ; Muscle pain '[ ]'$ ; Joint swelling '[ ]'$ ; Back Pain '[ ]'$ ; Rash '[ ]'$   Psych: Depression[ y]; Anxiety[ y]  Heme: Bleeding problems '[ ]'$ ; Clotting disorders '[ ]'$ ; Anemia '[ ]'$   Endocrine: Diabetes '[ ]'$ ; Thyroid dysfunction'[ ]'$    Past Medical History:  Diagnosis Date  . ADHD (attention deficit hyperactivity disorder)   . Fibrocystic breast disease     Current Outpatient Prescriptions  Medication Sig Dispense Refill  . lidocaine-prilocaine (EMLA) cream Apply to affected area once 30 g 3  . LORazepam (ATIVAN) 0.5 MG tablet Take 1-2 tablets (0.5-1 mg total) by mouth every 8 (eight) hours as needed for anxiety or sleep. 60 tablet 0  . clindamycin (CLINDAGEL) 1 % gel Apply to rash BID PRN. (Patient not taking: Reported on 11/28/2016) 30 g 1  . dexamethasone (DECADRON) 4 MG tablet Take 0.5 tablets (2 mg total) by mouth daily. 1/2 tab day  before Taxotere. Then again the day after chemo for 1 day (Patient not taking: Reported on 01/08/2017) 10 tablet 0  . diphenhydrAMINE (BENADRYL) 25 MG tablet Take 1 tablet (25 mg total) by mouth every 6 (six) hours as needed for itching or allergies. (Patient not taking: Reported on 11/28/2016) 30 tablet 2  . famotidine (PEPCID) 20 MG tablet Take 1 tablet (20 mg total) by mouth 2 (two) times daily as needed for heartburn or indigestion. (Patient not taking: Reported on 01/08/2017) 30 tablet 2  . ondansetron (ZOFRAN) 8 MG tablet Take 1 tablet (8 mg total) by mouth 2 (two) times daily as needed for refractory nausea / vomiting. Start on day 3 after chemo. (Patient not taking: Reported on 01/08/2017) 30 tablet 1    . prochlorperazine (COMPAZINE) 10 MG tablet Take 1 tablet (10 mg total) by mouth every 6 (six) hours as needed (Nausea or vomiting). (Patient not taking: Reported on 11/28/2016) 30 tablet 1   No current facility-administered medications for this encounter.     Allergies  Allergen Reactions  . Codeine Hives and Itching    Tolerates hydrocodone      Social History   Social History  . Marital status: Single    Spouse name: Miranda Woods  . Number of children: 0  . Years of education: N/A   Occupational History  . counselor    Social History Main Topics  . Smoking status: Former Smoker    Types: Cigarettes  . Smokeless tobacco: Never Used  . Alcohol use 0.0 oz/week  . Drug use: No  . Sexual activity: Yes    Partners: Male    Birth control/ protection: Post-menopausal   Other Topics Concern  . Not on file   Social History Narrative  . No narrative on file      Family History  Problem Relation Age of Onset  . Heart disease Father   . Hypertension Father   . Thyroid disease Mother     Vitals:   01/08/17 1010  BP: 131/66  Pulse: 66  SpO2: 100%  Weight: 119 lb 12 oz (54.3 kg)    PHYSICAL EXAM: General:  Well appearing. No respiratory difficulty HEENT: normal x for alopecia Neck: supple. no JVD. Carotids 2+ bilat; no bruits. No lymphadenopathy or thryomegaly appreciated. Cor: PMI nondisplaced. Regular rate & rhythm. No rubs, gallops or murmurs. Lungs: clear Abdomen: soft, nontender, nondistended. No hepatosplenomegaly. No bruits or masses. Good bowel sounds. Extremities: no cyanosis, clubbing, rash, edema Neuro: alert & oriented x 3, cranial nerves grossly intact. moves all 4 extremities w/o difficulty. Affect pleasant.   ASSESSMENT & PLAN: 1. Malignant neoplasm of upper-inner quadrant of left breast in female, estrogen receptor positive ([place]) HER-2+ --Palpable left breast mass for 2 months, ultrasound at 9:00: 4.7 x 3.6 x 3.3 cm, axilla negative: U/S Bx: Grade  2 IDC ER 95%, PR 50%, HER-2 positive ratio 6.17, Ki-67 20%,  --T2 N0 stage IIAclinical stage (could be stage IIB if the lymph node in the left axilla is positive) --Explained incidence of Herceptin cardiotoxicity and role of Cardio-oncology clinic at length. Echo images reviewed personally. All parameters stable. Reviewed signs and symptoms of HF to look for. Continue Herceptin. Follow-up with echo in 3 months.   [name], MD  10:43 AM

## 2017-01-08 NOTE — Progress Notes (Signed)
Per Mendel Ryder, NP called in a refill of Lorazepam into [facility] on [address]- refill order given to [place], Occupational psychologist.

## 2017-01-08 NOTE — Pre-Procedure Instructions (Signed)
Arm lesion June 11

## 2017-01-09 ENCOUNTER — Encounter: Payer: Self-pay | Admitting: General Practice

## 2017-01-10 NOTE — Progress Notes (Signed)
[facility] Spiritual Care Note  Reached Miranda Woods by phone per our f/u plan.  Per pt, her energy and GI distress were better after her most recent tx than after the previous one.  She is using her energy to see clients, make healthy food, and stay active between recovery naps.  Miranda Woods used the conversation well to process cancer adjustment, family relationships, and her role as a foster parent.  Following for support, and Miranda Woods knows to contact Support Team as needs arise.   [place], [place], [facility] Pager [phone] Voicemail [phone]

## 2017-01-23 NOTE — Assessment & Plan Note (Signed)
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 Neoadjuvant chemotherapy with TCH Perjeta 6 cycles followed by Herceptin and Perjetamaintenance for 1 year 2. Followed by breast conserving surgery if possible with sentinel lymph node study 3. Followed by adjuvant radiation therapy if patient had lumpectomy 4. Followed by adjuvant antiestrogen therapy ------------------------------------------------------------------------------ Current Treatment: TCHP cycle 6 (neulasta D/C ed) Chemo Toxicities: 1. Hair loss 2. Depression: we prescribed Effexor XR 37.5 mg dailybut patient decided to not take it. 3. Anemia grade 1 4. Nausea and diarrhea 5. Severe fatigue  Blood work appears to be stable. I recommended that we reduced the dosage of Taxotere and carboplatin today. I instructed her to call us if she developed profound diarrhea because we may have to give her IV fluids.

## 2017-01-24 ENCOUNTER — Other Ambulatory Visit (HOSPITAL_BASED_OUTPATIENT_CLINIC_OR_DEPARTMENT_OTHER): Payer: BLUE CROSS/BLUE SHIELD

## 2017-01-24 ENCOUNTER — Encounter: Payer: Self-pay | Admitting: *Deleted

## 2017-01-24 ENCOUNTER — Ambulatory Visit (HOSPITAL_BASED_OUTPATIENT_CLINIC_OR_DEPARTMENT_OTHER): Payer: BLUE CROSS/BLUE SHIELD | Admitting: Hematology and Oncology

## 2017-01-24 ENCOUNTER — Ambulatory Visit: Payer: BLUE CROSS/BLUE SHIELD

## 2017-01-24 ENCOUNTER — Ambulatory Visit (HOSPITAL_BASED_OUTPATIENT_CLINIC_OR_DEPARTMENT_OTHER): Payer: BLUE CROSS/BLUE SHIELD

## 2017-01-24 ENCOUNTER — Encounter: Payer: Self-pay | Admitting: Hematology and Oncology

## 2017-01-24 DIAGNOSIS — Z170 Estrogen receptor positive status [ER+]: Secondary | ICD-10-CM

## 2017-01-24 DIAGNOSIS — C50212 Malignant neoplasm of upper-inner quadrant of left female breast: Secondary | ICD-10-CM

## 2017-01-24 DIAGNOSIS — Z5112 Encounter for antineoplastic immunotherapy: Secondary | ICD-10-CM | POA: Diagnosis not present

## 2017-01-24 DIAGNOSIS — Z5111 Encounter for antineoplastic chemotherapy: Secondary | ICD-10-CM | POA: Diagnosis not present

## 2017-01-24 DIAGNOSIS — D6481 Anemia due to antineoplastic chemotherapy: Secondary | ICD-10-CM | POA: Diagnosis not present

## 2017-01-24 DIAGNOSIS — Z5189 Encounter for other specified aftercare: Secondary | ICD-10-CM

## 2017-01-24 DIAGNOSIS — Z95828 Presence of other vascular implants and grafts: Secondary | ICD-10-CM

## 2017-01-24 LAB — COMPREHENSIVE METABOLIC PANEL
ALBUMIN: 3.9 g/dL (ref 3.5–5.0)
ALT: 20 U/L (ref 0–55)
AST: 14 U/L (ref 5–34)
Alkaline Phosphatase: 57 U/L (ref 40–150)
Anion Gap: 8 mEq/L (ref 3–11)
BUN: 13.4 mg/dL (ref 7.0–26.0)
CALCIUM: 9.7 mg/dL (ref 8.4–10.4)
CO2: 26 mEq/L (ref 22–29)
Chloride: 106 mEq/L (ref 98–109)
Creatinine: 0.6 mg/dL (ref 0.6–1.1)
Glucose: 98 mg/dl (ref 70–140)
POTASSIUM: 3.8 meq/L (ref 3.5–5.1)
SODIUM: 140 meq/L (ref 136–145)
Total Bilirubin: 0.6 mg/dL (ref 0.20–1.20)
Total Protein: 6.6 g/dL (ref 6.4–8.3)

## 2017-01-24 LAB — CBC WITH DIFFERENTIAL/PLATELET
BASO%: 0.2 % (ref 0.0–2.0)
Basophils Absolute: 0 10*3/uL (ref 0.0–0.1)
EOS%: 0.2 % (ref 0.0–7.0)
Eosinophils Absolute: 0 10*3/uL (ref 0.0–0.5)
HCT: 34.6 % — ABNORMAL LOW (ref 34.8–46.6)
HGB: 11.4 g/dL — ABNORMAL LOW (ref 11.6–15.9)
LYMPH%: 22.4 % (ref 14.0–49.7)
MCH: 33.2 pg (ref 25.1–34.0)
MCHC: 32.9 g/dL (ref 31.5–36.0)
MCV: 100.9 fL (ref 79.5–101.0)
MONO#: 0.7 10*3/uL (ref 0.1–0.9)
MONO%: 15.4 % — AB (ref 0.0–14.0)
NEUT%: 61.8 % (ref 38.4–76.8)
NEUTROS ABS: 2.9 10*3/uL (ref 1.5–6.5)
PLATELETS: 180 10*3/uL (ref 145–400)
RBC: 3.43 10*6/uL — AB (ref 3.70–5.45)
RDW: 14.6 % — ABNORMAL HIGH (ref 11.2–14.5)
WBC: 4.7 10*3/uL (ref 3.9–10.3)
lymph#: 1.1 10*3/uL (ref 0.9–3.3)

## 2017-01-24 MED ORDER — DEXTROSE 5 % IV SOLN
60.0000 mg/m2 | Freq: Once | INTRAVENOUS | Status: AC
  Administered 2017-01-24: 100 mg via INTRAVENOUS
  Filled 2017-01-24: qty 10

## 2017-01-24 MED ORDER — PEGFILGRASTIM 6 MG/0.6ML ~~LOC~~ PSKT
6.0000 mg | PREFILLED_SYRINGE | Freq: Once | SUBCUTANEOUS | Status: AC
  Administered 2017-01-24: 6 mg via SUBCUTANEOUS
  Filled 2017-01-24: qty 0.6

## 2017-01-24 MED ORDER — SODIUM CHLORIDE 0.9 % IV SOLN
477.5000 mg | Freq: Once | INTRAVENOUS | Status: AC
  Administered 2017-01-24: 480 mg via INTRAVENOUS
  Filled 2017-01-24: qty 48

## 2017-01-24 MED ORDER — SODIUM CHLORIDE 0.9 % IV SOLN
420.0000 mg | Freq: Once | INTRAVENOUS | Status: AC
  Administered 2017-01-24: 420 mg via INTRAVENOUS
  Filled 2017-01-24: qty 14

## 2017-01-24 MED ORDER — HEPARIN SOD (PORK) LOCK FLUSH 100 UNIT/ML IV SOLN
500.0000 [IU] | Freq: Once | INTRAVENOUS | Status: AC | PRN
  Administered 2017-01-24: 500 [IU]
  Filled 2017-01-24: qty 5

## 2017-01-24 MED ORDER — SODIUM CHLORIDE 0.9% FLUSH
10.0000 mL | INTRAVENOUS | Status: DC | PRN
  Administered 2017-01-24: 10 mL
  Filled 2017-01-24: qty 10

## 2017-01-24 MED ORDER — DEXAMETHASONE SODIUM PHOSPHATE 10 MG/ML IJ SOLN
INTRAMUSCULAR | Status: AC
  Filled 2017-01-24: qty 1

## 2017-01-24 MED ORDER — SODIUM CHLORIDE 0.9% FLUSH
10.0000 mL | INTRAVENOUS | Status: AC | PRN
Start: 2017-01-24 — End: ?
  Administered 2017-01-24: 10 mL via INTRAVENOUS
  Filled 2017-01-24: qty 10

## 2017-01-24 MED ORDER — DIPHENHYDRAMINE HCL 25 MG PO CAPS
50.0000 mg | ORAL_CAPSULE | Freq: Once | ORAL | Status: AC
  Administered 2017-01-24: 50 mg via ORAL

## 2017-01-24 MED ORDER — SODIUM CHLORIDE 0.9 % IV SOLN
Freq: Once | INTRAVENOUS | Status: AC
  Administered 2017-01-24: 14:00:00 via INTRAVENOUS

## 2017-01-24 MED ORDER — DEXAMETHASONE SODIUM PHOSPHATE 10 MG/ML IJ SOLN
10.0000 mg | Freq: Once | INTRAMUSCULAR | Status: AC
  Administered 2017-01-24: 10 mg via INTRAVENOUS

## 2017-01-24 MED ORDER — PALONOSETRON HCL INJECTION 0.25 MG/5ML
INTRAVENOUS | Status: AC
  Filled 2017-01-24: qty 5

## 2017-01-24 MED ORDER — DIPHENHYDRAMINE HCL 25 MG PO CAPS
ORAL_CAPSULE | ORAL | Status: AC
  Filled 2017-01-24: qty 2

## 2017-01-24 MED ORDER — SODIUM CHLORIDE 0.9 % IV SOLN
6.0000 mg/kg | Freq: Once | INTRAVENOUS | Status: AC
  Administered 2017-01-24: 357 mg via INTRAVENOUS
  Filled 2017-01-24: qty 17

## 2017-01-24 MED ORDER — PALONOSETRON HCL INJECTION 0.25 MG/5ML
0.2500 mg | Freq: Once | INTRAVENOUS | Status: AC
Start: 2017-01-24 — End: 2017-01-24
  Administered 2017-01-24: 0.25 mg via INTRAVENOUS

## 2017-01-24 MED ORDER — ACETAMINOPHEN 325 MG PO TABS
ORAL_TABLET | ORAL | Status: AC
  Filled 2017-01-24: qty 2

## 2017-01-24 MED ORDER — ACETAMINOPHEN 325 MG PO TABS
650.0000 mg | ORAL_TABLET | Freq: Once | ORAL | Status: AC
  Administered 2017-01-24: 650 mg via ORAL

## 2017-01-24 NOTE — Progress Notes (Signed)
Patient Care Team: Patient, No Pcp Per as PCP - General (General Practice) [name], MD as Consulting Physician (General Surgery) Nicholas Lose, MD as Consulting Physician (Hematology and Oncology) Eppie Gibson, MD as Attending Physician (Radiation Oncology) Gardenia Phlegm, NP as Nurse Practitioner (Hematology and Oncology)  DIAGNOSIS:  Encounter Diagnosis  Name Primary?  . Malignant neoplasm of upper-inner quadrant of left breast in female, estrogen receptor positive ([place])     SUMMARY OF ONCOLOGIC HISTORY:   Malignant neoplasm of upper-inner quadrant of left breast in female, estrogen receptor positive ([place])   09/14/2016 Initial Diagnosis    Palpable left breast mass for 2 months, ultrasound at 9:00: 4.7 x 3.6 x 3.3 cm, axilla negative: U/S Bx: Grade 2 IDC ER 95%, PR 50%, HER-2 positive ratio 6.17, Ki-67 20%, T2 N0 stage IIA clinical stage      09/27/2016 Breast MRI    Left breast: 3.5 cm irregular necrotic mass; left axillary lymph node 1.2 cm; right breast 0.6 cm mass at 6:00 position 0.5 cm mass right breast lower inner quadrant       10/11/2016 -  Neo-Adjuvant Chemotherapy    Taxotere, Carboplatin, Herceptin, Perjeta x 6 cycles followed by Herceptin and Perjeta maintenance for one year       CHIEF COMPLIANT: Cycle 6 TCH Perjeta  INTERVAL HISTORY: Miranda Woods is a 61 year old with above-mentioned history of left breast cancer currently on neoadjuvant chemotherapy and today is cycle 6 of TCH Perjeta. I decreased the dosage of Taxotere with the last cycle and today she tells me that she did very well from chemotherapy. She did not have any nausea vomiting. Her weight has remained stable. Denies any neuropathy. There are significant nail changes especially in the feet there is discoloration of the nail beds.  REVIEW OF SYSTEMS:   Constitutional: Denies fevers, chills or abnormal weight loss Eyes: Denies blurriness of vision Ears, nose, mouth, throat, and  face: Denies mucositis or sore throat Respiratory: Denies cough, dyspnea or wheezes Cardiovascular: Denies palpitation, chest discomfort Gastrointestinal:  Denies nausea, heartburn or change in bowel habits Skin: Denies abnormal skin rashes Lymphatics: Denies new lymphadenopathy or easy bruising Neurological:Denies numbness, tingling or new weaknesses Behavioral/Psych: Mood is stable, no new changes  Extremities: Nail discoloration  All other systems were reviewed with the patient and are negative.  I have reviewed the past medical history, past surgical history, social history and family history with the patient and they are unchanged from previous note.  ALLERGIES:  is allergic to codeine.  MEDICATIONS:  Current Outpatient Prescriptions  Medication Sig Dispense Refill  . clindamycin (CLINDAGEL) 1 % gel Apply to rash BID PRN. (Patient not taking: Reported on 11/28/2016) 30 g 1  . dexamethasone (DECADRON) 4 MG tablet Take 0.5 tablets (2 mg total) by mouth daily. 1/2 tab day before Taxotere. Then again the day after chemo for 1 day (Patient not taking: Reported on 01/08/2017) 10 tablet 0  . diphenhydrAMINE (BENADRYL) 25 MG tablet Take 1 tablet (25 mg total) by mouth every 6 (six) hours as needed for itching or allergies. (Patient not taking: Reported on 11/28/2016) 30 tablet 2  . famotidine (PEPCID) 20 MG tablet Take 1 tablet (20 mg total) by mouth 2 (two) times daily as needed for heartburn or indigestion. (Patient not taking: Reported on 01/08/2017) 30 tablet 2  . lidocaine-prilocaine (EMLA) cream Apply to affected area once 30 g 3  . LORazepam (ATIVAN) 0.5 MG tablet Take 1-2 tablets (0.5-1 mg total) by mouth  every 8 (eight) hours as needed for anxiety or sleep. 60 tablet 0  . ondansetron (ZOFRAN) 8 MG tablet Take 1 tablet (8 mg total) by mouth 2 (two) times daily as needed for refractory nausea / vomiting. Start on day 3 after chemo. (Patient not taking: Reported on 01/08/2017) 30 tablet 1  .  prochlorperazine (COMPAZINE) 10 MG tablet Take 1 tablet (10 mg total) by mouth every 6 (six) hours as needed (Nausea or vomiting). (Patient not taking: Reported on 11/28/2016) 30 tablet 1  . triamcinolone cream (KENALOG) 0.5 % Apply 1 application topically 2 (two) times daily. 30 g 0   No current facility-administered medications for this visit.    Facility-Administered Medications Ordered in Other Visits  Medication Dose Route Frequency Provider Last Rate Last Dose  . sodium chloride flush (NS) 0.9 % injection 10 mL  10 mL Intravenous PRN Nicholas Lose, MD   10 mL at 01/24/17 1116    PHYSICAL EXAMINATION: ECOG PERFORMANCE STATUS: 1 - Symptomatic but completely ambulatory  Vitals:   01/24/17 1137  BP: 123/62  Pulse: 67  Resp: 18  Temp: 97.8 F (36.6 C)   Filed Weights   01/24/17 1137  Weight: 120 lb 14.4 oz (54.8 kg)    GENERAL:alert, no distress and comfortable SKIN: skin color, texture, turgor are normal, no rashes or significant lesions EYES: normal, Conjunctiva are pink and non-injected, sclera clear OROPHARYNX:no exudate, no erythema and lips, buccal mucosa, and tongue normal  NECK: supple, thyroid normal size, non-tender, without nodularity LYMPH:  no palpable lymphadenopathy in the cervical, axillary or inguinal LUNGS: clear to auscultation and percussion with normal breathing effort HEART: regular rate & rhythm and no murmurs and no lower extremity edema ABDOMEN:abdomen soft, non-tender and normal bowel sounds MUSCULOSKELETAL:no cyanosis of digits and no clubbing  NEURO: alert & oriented x 3 with fluent speech, no focal motor/sensory deficits EXTREMITIES: No lower extremity edema  LABORATORY DATA:  I have reviewed the data as listed   Chemistry      Component Value Date/Time   NA 140 01/24/2017 1108   K 3.8 01/24/2017 1108   CL 101 11/30/2011 1649   CO2 26 01/24/2017 1108   BUN 13.4 01/24/2017 1108   CREATININE 0.6 01/24/2017 1108      Component Value  Date/Time   CALCIUM 9.7 01/24/2017 1108   ALKPHOS 57 01/24/2017 1108   AST 14 01/24/2017 1108   ALT 20 01/24/2017 1108   BILITOT 0.60 01/24/2017 1108       Lab Results  Component Value Date   WBC 4.7 01/24/2017   HGB 11.4 (L) 01/24/2017   HCT 34.6 (L) 01/24/2017   MCV 100.9 01/24/2017   PLT 180 01/24/2017   NEUTROABS 2.9 01/24/2017    ASSESSMENT & PLAN:  Malignant neoplasm of upper-inner quadrant of left breast in female, estrogen receptor positive ([facility]) Palpable left breast mass for 2 months, ultrasound at 9:00: 4.7 x 3.6 x 3.3 cm, axilla negative: U/S Bx: Grade 2 IDC ER 95%, PR 50%, HER-2 positive ratio 6.17, Ki-67 20%,   MRI breast 09/28/2016: Left breast: 3.5 cm irregular necrotic mass; left axillary lymph node 1.2 cm; right breast 0.6 cm mass at 6:00 position 0.5 cm mass right breast lower inner quadrant  T2 N0 stage IIAclinical stage (could be stage IIB if the lymph node in the left axilla is positive)  Recommendationbased on multidisciplinary tumor board: 1. Neoadjuvant chemotherapy with TCH Perjeta 6 cycles followed by Herceptin and Perjetamaintenance for 1 year 2. Followed  by breast conserving surgery if possible with sentinel lymph node study 3. Followed by adjuvant radiation therapy if patient had lumpectomy 4. Followed by adjuvant antiestrogen therapy ------------------------------------------------------------------------------ Current Treatment: TCHP cycle 6 (neulasta D/C ed) Chemo Toxicities: 1. Hair loss 2. Depression: we prescribed Effexor XR 37.5 mg dailybut patient decided to not take it. 3. Anemia grade 1 4. Nausea and diarrhea 5. Severe fatigue 6. Diarrhea:  under good control   Blood work is stable. Patient has a breast MRI scheduled and I will see her back afterwards to review the results.    I spent 25 minutes talking to the patient of which more than half was spent in counseling and coordination of care.  No orders of the defined  types were placed in this encounter.  The patient has a good understanding of the overall plan. she agrees with it. she will call with any problems that may develop before the next visit here.   Rulon Eisenmenger, MD 01/24/17

## 2017-01-24 NOTE — Patient Instructions (Addendum)
[facility] Discharge Instructions for Patients Receiving Chemotherapy  Today you received the following chemotherapy agents Herceptin, Perjeta, Taxotere, and Carboplatin.  To help prevent nausea and vomiting after your treatment, we encourage you to take your nausea medication as directed . NO zofran for 3 days since you had Aloxi   If you develop nausea and vomiting that is not controlled by your nausea medication, call the clinic.   BELOW ARE SYMPTOMS THAT SHOULD BE REPORTED IMMEDIATELY:  *FEVER GREATER THAN 100.5 F  *CHILLS WITH OR WITHOUT FEVER  NAUSEA AND VOMITING THAT IS NOT CONTROLLED WITH YOUR NAUSEA MEDICATION  *UNUSUAL SHORTNESS OF BREATH  *UNUSUAL BRUISING OR BLEEDING  TENDERNESS IN MOUTH AND THROAT WITH OR WITHOUT PRESENCE OF ULCERS  *URINARY PROBLEMS  *BOWEL PROBLEMS  UNUSUAL RASH Items with * indicate a potential emergency and should be followed up as soon as possible.  Feel free to call the clinic you have any questions or concerns. The clinic phone number is [phone].  Please show the [facility] at check-in to the Emergency Department and triage nurse.  Palonosetron Injection What is this medicine? PALONOSETRON (pal oh NOE se tron) is used to prevent nausea and vomiting caused by chemotherapy. It also helps prevent delayed nausea and vomiting that may occur a few days after your treatment. This medicine may be used for other purposes; ask your health care provider or pharmacist if you have questions. COMMON BRAND NAME(S): Aloxi What should I tell my health care provider before I take this medicine? They need to know if you have any of these conditions: -an unusual or allergic reaction to palonosetron, dolasetron, granisetron, ondansetron, other medicines, foods, dyes, or preservatives -pregnant or trying to get pregnant -breast-feeding How should I use this medicine? This medicine is for infusion into a vein. It is given by a  health care professional in a hospital or clinic setting. Talk to your pediatrician regarding the use of this medicine in children. While this drug may be prescribed for children as young as 1 month for selected conditions, precautions do apply. Overdosage: If you think you have taken too much of this medicine contact a poison control center or emergency room at once. NOTE: This medicine is only for you. Do not share this medicine with others. What if I miss a dose? This does not apply. What may interact with this medicine? -certain medicines for depression, anxiety, or psychotic disturbances -fentanyl -linezolid -MAOIs like Carbex, Eldepryl, Marplan, Nardil, and Parnate -methylene blue (injected into a vein) -tramadol This list may not describe all possible interactions. Give your health care provider a list of all the medicines, herbs, non-prescription drugs, or dietary supplements you use. Also tell them if you smoke, drink alcohol, or use illegal drugs. Some items may interact with your medicine. What should I watch for while using this medicine? Your condition will be monitored carefully while you are receiving this medicine. What side effects may I notice from receiving this medicine? Side effects that you should report to your doctor or health care professional as soon as possible: -allergic reactions like skin rash, itching or hives, swelling of the face, lips, or tongue -breathing problems -confusion -dizziness -fast, irregular heartbeat -fever and chills -loss of balance or coordination -seizures -sweating -swelling of the hands and feet -tremors -unusually weak or tired Side effects that usually do not require medical attention (report to your doctor or health care professional if they continue or are bothersome): -constipation or diarrhea -headache  This list may not describe all possible side effects. Call your doctor for medical advice about side effects. You may report  side effects to FDA at 1-800-FDA-1088. Where should I keep my medicine? This drug is given in a hospital or clinic and will not be stored at home. NOTE: This sheet is a summary. It may not cover all possible information. If you have questions about this medicine, talk to your doctor, pharmacist, or health care provider.  2018 Elsevier/Gold Standard (2013-05-23 10:38:36)

## 2017-01-24 NOTE — Patient Instructions (Signed)
Implanted Port Home Guide An implanted port is a type of central line that is placed under the skin. Central lines are used to provide IV access when treatment or nutrition needs to be given through a person's veins. Implanted ports are used for long-term IV access. An implanted port may be placed because:  You need IV medicine that would be irritating to the small veins in your hands or arms.  You need long-term IV medicines, such as antibiotics.  You need IV nutrition for a long period.  You need frequent blood draws for lab tests.  You need dialysis.  Implanted ports are usually placed in the chest area, but they can also be placed in the upper arm, the abdomen, or the leg. An implanted port has two main parts:  Reservoir. The reservoir is round and will appear as a small, raised area under your skin. The reservoir is the part where a needle is inserted to give medicines or draw blood.  Catheter. The catheter is a thin, flexible tube that extends from the reservoir. The catheter is placed into a large vein. Medicine that is inserted into the reservoir goes into the catheter and then into the vein.  How will I care for my incision site? Do not get the incision site wet. Bathe or shower as directed by your health care provider. How is my port accessed? Special steps must be taken to access the port:  Before the port is accessed, a numbing cream can be placed on the skin. This helps numb the skin over the port site.  Your health care provider uses a sterile technique to access the port. ? Your health care provider must put on a mask and sterile gloves. ? The skin over your port is cleaned carefully with an antiseptic and allowed to dry. ? The port is gently pinched between sterile gloves, and a needle is inserted into the port.  Only "non-coring" port needles should be used to access the port. Once the port is accessed, a blood return should be checked. This helps ensure that the port  is in the vein and is not clogged.  If your port needs to remain accessed for a constant infusion, a clear (transparent) bandage will be placed over the needle site. The bandage and needle will need to be changed every week, or as directed by your health care provider.  Keep the bandage covering the needle clean and dry. Do not get it wet. Follow your health care provider's instructions on how to take a shower or bath while the port is accessed.  If your port does not need to stay accessed, no bandage is needed over the port.  What is flushing? Flushing helps keep the port from getting clogged. Follow your health care provider's instructions on how and when to flush the port. Ports are usually flushed with saline solution or a medicine called heparin. The need for flushing will depend on how the port is used.  If the port is used for intermittent medicines or blood draws, the port will need to be flushed: ? After medicines have been given. ? After blood has been drawn. ? As part of routine maintenance.  If a constant infusion is running, the port may not need to be flushed.  How long will my port stay implanted? The port can stay in for as long as your health care provider thinks it is needed. When it is time for the port to come out, surgery will be   done to remove it. The procedure is similar to the one performed when the port was put in. When should I seek immediate medical care? When you have an implanted port, you should seek immediate medical care if:  You notice a bad smell coming from the incision site.  You have swelling, redness, or drainage at the incision site.  You have more swelling or pain at the port site or the surrounding area.  You have a fever that is not controlled with medicine.  This information is not intended to replace advice given to you by your health care provider. Make sure you discuss any questions you have with your health care provider. Document  Released: 07/17/2005 Document Revised: 12/23/2015 Document Reviewed: 03/24/2013 Elsevier Interactive Patient Education  2017 [employer]  

## 2017-01-26 ENCOUNTER — Ambulatory Visit (HOSPITAL_COMMUNITY)
Admission: RE | Admit: 2017-01-26 | Discharge: 2017-01-26 | Disposition: A | Discharge status: Home / Self Care | Payer: BLUE CROSS/BLUE SHIELD | Source: Ambulatory Visit | Attending: Hematology and Oncology | Admitting: Hematology and Oncology

## 2017-01-26 DIAGNOSIS — C50212 Malignant neoplasm of upper-inner quadrant of left female breast: Secondary | ICD-10-CM | POA: Diagnosis not present

## 2017-01-26 DIAGNOSIS — Z170 Estrogen receptor positive status [ER+]: Secondary | ICD-10-CM | POA: Diagnosis present

## 2017-01-26 MED ORDER — GADOBENATE DIMEGLUMINE 529 MG/ML IV SOLN
15.0000 mL | Freq: Once | INTRAVENOUS | Status: AC | PRN
  Administered 2017-01-26: 11 mL via INTRAVENOUS

## 2017-01-29 ENCOUNTER — Ambulatory Visit (HOSPITAL_BASED_OUTPATIENT_CLINIC_OR_DEPARTMENT_OTHER): Payer: BLUE CROSS/BLUE SHIELD | Admitting: Hematology and Oncology

## 2017-01-29 ENCOUNTER — Encounter: Payer: Self-pay | Admitting: Hematology and Oncology

## 2017-01-29 DIAGNOSIS — C50212 Malignant neoplasm of upper-inner quadrant of left female breast: Secondary | ICD-10-CM | POA: Diagnosis not present

## 2017-01-29 DIAGNOSIS — Z170 Estrogen receptor positive status [ER+]: Secondary | ICD-10-CM | POA: Diagnosis not present

## 2017-01-29 NOTE — Assessment & Plan Note (Signed)
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 Neoadjuvant chemotherapy with TCH Perjeta 6 cycles followed by Herceptin and Perjetamaintenance for 1 year 2. Followed by breast conserving surgery if possible with sentinel lymph node study 3. Followed by adjuvant radiation therapy if patient had lumpectomy 4. Followed by adjuvant antiestrogen therapy ------------------------------------------------------------------------------ Breast MRI 01/26/2017:  Left breast: Irregular enhancing mass measuring 2.7 cm, smaller than previous, no abnormal lymph nodes; right breast: No mass or enhancement  Radiology review: I discussed the radiology findings with the patient provided her with a copy of this report.  Patient will now proceed to surgery with Dr. Donne Hazel.

## 2017-01-29 NOTE — Progress Notes (Signed)
Patient Care Team: Patient, No Pcp Per as PCP - General (General Practice) Miranda Skates, MD as Consulting Physician (General Surgery) Nicholas Lose, MD as Consulting Physician (Hematology and Oncology) Eppie Gibson, MD as Attending Physician (Radiation Oncology) Gardenia Phlegm, NP as Nurse Practitioner (Hematology and Oncology)  DIAGNOSIS:  Encounter Diagnosis  Name Primary?  . Malignant neoplasm of upper-inner quadrant of left breast in female, estrogen receptor positive ([place])     SUMMARY OF ONCOLOGIC HISTORY:   Malignant neoplasm of upper-inner quadrant of left breast in female, estrogen receptor positive ([place]anda [place])   09/14/2016 Initial Diagnosis    Palpable left breast mass for 2 months, ultrasound at 9:00: 4.7 x 3.6 x 3.3 cm, axilla negative: U/S Bx: Grade 2 IDC ER 95%, PR 50%, HER-2 positive ratio 6.17, Ki-67 20%, T2 N0 stage IIA clinical stage      09/27/2016 Breast MRI    Left breast: 3.5 cm irregular necrotic mass; left axillary lymph node 1.2 cm; right breast 0.6 cm mass at 6:00 position 0.5 cm mass right breast lower inner quadrant       10/11/2016 -  Neo-Adjuvant Chemotherapy    Taxotere, Carboplatin, Herceptin, Perjeta x 6 cycles followed by Herceptin and Perjeta maintenance for one year      01/26/2017 Breast MRI    Left breast: Irregular enhancing mass measuring 2.7 cm, smaller than previous, no abnormal lymph nodes; right breast: No mass or enhancement        CHIEF COMPLIANT: Follow-up after recent breast MRI  INTERVAL HISTORY: Miranda Woods is a 61 year old with above-mentioned history of left breast cancer treated with neoadjuvant chemotherapy with [facility] Perjeta for 6 cycles and is here after undergoing breast MRI. She is here to discuss the results. Overall she done very well from the last cycle of chemotherapy. She has appointments to see surgery as well.  REVIEW OF SYSTEMS:   Constitutional: Denies fevers, chills 2-3 pound weight loss.  Eyes:  Denies blurriness of vision Ears, nose, mouth, throat, and face: Denies mucositis or sore throat Respiratory: Denies cough, dyspnea or wheezes Cardiovascular: Denies palpitation, chest discomfort Gastrointestinal: Intermittent loose stools Skin: Denies abnormal skin rashes Lymphatics: Denies new lymphadenopathy or easy bruising Neurological:Denies numbness, tingling or new weaknesses Behavioral/Psych: Mood is stable, no new changes  Extremities: No lower extremity edema Breast:  denies any pain or lumps or nodules in either breasts All other systems were reviewed with the patient and are negative.  I have reviewed the past medical history, past surgical history, social history and family history with the patient and they are unchanged from previous note.  ALLERGIES:  is allergic to codeine.  MEDICATIONS:  Current Outpatient Prescriptions  Medication Sig Dispense Refill  . clindamycin (CLINDAGEL) 1 % gel Apply to rash BID PRN. (Patient not taking: Reported on 11/28/2016) 30 g 1  . dexamethasone (DECADRON) 4 MG tablet Take 0.5 tablets (2 mg total) by mouth daily. 1/2 tab day before Taxotere. Then again the day after chemo for 1 day (Patient not taking: Reported on 01/08/2017) 10 tablet 0  . diphenhydrAMINE (BENADRYL) 25 MG tablet Take 1 tablet (25 mg total) by mouth every 6 (six) hours as needed for itching or allergies. (Patient not taking: Reported on 11/28/2016) 30 tablet 2  . famotidine (PEPCID) 20 MG tablet Take 1 tablet (20 mg total) by mouth 2 (two) times daily as needed for heartburn or indigestion. (Patient not taking: Reported on 01/08/2017) 30 tablet 2  . lidocaine-prilocaine (EMLA) cream Apply to affected  area once 30 g 3  . LORazepam (ATIVAN) 0.5 MG tablet Take 1-2 tablets (0.5-1 mg total) by mouth every 8 (eight) hours as needed for anxiety or sleep. 60 tablet 0  . ondansetron (ZOFRAN) 8 MG tablet Take 1 tablet (8 mg total) by mouth 2 (two) times daily as needed for refractory nausea  / vomiting. Start on day 3 after chemo. (Patient not taking: Reported on 01/08/2017) 30 tablet 1  . prochlorperazine (COMPAZINE) 10 MG tablet Take 1 tablet (10 mg total) by mouth every 6 (six) hours as needed (Nausea or vomiting). (Patient not taking: Reported on 11/28/2016) 30 tablet 1  . triamcinolone cream (KENALOG) 0.5 % Apply 1 application topically 2 (two) times daily. 30 g 0   No current facility-administered medications for this visit.    Facility-Administered Medications Ordered in Other Visits  Medication Dose Route Frequency Provider Last Rate Last Dose  . sodium chloride flush (NS) 0.9 % injection 10 mL  10 mL Intravenous PRN Nicholas Lose, MD   10 mL at 01/24/17 1116    PHYSICAL EXAMINATION: ECOG PERFORMANCE STATUS: 1 - Symptomatic but completely ambulatory  Vitals:   01/29/17 0805  BP: 123/75  Pulse: 89  Resp: 18  Temp: 98.5 F (36.9 C)   Filed Weights   01/29/17 0805  Weight: 116 lb 11.2 oz (52.9 kg)    GENERAL:alert, no distress and comfortable SKIN: skin color, texture, turgor are normal, no rashes or significant lesions EYES: normal, Conjunctiva are pink and non-injected, sclera clear OROPHARYNX:no exudate, no erythema and lips, buccal mucosa, and tongue normal  NECK: supple, thyroid normal size, non-tender, without nodularity LYMPH:  no palpable lymphadenopathy in the cervical, axillary or inguinal LUNGS: clear to auscultation and percussion with normal breathing effort HEART: regular rate & rhythm and no murmurs and no lower extremity edema ABDOMEN:abdomen soft, non-tender and normal bowel sounds MUSCULOSKELETAL:no cyanosis of digits and no clubbing  NEURO: alert & oriented x 3 with fluent speech, mild peripheral neuropathy EXTREMITIES: No lower extremity edema  LABORATORY DATA:  I have reviewed the data as listed   Chemistry      Component Value Date/Time   NA 140 01/24/2017 1108   K 3.8 01/24/2017 1108   CL 101 11/30/2011 1649   CO2 26 01/24/2017  1108   BUN 13.4 01/24/2017 1108   CREATININE 0.6 01/24/2017 1108      Component Value Date/Time   CALCIUM 9.7 01/24/2017 1108   ALKPHOS 57 01/24/2017 1108   AST 14 01/24/2017 1108   ALT 20 01/24/2017 1108   BILITOT 0.60 01/24/2017 1108       Lab Results  Component Value Date   WBC 4.7 01/24/2017   HGB 11.4 (L) 01/24/2017   HCT 34.6 (L) 01/24/2017   MCV 100.9 01/24/2017   PLT 180 01/24/2017   NEUTROABS 2.9 01/24/2017    ASSESSMENT & PLAN:  Malignant neoplasm of upper-inner quadrant of left breast in female, estrogen receptor positive ([facility]) Palpable left breast mass for 2 months, ultrasound at 9:00: 4.7 x 3.6 x 3.3 cm, axilla negative: U/S Bx: Grade 2 IDC ER 95%, PR 50%, HER-2 positive ratio 6.17, Ki-67 20%,  Right breast biopsy: PASH  MRI breast 09/28/2016: Left breast: 3.5 cm irregular necrotic mass; left axillary lymph node 1.2 cm; right breast 0.6 cm mass at 6:00 position 0.5 cm mass right breast lower inner quadrant  T2 N0 stage IIAclinical stage (could be stage IIB if the lymph node in the left axilla is positive)  Recommendationbased on multidisciplinary tumor board: 1. Neoadjuvant chemotherapy with TCH Perjeta 6 cycles followed by Herceptin and Perjetamaintenance for 1 year 2. Followed by breast conserving surgery if possible with sentinel lymph node study 3. Followed by adjuvant radiation therapy if patient had lumpectomy 4. Followed by adjuvant antiestrogen therapy ------------------------------------------------------------------------------ Breast MRI 01/26/2017:  Left breast: Irregular enhancing mass measuring 2.7 cm, smaller than previous, no abnormal lymph nodes; right breast: No mass or enhancement  Radiology review: I discussed the radiology findings with the patient provided her with a copy of this report.  Patient will now proceed to surgery with Dr. Donne Hazel.  Return to clinic after surgery to discuss the final pathology report. She'll come every  3 weeks for Herceptin Perjeta maintenance.  I spent 25 minutes talking to the patient of which more than half was spent in counseling and coordination of care.  No orders of the defined types were placed in this encounter.  The patient has a good understanding of the overall plan. she agrees with it. she will call with any problems that may develop before the next visit here.   Rulon Eisenmenger, MD 01/29/17

## 2017-02-02 ENCOUNTER — Other Ambulatory Visit: Payer: Self-pay | Admitting: General Surgery

## 2017-02-02 DIAGNOSIS — Z170 Estrogen receptor positive status [ER+]: Principal | ICD-10-CM

## 2017-02-02 DIAGNOSIS — C50212 Malignant neoplasm of upper-inner quadrant of left female breast: Secondary | ICD-10-CM

## 2017-02-06 ENCOUNTER — Other Ambulatory Visit: Payer: Self-pay | Admitting: *Deleted

## 2017-02-06 DIAGNOSIS — Z170 Estrogen receptor positive status [ER+]: Principal | ICD-10-CM

## 2017-02-06 DIAGNOSIS — C50212 Malignant neoplasm of upper-inner quadrant of left female breast: Secondary | ICD-10-CM

## 2017-02-06 MED ORDER — LORAZEPAM 0.5 MG PO TABS: 0.5000 mg | ORAL_TABLET | Freq: Three times a day (TID) | ORAL | 0 refills | Status: DC | PRN

## 2017-02-13 ENCOUNTER — Other Ambulatory Visit: Payer: Self-pay | Admitting: Emergency Medicine

## 2017-02-13 DIAGNOSIS — C50212 Malignant neoplasm of upper-inner quadrant of left female breast: Secondary | ICD-10-CM

## 2017-02-13 DIAGNOSIS — Z170 Estrogen receptor positive status [ER+]: Principal | ICD-10-CM

## 2017-02-14 ENCOUNTER — Telehealth: Payer: Self-pay

## 2017-02-14 ENCOUNTER — Ambulatory Visit (HOSPITAL_BASED_OUTPATIENT_CLINIC_OR_DEPARTMENT_OTHER): Payer: BLUE CROSS/BLUE SHIELD

## 2017-02-14 ENCOUNTER — Other Ambulatory Visit (HOSPITAL_BASED_OUTPATIENT_CLINIC_OR_DEPARTMENT_OTHER): Payer: BLUE CROSS/BLUE SHIELD

## 2017-02-14 ENCOUNTER — Ambulatory Visit (HOSPITAL_BASED_OUTPATIENT_CLINIC_OR_DEPARTMENT_OTHER): Payer: BLUE CROSS/BLUE SHIELD | Admitting: Hematology and Oncology

## 2017-02-14 ENCOUNTER — Encounter: Payer: Self-pay | Admitting: Hematology and Oncology

## 2017-02-14 ENCOUNTER — Ambulatory Visit: Payer: BLUE CROSS/BLUE SHIELD

## 2017-02-14 DIAGNOSIS — Z5112 Encounter for antineoplastic immunotherapy: Secondary | ICD-10-CM

## 2017-02-14 DIAGNOSIS — C50212 Malignant neoplasm of upper-inner quadrant of left female breast: Secondary | ICD-10-CM | POA: Diagnosis not present

## 2017-02-14 DIAGNOSIS — Z170 Estrogen receptor positive status [ER+]: Secondary | ICD-10-CM

## 2017-02-14 DIAGNOSIS — Z95828 Presence of other vascular implants and grafts: Secondary | ICD-10-CM

## 2017-02-14 LAB — COMPREHENSIVE METABOLIC PANEL
ALT: 20 U/L (ref 0–55)
ANION GAP: 8 meq/L (ref 3–11)
AST: 15 U/L (ref 5–34)
Albumin: 3.8 g/dL (ref 3.5–5.0)
Alkaline Phosphatase: 61 U/L (ref 40–150)
BUN: 15.5 mg/dL (ref 7.0–26.0)
CALCIUM: 9.1 mg/dL (ref 8.4–10.4)
CHLORIDE: 106 meq/L (ref 98–109)
CO2: 25 mEq/L (ref 22–29)
Creatinine: 0.7 mg/dL (ref 0.6–1.1)
EGFR: >90 mL/min/{1.73_m2} (ref >90)
Glucose: 96 mg/dl (ref 70–140)
POTASSIUM: 4 meq/L (ref 3.5–5.1)
Sodium: 140 mEq/L (ref 136–145)
Total Bilirubin: 0.32 mg/dL (ref 0.20–1.20)
Total Protein: 6.4 g/dL (ref 6.4–8.3)

## 2017-02-14 LAB — CBC WITH DIFFERENTIAL/PLATELET
BASO%: 1.1 % (ref 0.0–2.0)
BASOS ABS: 0 10*3/uL (ref 0.0–0.1)
EOS%: 0.1 % (ref 0.0–7.0)
Eosinophils Absolute: 0 10*3/uL (ref 0.0–0.5)
HEMATOCRIT: 35.9 % (ref 34.8–46.6)
HGB: 12.1 g/dL (ref 11.6–15.9)
LYMPH#: 0.7 10*3/uL — AB (ref 0.9–3.3)
LYMPH%: 26.2 % (ref 14.0–49.7)
MCH: 34 pg (ref 25.1–34.0)
MCHC: 33.8 g/dL (ref 31.5–36.0)
MCV: 100.5 fL (ref 79.5–101.0)
MONO#: 0.3 10*3/uL (ref 0.1–0.9)
MONO%: 12.9 % (ref 0.0–14.0)
NEUT#: 1.6 10*3/uL (ref 1.5–6.5)
NEUT%: 59.7 % (ref 38.4–76.8)
PLATELETS: 174 10*3/uL (ref 145–400)
RBC: 3.57 10*6/uL — AB (ref 3.70–5.45)
RDW: 13.8 % (ref 11.2–14.5)
WBC: 2.7 10*3/uL — ABNORMAL LOW (ref 3.9–10.3)

## 2017-02-14 MED ORDER — DIPHENHYDRAMINE HCL 25 MG PO CAPS
ORAL_CAPSULE | ORAL | Status: AC
  Filled 2017-02-14: qty 2

## 2017-02-14 MED ORDER — HEPARIN SOD (PORK) LOCK FLUSH 100 UNIT/ML IV SOLN
500.0000 [IU] | Freq: Once | INTRAVENOUS | Status: AC | PRN
  Administered 2017-02-14: 500 [IU]
  Filled 2017-02-14: qty 5

## 2017-02-14 MED ORDER — ACETAMINOPHEN 325 MG PO TABS
ORAL_TABLET | ORAL | Status: AC
  Filled 2017-02-14: qty 2

## 2017-02-14 MED ORDER — DIPHENHYDRAMINE HCL 25 MG PO CAPS
50.0000 mg | ORAL_CAPSULE | Freq: Once | ORAL | Status: AC
  Administered 2017-02-14: 50 mg via ORAL

## 2017-02-14 MED ORDER — SODIUM CHLORIDE 0.9 % IV SOLN
Freq: Once | INTRAVENOUS | Status: AC
  Administered 2017-02-14: 11:00:00 via INTRAVENOUS

## 2017-02-14 MED ORDER — TRASTUZUMAB CHEMO 150 MG IV SOLR
6.0000 mg/kg | Freq: Once | INTRAVENOUS | Status: AC
  Administered 2017-02-14: 357 mg via INTRAVENOUS
  Filled 2017-02-14: qty 17

## 2017-02-14 MED ORDER — ACETAMINOPHEN 325 MG PO TABS
650.0000 mg | ORAL_TABLET | Freq: Once | ORAL | Status: AC
  Administered 2017-02-14: 650 mg via ORAL

## 2017-02-14 MED ORDER — SODIUM CHLORIDE 0.9% FLUSH
10.0000 mL | INTRAVENOUS | Status: AC | PRN
  Administered 2017-02-14 (×2): 10 mL via INTRAVENOUS
  Filled 2017-02-14: qty 10

## 2017-02-14 MED ORDER — METHYLPHENIDATE HCL 5 MG PO TABS: 5.0000 mg | ORAL_TABLET | Freq: Two times a day (BID) | ORAL | 0 refills | Status: DC

## 2017-02-14 MED ORDER — SODIUM CHLORIDE 0.9 % IV SOLN
420.0000 mg | Freq: Once | INTRAVENOUS | Status: AC
  Administered 2017-02-14: 420 mg via INTRAVENOUS
  Filled 2017-02-14: qty 14

## 2017-02-14 NOTE — Telephone Encounter (Signed)
8/8 md appt was cancelled per 7/18 los  Miranda Woods

## 2017-02-14 NOTE — Assessment & Plan Note (Signed)
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 Neoadjuvant chemotherapy with TCH Perjeta 6 cycles followed by Herceptin and Perjetamaintenance for 1 year 2. Followed by breast conserving surgery if possible with sentinel lymph node study 3. Followed by adjuvant radiation therapy if patient had lumpectomy 4. Followed by adjuvant antiestrogen therapy ------------------------------------------------------------------------------ Breast MRI 01/26/2017:  Left breast: Irregular enhancing mass measuring 2.7 cm, smaller than previous, no abnormal lymph nodes; right breast: No mass or enhancement  Patient plans to do her breast conserving surgery in August. In the meantime we will continue Herceptin and Perjeta maintenance.

## 2017-02-14 NOTE — Patient Instructions (Signed)
[facility] Discharge Instructions for Patients Receiving Chemotherapy  Today you received the following chemotherapy agents Herceptin/Perjeta  To help prevent nausea and vomiting after your treatment, we encourage you to take your nausea medication    If you develop nausea and vomiting that is not controlled by your nausea medication, call the clinic.   BELOW ARE SYMPTOMS THAT SHOULD BE REPORTED IMMEDIATELY:  *FEVER GREATER THAN 100.5 F  *CHILLS WITH OR WITHOUT FEVER  NAUSEA AND VOMITING THAT IS NOT CONTROLLED WITH YOUR NAUSEA MEDICATION  *UNUSUAL SHORTNESS OF BREATH  *UNUSUAL BRUISING OR BLEEDING  TENDERNESS IN MOUTH AND THROAT WITH OR WITHOUT PRESENCE OF ULCERS  *URINARY PROBLEMS  *BOWEL PROBLEMS  UNUSUAL RASH Items with * indicate a potential emergency and should be followed up as soon as possible.  Feel free to call the clinic you have any questions or concerns. The clinic phone number is [phone].  Please show the CHEMO ALERT CARD at check-in to the Emergency Department and triage nurse.   

## 2017-02-14 NOTE — Progress Notes (Signed)
Patient Care Team: Patient, No Pcp Per as PCP - General (General Practice) [name], MD as Consulting Physician (General Surgery) Nicholas Lose, MD as Consulting Physician (Hematology and Oncology) Eppie Gibson, MD as Attending Physician (Radiation Oncology) Gardenia Phlegm, NP as Nurse Practitioner (Hematology and Oncology)  DIAGNOSIS:  Encounter Diagnosis  Name Primary?  . 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 6.17, Ki-67 20%, T2 N0 stage IIA clinical stage      09/27/2016 Breast MRI    Left breast: 3.5 cm irregular necrotic mass; left axillary lymph node 1.2 cm; right breast 0.6 cm mass at 6:00 position 0.5 cm mass right breast lower inner quadrant       10/11/2016 -  Neo-Adjuvant Chemotherapy    Taxotere, Carboplatin, Herceptin, Perjeta x 6 cycles followed by Herceptin and Perjeta maintenance for one year      01/26/2017 Breast MRI    Left breast: Irregular enhancing mass measuring 2.7 cm, smaller than previous, no abnormal lymph nodes; right breast: No mass or enhancement        CHIEF COMPLIANT: Herceptin and Perjeta maintenance  INTERVAL HISTORY: Miranda Woods is a 61 year old with above-mentioned history left breast cancer who completed neoadjuvant chemotherapy and is currently on Herceptin and Perjeta maintenance. She has noticed severe fatigue after the last chemotherapy but is starting to recovery now. Her surgery has been scheduled for August 6. 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 This has given her a lot of inspiration that she is more than the breast cancer and will recover from this and do the work that she loves.  REVIEW OF SYSTEMS:   Constitutional: Denies fevers, chills or abnormal weight loss Eyes: Denies blurriness of vision Ears, nose, mouth, throat, and face: Denies mucositis or sore throat Respiratory: Denies cough, dyspnea or wheezes Cardiovascular: Denies palpitation, chest discomfort Gastrointestinal:  Denies nausea, heartburn or change in bowel habits Skin: Denies abnormal skin rashes Lymphatics: Denies new lymphadenopathy or easy bruising Neurological:Denies numbness, tingling or new weaknesses Behavioral/Psych: Mood is stable, no new changes  Extremities: No lower extremity edema Breast: Mild discomfort in the breast All other systems were reviewed with the patient and are negative.  I have reviewed the past medical history, past surgical history, social history and family history with the patient and they are unchanged from previous note.  ALLERGIES:  is allergic to codeine.  MEDICATIONS:  Current Outpatient Prescriptions  Medication Sig Dispense Refill  . LORazepam (ATIVAN) 0.5 MG tablet Take 1-2 tablets (0.5-1 mg total) by mouth every 8 (eight) hours as needed for anxiety or sleep. 60 tablet 0  . methylphenidate (RITALIN) 5 MG tablet Take 1 tablet (5 mg total) by mouth 2 (two) times daily.  0  . triamcinolone cream (KENALOG) 0.5 % Apply 1 application topically 2 (two) times daily. 30 g 0   No current facility-administered medications for this visit.    Facility-Administered Medications Ordered in Other Visits  Medication Dose Route  Frequency Provider Last Rate Last Dose  . sodium chloride flush (NS) 0.9 % injection 10 mL  10 mL Intravenous PRN Nicholas Lose, MD   10 mL at 01/24/17 1116  . sodium chloride flush (NS) 0.9 % injection 10 mL  10 mL Intravenous PRN Nicholas Lose, MD   10 mL at 02/14/17 0931     PHYSICAL EXAMINATION: ECOG PERFORMANCE STATUS: 1 - Symptomatic but completely ambulatory  Vitals:   02/14/17 0947  BP: 119/65  Pulse: 72  Resp: 18  Temp: 98 F (36.7 C)   Filed Weights   02/14/17 0947  Weight: 119 lb 11.2 oz (54.3 kg)    GENERAL:alert, no distress and comfortable SKIN: skin color, texture, turgor are normal, no rashes or significant lesions EYES: normal, Conjunctiva are pink and non-injected, sclera clear OROPHARYNX:no exudate, no erythema and lips, buccal mucosa, and tongue normal  NECK: supple, thyroid normal size, non-tender, without nodularity LYMPH:  no palpable lymphadenopathy in the cervical, axillary or inguinal LUNGS: clear to auscultation and percussion with normal breathing effort HEART: regular rate & rhythm and no murmurs and no lower extremity edema ABDOMEN:abdomen soft, non-tender and normal bowel sounds MUSCULOSKELETAL:no cyanosis of digits and no clubbing  NEURO: alert & oriented x 3 with fluent speech, no focal motor/sensory deficits EXTREMITIES: No lower extremity edema  LABORATORY DATA:  I have reviewed the data as listed   Chemistry      Component Value Date/Time   NA 140 01/24/2017 1108   K 3.8 01/24/2017 1108   CL 101 11/30/2011 1649   CO2 26 01/24/2017 1108   BUN 13.4 01/24/2017 1108   CREATININE 0.6 01/24/2017 1108      Component Value Date/Time   CALCIUM 9.7 01/24/2017 1108   ALKPHOS 57 01/24/2017 1108   AST 14 01/24/2017 1108   ALT 20 01/24/2017 1108   BILITOT 0.60 01/24/2017 1108       Lab Results  Component Value Date   WBC 2.7 (L) 02/14/2017   HGB 12.1 02/14/2017   HCT 35.9 02/14/2017   MCV 100.5 02/14/2017   PLT 174 02/14/2017   NEUTROABS 1.6 02/14/2017    ASSESSMENT & PLAN:  Malignant neoplasm of upper-inner quadrant of left breast in female, estrogen receptor positive ([facility]) Palpable left breast mass for 2 months, ultrasound at 9:00: 4.7 x 3.6 x 3.3 cm, axilla negative: U/S Bx: Grade 2 IDC ER 95%, PR  50%, HER-2 positive ratio 6.17, Ki-67 20%,  Right breast biopsy: PASH  MRI breast 09/28/2016: Left breast: 3.5 cm irregular necrotic mass; left axillary lymph node 1.2 cm; right breast 0.6 cm mass at 6:00 position 0.5 cm mass right breast lower inner quadrant  T2 N0 stage IIAclinical stage (could be stage IIB if the lymph node in the left axilla is positive)  Recommendationbased on multidisciplinary tumor board: 1. Neoadjuvant chemotherapy with TCH Perjeta 6 cycles followed by Herceptin and Perjetamaintenance for 1 year 2. Followed by breast conserving surgery if possible with sentinel lymph node study 3. Followed by adjuvant radiation therapy if patient had lumpectomy 4. Followed by adjuvant antiestrogen therapy ------------------------------------------------------------------------------ Breast MRI 01/26/2017:  Left breast: Irregular enhancing mass measuring 2.7 cm, smaller than previous, no abnormal lymph nodes; right breast: No mass or enhancement  Patient plans to do her breast conserving surgery in August. In the meantime we will continue Herceptin and Perjeta maintenance. Monitoring closely for Herceptin and Perjeta toxicities. I will cancel appointment on 03/07/2017 for her to see me. She will receive Herceptin and Perjeta  on that day.  I spent 25 minutes talking to the patient of which more than half was spent in counseling and coordination of care.  No orders of the defined types were placed in this encounter.  The patient has a good understanding of the overall plan. she agrees with it. she will call with any problems that may develop before the next visit here.   Rulon Eisenmenger, MD 02/14/17

## 2017-02-14 NOTE — Patient Instructions (Signed)
Implanted Port Home Guide An implanted port is a type of central line that is placed under the skin. Central lines are used to provide IV access when treatment or nutrition needs to be given through a person's veins. Implanted ports are used for long-term IV access. An implanted port may be placed because:  You need IV medicine that would be irritating to the small veins in your hands or arms.  You need long-term IV medicines, such as antibiotics.  You need IV nutrition for a long period.  You need frequent blood draws for lab tests.  You need dialysis.  Implanted ports are usually placed in the chest area, but they can also be placed in the upper arm, the abdomen, or the leg. An implanted port has two main parts:  Reservoir. The reservoir is round and will appear as a small, raised area under your skin. The reservoir is the part where a needle is inserted to give medicines or draw blood.  Catheter. The catheter is a thin, flexible tube that extends from the reservoir. The catheter is placed into a large vein. Medicine that is inserted into the reservoir goes into the catheter and then into the vein.  How will I care for my incision site? Do not get the incision site wet. Bathe or shower as directed by your health care provider. How is my port accessed? Special steps must be taken to access the port:  Before the port is accessed, a numbing cream can be placed on the skin. This helps numb the skin over the port site.  Your health care provider uses a sterile technique to access the port. ? Your health care provider must put on a mask and sterile gloves. ? The skin over your port is cleaned carefully with an antiseptic and allowed to dry. ? The port is gently pinched between sterile gloves, and a needle is inserted into the port.  Only "non-coring" port needles should be used to access the port. Once the port is accessed, a blood return should be checked. This helps ensure that the port  is in the vein and is not clogged.  If your port needs to remain accessed for a constant infusion, a clear (transparent) bandage will be placed over the needle site. The bandage and needle will need to be changed every week, or as directed by your health care provider.  Keep the bandage covering the needle clean and dry. Do not get it wet. Follow your health care provider's instructions on how to take a shower or bath while the port is accessed.  If your port does not need to stay accessed, no bandage is needed over the port.  What is flushing? Flushing helps keep the port from getting clogged. Follow your health care provider's instructions on how and when to flush the port. Ports are usually flushed with saline solution or a medicine called heparin. The need for flushing will depend on how the port is used.  If the port is used for intermittent medicines or blood draws, the port will need to be flushed: ? After medicines have been given. ? After blood has been drawn. ? As part of routine maintenance.  If a constant infusion is running, the port may not need to be flushed.  How long will my port stay implanted? The port can stay in for as long as your health care provider thinks it is needed. When it is time for the port to come out, surgery will be   done to remove it. The procedure is similar to the one performed when the port was put in. When should I seek immediate medical care? When you have an implanted port, you should seek immediate medical care if:  You notice a bad smell coming from the incision site.  You have swelling, redness, or drainage at the incision site.  You have more swelling or pain at the port site or the surrounding area.  You have a fever that is not controlled with medicine.  This information is not intended to replace advice given to you by your health care provider. Make sure you discuss any questions you have with your health care provider. Document  Released: 07/17/2005 Document Revised: 12/23/2015 Document Reviewed: 03/24/2013 Elsevier Interactive Patient Education  2017 [employer]  

## 2017-02-22 ENCOUNTER — Other Ambulatory Visit: Payer: Self-pay | Admitting: *Deleted

## 2017-02-28 ENCOUNTER — Ambulatory Visit
Admission: RE | Admit: 2017-02-28 | Discharge: 2017-02-28 | Disposition: A | Discharge status: Home / Self Care | Payer: BLUE CROSS/BLUE SHIELD | Source: Ambulatory Visit | Attending: General Surgery | Admitting: General Surgery

## 2017-02-28 ENCOUNTER — Encounter (HOSPITAL_BASED_OUTPATIENT_CLINIC_OR_DEPARTMENT_OTHER): Payer: Self-pay | Admitting: *Deleted

## 2017-02-28 DIAGNOSIS — Z170 Estrogen receptor positive status [ER+]: Principal | ICD-10-CM

## 2017-02-28 DIAGNOSIS — C50212 Malignant neoplasm of upper-inner quadrant of left female breast: Secondary | ICD-10-CM

## 2017-02-28 NOTE — Progress Notes (Signed)
Pt given Boost drink and instructed to drink by 0515 day of surgery with teach back method.

## 2017-03-05 ENCOUNTER — Encounter (HOSPITAL_BASED_OUTPATIENT_CLINIC_OR_DEPARTMENT_OTHER): Payer: Self-pay | Admitting: Certified Registered"

## 2017-03-05 ENCOUNTER — Ambulatory Visit (HOSPITAL_BASED_OUTPATIENT_CLINIC_OR_DEPARTMENT_OTHER): Payer: BLUE CROSS/BLUE SHIELD | Admitting: Certified Registered"

## 2017-03-05 ENCOUNTER — Encounter (HOSPITAL_BASED_OUTPATIENT_CLINIC_OR_DEPARTMENT_OTHER)
Admission: RE | Disposition: A | Discharge status: Home / Self Care | Payer: Self-pay | Source: Ambulatory Visit | Attending: General Surgery

## 2017-03-05 ENCOUNTER — Ambulatory Visit (HOSPITAL_BASED_OUTPATIENT_CLINIC_OR_DEPARTMENT_OTHER)
Admission: RE | Admit: 2017-03-05 | Discharge: 2017-03-05 | Disposition: A | Discharge status: Home / Self Care | Payer: BLUE CROSS/BLUE SHIELD | Source: Ambulatory Visit | Attending: General Surgery | Admitting: General Surgery

## 2017-03-05 ENCOUNTER — Ambulatory Visit
Admission: RE | Admit: 2017-03-05 | Discharge: 2017-03-05 | Disposition: A | Discharge status: Home / Self Care | Payer: BLUE CROSS/BLUE SHIELD | Source: Ambulatory Visit | Attending: General Surgery | Admitting: General Surgery

## 2017-03-05 ENCOUNTER — Encounter (HOSPITAL_COMMUNITY)
Admission: RE | Admit: 2017-03-05 | Discharge: 2017-03-05 | Disposition: A | Discharge status: Home / Self Care | Payer: BLUE CROSS/BLUE SHIELD | Source: Ambulatory Visit | Attending: General Surgery | Admitting: General Surgery

## 2017-03-05 DIAGNOSIS — C50212 Malignant neoplasm of upper-inner quadrant of left female breast: Secondary | ICD-10-CM

## 2017-03-05 DIAGNOSIS — Z170 Estrogen receptor positive status [ER+]: Secondary | ICD-10-CM | POA: Insufficient documentation

## 2017-03-05 DIAGNOSIS — C50112 Malignant neoplasm of central portion of left female breast: Secondary | ICD-10-CM | POA: Insufficient documentation

## 2017-03-05 DIAGNOSIS — Z803 Family history of malignant neoplasm of breast: Secondary | ICD-10-CM | POA: Insufficient documentation

## 2017-03-05 DIAGNOSIS — Z87891 Personal history of nicotine dependence: Secondary | ICD-10-CM | POA: Insufficient documentation

## 2017-03-05 DIAGNOSIS — Z79899 Other long term (current) drug therapy: Secondary | ICD-10-CM | POA: Diagnosis not present

## 2017-03-05 HISTORY — PX: BREAST LUMPECTOMY: SHX2

## 2017-03-05 HISTORY — DX: Malignant (primary) neoplasm, unspecified: C80.1

## 2017-03-05 HISTORY — PX: BREAST LUMPECTOMY WITH RADIOACTIVE SEED AND SENTINEL LYMPH NODE BIOPSY: SHX6550

## 2017-03-05 SURGERY — BREAST LUMPECTOMY WITH RADIOACTIVE SEED AND SENTINEL LYMPH NODE BIOPSY
Anesthesia: General | Site: Breast | Laterality: Left

## 2017-03-05 MED ORDER — ACETAMINOPHEN 500 MG PO TABS
ORAL_TABLET | ORAL | Status: AC
  Filled 2017-03-05: qty 2

## 2017-03-05 MED ORDER — FENTANYL CITRATE (PF) 100 MCG/2ML IJ SOLN
25.0000 ug | INTRAMUSCULAR | Status: DC | PRN
  Administered 2017-03-05 (×3): 25 ug via INTRAVENOUS

## 2017-03-05 MED ORDER — METOCLOPRAMIDE HCL 5 MG/ML IJ SOLN: 10.0000 mg | Freq: Once | INTRAMUSCULAR | Status: DC | PRN

## 2017-03-05 MED ORDER — LIDOCAINE HCL (CARDIAC) 20 MG/ML IV SOLN
INTRAVENOUS | Status: DC | PRN
  Administered 2017-03-05: 30 mg via INTRAVENOUS

## 2017-03-05 MED ORDER — FENTANYL CITRATE (PF) 100 MCG/2ML IJ SOLN
INTRAMUSCULAR | Status: AC
  Filled 2017-03-05: qty 2

## 2017-03-05 MED ORDER — PROPOFOL 10 MG/ML IV BOLUS
INTRAVENOUS | Status: DC | PRN
  Administered 2017-03-05: 150 mg via INTRAVENOUS

## 2017-03-05 MED ORDER — CEFAZOLIN SODIUM-DEXTROSE 2-4 GM/100ML-% IV SOLN
2.0000 g | INTRAVENOUS | Status: AC
  Administered 2017-03-05: 2 g via INTRAVENOUS

## 2017-03-05 MED ORDER — DEXAMETHASONE SODIUM PHOSPHATE 4 MG/ML IJ SOLN
INTRAMUSCULAR | Status: DC | PRN
  Administered 2017-03-05: 10 mg via INTRAVENOUS

## 2017-03-05 MED ORDER — SODIUM CHLORIDE 0.9 % IJ SOLN
INTRAMUSCULAR | Status: AC
  Filled 2017-03-05: qty 10

## 2017-03-05 MED ORDER — CEFAZOLIN SODIUM-DEXTROSE 2-4 GM/100ML-% IV SOLN
INTRAVENOUS | Status: AC
  Filled 2017-03-05: qty 100

## 2017-03-05 MED ORDER — METHYLENE BLUE 0.5 % INJ SOLN
INTRAVENOUS | Status: DC | PRN
  Administered 2017-03-05: 4 mL

## 2017-03-05 MED ORDER — BUPIVACAINE HCL (PF) 0.25 % IJ SOLN
INTRAMUSCULAR | Status: DC | PRN
  Administered 2017-03-05: 8 mL

## 2017-03-05 MED ORDER — OXYCODONE HCL 5 MG PO TABS
5.0000 mg | ORAL_TABLET | ORAL | Status: DC | PRN
  Administered 2017-03-05: 5 mg via ORAL

## 2017-03-05 MED ORDER — ONDANSETRON HCL 4 MG/2ML IJ SOLN
INTRAMUSCULAR | Status: DC | PRN
  Administered 2017-03-05: 4 mg via INTRAVENOUS

## 2017-03-05 MED ORDER — CELECOXIB 400 MG PO CAPS
400.0000 mg | ORAL_CAPSULE | ORAL | Status: AC
  Administered 2017-03-05: 400 mg via ORAL

## 2017-03-05 MED ORDER — EPHEDRINE SULFATE 50 MG/ML IJ SOLN
INTRAMUSCULAR | Status: DC | PRN
  Administered 2017-03-05: 10 mg via INTRAVENOUS

## 2017-03-05 MED ORDER — ROPIVACAINE HCL 5 MG/ML IJ SOLN
INTRAMUSCULAR | Status: DC | PRN
  Administered 2017-03-05: 30 mL via EPIDURAL

## 2017-03-05 MED ORDER — BUPIVACAINE HCL (PF) 0.25 % IJ SOLN
INTRAMUSCULAR | Status: AC
  Filled 2017-03-05: qty 30

## 2017-03-05 MED ORDER — METHYLENE BLUE 0.5 % INJ SOLN
INTRAVENOUS | Status: AC
  Filled 2017-03-05: qty 10

## 2017-03-05 MED ORDER — OXYCODONE-ACETAMINOPHEN 10-325 MG PO TABS: 1.0000 | ORAL_TABLET | Freq: Four times a day (QID) | ORAL | 0 refills | Status: DC | PRN

## 2017-03-05 MED ORDER — MEPERIDINE HCL 25 MG/ML IJ SOLN: 6.2500 mg | INTRAMUSCULAR | Status: DC | PRN

## 2017-03-05 MED ORDER — ACETAMINOPHEN 500 MG PO TABS
1000.0000 mg | ORAL_TABLET | ORAL | Status: AC
  Administered 2017-03-05: 1000 mg via ORAL

## 2017-03-05 MED ORDER — MIDAZOLAM HCL 2 MG/2ML IJ SOLN
INTRAMUSCULAR | Status: AC
  Filled 2017-03-05: qty 2

## 2017-03-05 MED ORDER — FENTANYL CITRATE (PF) 100 MCG/2ML IJ SOLN
50.0000 ug | INTRAMUSCULAR | Status: DC | PRN
  Administered 2017-03-05: 100 ug via INTRAVENOUS
  Administered 2017-03-05: 50 ug via INTRAVENOUS

## 2017-03-05 MED ORDER — LACTATED RINGERS IV SOLN
INTRAVENOUS | Status: DC
  Administered 2017-03-05 (×2): via INTRAVENOUS

## 2017-03-05 MED ORDER — SCOPOLAMINE 1 MG/3DAYS TD PT72: 1.0000 | MEDICATED_PATCH | Freq: Once | TRANSDERMAL | Status: DC | PRN

## 2017-03-05 MED ORDER — OXYCODONE HCL 5 MG PO TABS
ORAL_TABLET | ORAL | Status: AC
  Filled 2017-03-05: qty 1

## 2017-03-05 MED ORDER — TECHNETIUM TC 99M SULFUR COLLOID FILTERED
1.0000 | Freq: Once | INTRAVENOUS | Status: AC | PRN
Start: 2017-03-05 — End: 2017-03-05
  Administered 2017-03-05: 1 via INTRADERMAL

## 2017-03-05 MED ORDER — GABAPENTIN 300 MG PO CAPS
300.0000 mg | ORAL_CAPSULE | ORAL | Status: AC
  Administered 2017-03-05: 300 mg via ORAL

## 2017-03-05 MED ORDER — LACTATED RINGERS IV SOLN: INTRAVENOUS | Status: DC

## 2017-03-05 MED ORDER — CELECOXIB 200 MG PO CAPS
ORAL_CAPSULE | ORAL | Status: AC
  Filled 2017-03-05: qty 2

## 2017-03-05 MED ORDER — MIDAZOLAM HCL 2 MG/2ML IJ SOLN
1.0000 mg | INTRAMUSCULAR | Status: DC | PRN
  Administered 2017-03-05: 1 mg via INTRAVENOUS

## 2017-03-05 MED ORDER — GABAPENTIN 300 MG PO CAPS
ORAL_CAPSULE | ORAL | Status: AC
  Filled 2017-03-05: qty 1

## 2017-03-05 SURGICAL SUPPLY — 59 items
APPLIER CLIP 9.375 MED OPEN (MISCELLANEOUS)
BINDER BREAST LRG (GAUZE/BANDAGES/DRESSINGS) IMPLANT
BINDER BREAST MEDIUM (GAUZE/BANDAGES/DRESSINGS) ×2 IMPLANT
BINDER BREAST XLRG (GAUZE/BANDAGES/DRESSINGS) IMPLANT
BINDER BREAST XXLRG (GAUZE/BANDAGES/DRESSINGS) IMPLANT
BLADE SURG 15 STRL LF DISP TIS (BLADE) ×1 IMPLANT
BLADE SURG 15 STRL SS (BLADE) ×1
CANISTER SUC SOCK COL 7IN (MISCELLANEOUS) IMPLANT
CANISTER SUCT 1200ML W/VALVE (MISCELLANEOUS) ×2 IMPLANT
CHLORAPREP W/TINT 26ML (MISCELLANEOUS) ×2 IMPLANT
CLIP APPLIE 9.375 MED OPEN (MISCELLANEOUS) IMPLANT
CLIP VESOCCLUDE SM WIDE 6/CT (CLIP) ×2 IMPLANT
COVER BACK TABLE 60X90IN (DRAPES) ×2 IMPLANT
COVER MAYO STAND STRL (DRAPES) ×2 IMPLANT
COVER PROBE W GEL 5X96 (DRAPES) ×2 IMPLANT
DECANTER SPIKE VIAL GLASS SM (MISCELLANEOUS) IMPLANT
DERMABOND ADVANCED (GAUZE/BANDAGES/DRESSINGS) ×1
DERMABOND ADVANCED .7 DNX12 (GAUZE/BANDAGES/DRESSINGS) ×1 IMPLANT
DEVICE DUBIN W/COMP PLATE 8390 (MISCELLANEOUS) ×2 IMPLANT
DRAPE LAPAROSCOPIC ABDOMINAL (DRAPES) ×2 IMPLANT
DRAPE UTILITY XL STRL (DRAPES) ×2 IMPLANT
ELECT COATED BLADE 2.86 ST (ELECTRODE) ×2 IMPLANT
ELECT REM PT RETURN 9FT ADLT (ELECTROSURGICAL) ×2
ELECTRODE REM PT RTRN 9FT ADLT (ELECTROSURGICAL) ×1 IMPLANT
GLOVE BIO SURGEON STRL SZ7 (GLOVE) ×4 IMPLANT
GLOVE BIOGEL PI IND STRL 7.0 (GLOVE) ×1 IMPLANT
GLOVE BIOGEL PI IND STRL 7.5 (GLOVE) ×2 IMPLANT
GLOVE BIOGEL PI INDICATOR 7.0 (GLOVE) ×1
GLOVE BIOGEL PI INDICATOR 7.5 (GLOVE) ×2
GLOVE SURG SS PI 6.5 STRL IVOR (GLOVE) ×2 IMPLANT
GOWN STRL REUS W/ TWL LRG LVL3 (GOWN DISPOSABLE) ×2 IMPLANT
GOWN STRL REUS W/TWL LRG LVL3 (GOWN DISPOSABLE) ×2
HEMOSTAT ARISTA ABSORB 3G PWDR (MISCELLANEOUS) IMPLANT
ILLUMINATOR WAVEGUIDE N/F (MISCELLANEOUS) IMPLANT
KIT MARKER MARGIN INK (KITS) ×2 IMPLANT
LIGHT WAVEGUIDE WIDE FLAT (MISCELLANEOUS) IMPLANT
NDL SAFETY ECLIPSE 18X1.5 (NEEDLE) IMPLANT
NEEDLE HYPO 18GX1.5 SHARP (NEEDLE)
NEEDLE HYPO 25X1 1.5 SAFETY (NEEDLE) ×2 IMPLANT
NS IRRIG 1000ML POUR BTL (IV SOLUTION) IMPLANT
PACK BASIN DAY SURGERY FS (CUSTOM PROCEDURE TRAY) ×2 IMPLANT
PENCIL BUTTON HOLSTER BLD 10FT (ELECTRODE) ×2 IMPLANT
SLEEVE SCD COMPRESS KNEE MED (MISCELLANEOUS) ×2 IMPLANT
SPONGE LAP 4X18 X RAY DECT (DISPOSABLE) ×2 IMPLANT
STRIP CLOSURE SKIN 1/2X4 (GAUZE/BANDAGES/DRESSINGS) ×2 IMPLANT
SUT ETHILON 2 0 FS 18 (SUTURE) IMPLANT
SUT MNCRL AB 4-0 PS2 18 (SUTURE) ×2 IMPLANT
SUT MON AB 5-0 PS2 18 (SUTURE) ×2 IMPLANT
SUT SILK 2 0 SH (SUTURE) IMPLANT
SUT VIC AB 2-0 SH 27 (SUTURE) ×2
SUT VIC AB 2-0 SH 27XBRD (SUTURE) ×2 IMPLANT
SUT VIC AB 3-0 SH 27 (SUTURE) ×1
SUT VIC AB 3-0 SH 27X BRD (SUTURE) ×1 IMPLANT
SUT VIC AB 5-0 PS2 18 (SUTURE) ×2 IMPLANT
SYR CONTROL 10ML LL (SYRINGE) ×4 IMPLANT
TOWEL OR 17X24 6PK STRL BLUE (TOWEL DISPOSABLE) ×2 IMPLANT
TOWEL OR NON WOVEN STRL DISP B (DISPOSABLE) ×2 IMPLANT
TUBE CONNECTING 20X1/4 (TUBING) ×2 IMPLANT
YANKAUER SUCT BULB TIP NO VENT (SUCTIONS) ×2 IMPLANT

## 2017-03-05 NOTE — Transfer of Care (Signed)
Immediate Anesthesia Transfer of Care Note  Patient: Miranda Woods  Procedure(s) Performed: Procedure(s): LEFT BREAST LUMPECTOMY WITH RADIOACTIVE SEED AND SENTINEL LYMPH NODE BIOPSY AND BLUE DYE INJECTION (Left)  Patient Location: PACU  Anesthesia Type:GA combined with regional for post-op pain  Level of Consciousness: awake and patient cooperative  Airway & Oxygen Therapy: Patient Spontanous Breathing and Patient connected to face mask oxygen  Post-op Assessment: Report given to RN and Post -op Vital signs reviewed and stable  Post vital signs: Reviewed and stable  Last Vitals:  Vitals:   03/05/17 0814  BP: 107/71  Pulse: (!) 56  Resp: 18  Temp: (!) 36.4 C    Last Pain:  Vitals:   03/05/17 0814  TempSrc: Oral  PainSc: 0-No pain         Complications: No apparent anesthesia complications

## 2017-03-05 NOTE — Anesthesia Procedure Notes (Addendum)
Anesthesia Regional Block: Pectoralis block   Pre-Anesthetic Checklist: ,, timeout performed, Correct Patient, Correct Site, Correct Laterality, Correct Procedure, Correct Position, site marked, Risks and benefits discussed,  Surgical consent,  Pre-op evaluation,  At surgeon's request and post-op pain management  Laterality: Left  Prep: Maximum Sterile Barrier Precautions used, chloraprep       Needles:  Injection technique: Single-shot  Needle Type: Echogenic Stimulator Needle     Needle Length: 10cm      Additional Needles:   Procedures: ultrasound guided,,,,,,,,  Narrative:  Start time: 03/05/2017 8:35 AM End time: 03/05/2017 8:45 AM Injection made incrementally with aspirations every 5 mL.  Performed by: Personally  Anesthesiologist: Montez Hageman  Additional Notes: Risks, benefits and alternative to block explained extensively.  Patient tolerated procedure well, without complications.

## 2017-03-05 NOTE — Op Note (Signed)
Preoperative diagnosis: stage II left  breast cancer s/p primary  Postoperative diagnosis: same as above Procedure: Left breast seed guided lumpectomy, Left deep axillary sentinel node biopsy, injection blue dye for sentinel node identification Surgeon: Dr Serita Grammes EBL: minimal Anes: general  Specimens  1. Left breast tissue marked with paint 2. Left axillary sentinel nodes with highest count 7425 Complications none Drains none Sponge count correct Dispo to pacu stable  Indications: This is a 25 yof who had completed neoadjuvant therapy. She has positive mri response to treatment and larger area of calcifications.  We discussed options and have elected to attempt lumpectomy with sn biopsy.  A seed was placed prior to beginning and I had these mm in the OR  Procedure: After informed consent was obtained the patient was taken to the operating room. She first was given technetium in standard periareolar fashion.  She had a pectoral block. She was given antibiotics. Sequential compression devices were on her legs. She was then placed under general anesthesia with an LMA. Then she was prepped and draped in the standard sterile surgical fashion. Surgical timeout was then performed.  Ifirst injected blue dye/saline mixture in a periareolar fashion and massaged this for later sentinel node identification.  I then located the seed in the central left breast. I infiltrated marcaine in the skin and made a periareolar incision in order to later hide the scar. I then used the neoprobe to remove the seed and the surrounding tissue with attempt to remove where all the calcifications were as well.  The posterior margin is the muscle now.  I marked this with paint. MM confirmed removal of seed, clip and what appears to be the calcifications.  I placed clips in the cavity.  This was confirmed by radiology.  I then obtained hemostasis.  I closed the breast tissue with 2-0 vicryl, the dermis with 3-0  vicryl and the skin with 5-0 monocryl . I placed glue and steristrips.  I then located the sentinel node .  I infitrated marcaine in the skin. I made a curvilinear incision below the hairline.  I dissected through the axillary fascia. There was a blue node that was also radioactive with count above. There were a couple other radioactive sentinel nodes that were also removed. None of these were enlarged.  The background radioactivity was zero.  I then obtained hemostasis.  I closed the fascia with 2-0 vicryl. The skin was closed with 3-0 vicryl and 4-0 monocryl. Glue and steristrips were applied. She was extubated and transferred to PACU.

## 2017-03-05 NOTE — Anesthesia Preprocedure Evaluation (Signed)
Anesthesia Evaluation  Patient identified by MRN, date of birth, ID band Patient awake    Reviewed: Allergy & Precautions, NPO status , Patient's Chart, lab work & pertinent test results  Airway Mallampati: II  TM Distance: >3 FB Neck ROM: Full    Dental no notable dental hx.    Pulmonary neg pulmonary ROS, former smoker,    Pulmonary exam normal breath sounds clear to auscultation       Cardiovascular negative cardio ROS Normal cardiovascular exam Rhythm:Regular Rate:Normal     Neuro/Psych negative neurological ROS  negative psych ROS   GI/Hepatic negative GI ROS, Neg liver ROS,   Endo/Other  negative endocrine ROS  Renal/GU negative Renal ROS  negative genitourinary   Musculoskeletal negative musculoskeletal ROS (+)   Abdominal   Peds negative pediatric ROS (+)  Hematology negative hematology ROS (+)   Anesthesia Other Findings   Reproductive/Obstetrics negative OB ROS                             Anesthesia Physical Anesthesia Plan  ASA: II  Anesthesia Plan: General   Post-op Pain Management: GA combined w/ Regional for post-op pain   Induction: Intravenous  PONV Risk Score and Plan: 3 and Ondansetron, Dexamethasone and Midazolam  Airway Management Planned: LMA  Additional Equipment:   Intra-op Plan:   Post-operative Plan: Extubation in OR  Informed Consent: I have reviewed the patients History and Physical, chart, labs and discussed the procedure including the risks, benefits and alternatives for the proposed anesthesia with the patient or authorized representative who has indicated his/her understanding and acceptance.   Dental advisory given  Plan Discussed with: CRNA  Anesthesia Plan Comments: (PEC block)        Anesthesia Quick Evaluation

## 2017-03-05 NOTE — Anesthesia Procedure Notes (Signed)
Procedure Name: LMA Insertion Date/Time: 03/05/2017 9:20 AM Performed by: Donique Hammonds D Pre-anesthesia Checklist: Patient identified, Emergency Drugs available, Suction available and Patient being monitored Patient Re-evaluated:Patient Re-evaluated prior to induction Oxygen Delivery Method: Circle system utilized Preoxygenation: Pre-oxygenation with 100% oxygen Induction Type: IV induction Ventilation: Mask ventilation without difficulty LMA: LMA inserted LMA Size: 3.0 Number of attempts: 1 Airway Equipment and Method: Bite block Placement Confirmation: positive ETCO2 Tube secured with: Tape Dental Injury: Teeth and Oropharynx as per pre-operative assessment

## 2017-03-05 NOTE — Progress Notes (Signed)
Assisted Dr. Carignan with left, ultrasound guided, pectoralis block. Side rails up, monitors on throughout procedure. See vital signs in flow sheet. Tolerated Procedure well. 

## 2017-03-05 NOTE — Anesthesia Postprocedure Evaluation (Signed)
Anesthesia Post Note  Patient: Miranda Woods  Procedure(s) Performed: Procedure(s) (LRB): LEFT BREAST LUMPECTOMY WITH RADIOACTIVE SEED AND SENTINEL LYMPH NODE BIOPSY AND BLUE DYE INJECTION (Left)     Patient location during evaluation: PACU Anesthesia Type: General and Regional Level of consciousness: awake and alert Pain management: pain level controlled Vital Signs Assessment: post-procedure vital signs reviewed and stable Respiratory status: spontaneous breathing, nonlabored ventilation, respiratory function stable and patient connected to nasal cannula oxygen Cardiovascular status: blood pressure returned to baseline and stable Postop Assessment: no signs of nausea or vomiting Anesthetic complications: no    Last Vitals:  Vitals:   03/05/17 1045 03/05/17 1100  BP:  123/76  Pulse:  68  Resp:  13  Temp: (!) 36.4 C     Last Pain:  Vitals:   03/05/17 1100  TempSrc:   PainSc: 3                  Montez Hageman

## 2017-03-05 NOTE — OR Nursing (Signed)
Specimen seed at [facility], Clip at [place].  Pinned for identification for pathology, but not documented on specimen requisition.

## 2017-03-05 NOTE — Interval H&P Note (Signed)
History and Physical Interval Note:  03/05/2017 9:02 AM  Miranda Woods  has presented today for surgery, with the diagnosis of LEFT BREAST CANCER  The various methods of treatment have been discussed with the patient and family. After consideration of risks, benefits and other options for treatment, the patient has consented to  Procedure(s): LEFT BREAST LUMPECTOMY WITH RADIOACTIVE SEED AND SENTINEL LYMPH NODE BIOPSY AND BLUE DYE INJECTION (Left) as a surgical intervention .  The patient's history has been reviewed, patient examined, no change in status, stable for surgery.  I have reviewed the patient's chart and labs.  Questions were answered to the patient's satisfaction.     Lestine Rahe

## 2017-03-05 NOTE — Discharge Instructions (Signed)
[facility] °Office Phone Number [phone] ° °POST OP INSTRUCTIONS ° °Always review your discharge instruction sheet given to you by the facility where your surgery was performed. ° °IF YOU HAVE DISABILITY OR FAMILY LEAVE FORMS, YOU MUST BRING THEM TO THE OFFICE FOR PROCESSING.  DO NOT GIVE THEM TO YOUR DOCTOR. ° °1. A prescription for pain medication may be given to you upon discharge.  Take your pain medication as prescribed, if needed.  If narcotic pain medicine is not needed, then you may take acetaminophen (Tylenol), naprosyn (Alleve) or ibuprofen (Advil) as needed. °2. Take your usually prescribed medications unless otherwise directed °3. If you need a refill on your pain medication, please contact your pharmacy.  They will contact our office to request authorization.  Prescriptions will not be filled after 5pm or on week-ends. °4. You should eat very light the first 24 hours after surgery, such as soup, crackers, pudding, etc.  Resume your normal diet the day after surgery. °5. Most patients will experience some swelling and bruising in the breast.  Ice packs and a good support bra will help.  Wear the breast binder provided or a sports bra for 72 hours day and night.  After that wear a sports bra during the day until you return to the office. Swelling and bruising can take several days to resolve.  °6. It is common to experience some constipation if taking pain medication after surgery.  Increasing fluid intake and taking a stool softener will usually help or prevent this problem from occurring.  A mild laxative (Milk of Magnesia or Miralax) should be taken according to package directions if there are no bowel movements after 48 hours. °7. Unless discharge instructions indicate otherwise, you may remove your bandages 48 hours after surgery and you may shower at that time.  You may have steri-strips (small skin tapes) in place directly over the incision.  These strips should be left on the  skin for 7-10 days and will come off on their own.  If your surgeon used skin glue on the incision, you may shower in 24 hours.  The glue will flake off over the next 2-3 weeks.  Any sutures or staples will be removed at the office during your follow-up visit. °8. ACTIVITIES:  You may resume regular daily activities (gradually increasing) beginning the next day.  Wearing a good support bra or sports bra minimizes pain and swelling.  You may have sexual intercourse when it is comfortable. °a. You may drive when you no longer are taking prescription pain medication, you can comfortably wear a seatbelt, and you can safely maneuver your car and apply brakes. °b. RETURN TO WORK:  ______________________________________________________________________________________ °9. You should see your doctor in the office for a follow-up appointment approximately two weeks after your surgery.  Your doctor’s nurse will typically make your follow-up appointment when she calls you with your pathology report.  Expect your pathology report 3-4 business days after your surgery.  You may call to check if you do not hear from us after three days. °10. OTHER INSTRUCTIONS: _______________________________________________________________________________________________ _____________________________________________________________________________________________________________________________________ °_____________________________________________________________________________________________________________________________________ °_____________________________________________________________________________________________________________________________________ ° °WHEN TO CALL DR WAKEFIELD: °1. Fever over 101.0 °2. Nausea and/or vomiting. °3. Extreme swelling or bruising. °4. Continued bleeding from incision. °5. Increased pain, redness, or drainage from the incision. ° °The clinic staff is available to answer your questions during regular  business hours.  Please don’t hesitate to call and ask to speak to one of the nurses for clinical concerns.  If   you have a medical emergency, go to the nearest emergency room or call 911.  A surgeon from [facility] is always on call at the hospital. ° °For further questions, please visit [email or web address] ° ° ° ° ° °Post Anesthesia Home Care Instructions ° °Activity: °Get plenty of rest for the remainder of the day. A responsible individual must stay with you for 24 hours following the procedure.  °For the next 24 hours, DO NOT: °-Drive a car °-Operate machinery °-Drink alcoholic beverages °-Take any medication unless instructed by your physician °-Make any legal decisions or sign important papers. ° °Meals: °Start with liquid foods such as gelatin or soup. Progress to regular foods as tolerated. Avoid greasy, spicy, heavy foods. If nausea and/or vomiting occur, drink only clear liquids until the nausea and/or vomiting subsides. Call your physician if vomiting continues. ° °Special Instructions/Symptoms: °Your throat may feel dry or sore from the anesthesia or the breathing tube placed in your throat during surgery. If this causes discomfort, gargle with warm salt water. The discomfort should disappear within 24 hours. ° °If you had a scopolamine patch placed behind your ear for the management of post- operative nausea and/or vomiting: ° °1. The medication in the patch is effective for 72 hours, after which it should be removed.  Wrap patch in a tissue and discard in the trash. Wash hands thoroughly with soap and water. °2. You may remove the patch earlier than 72 hours if you experience unpleasant side effects which may include dry mouth, dizziness or visual disturbances. °3. Avoid touching the patch. Wash your hands with soap and water after contact with the patch. °  ° °

## 2017-03-05 NOTE — H&P (Signed)
61 yof here for second opinion on left breast cancer. She palpated left breast mass that was evaluated. Density is d. she had a 4.3 cm mass in inner left breast with calcs. US shows a mass at 9 oclock 1 cm from nipple that measures 4.7x3.3x3.6 cm There is no axillary adenopathy. 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 She had mri also that shows two areas in right breast that have subsequently biopsied and are benign and concordant. The left side has a 3.4x3.4x3.5 cm mass with anbl thick cortex node that on f/u US is normal appearing. she had port placed for primary chemo and has now completed her chemo June 27. she has a repeat mri that shows a 2.4x2.7x2.3 cm mass and negative nodes. she has done well and returns to schedule surgery  Past Surgical History  Appendectomy  Breast Biopsy  Left. Foot Surgery  Left. Shoulder Surgery  Left.  Diagnostic Studies History  Colonoscopy  never Mammogram  within last year Pap Smear  1-5 years ago  Allergies  No Known Drug Allergies  Codeine Phosphate *ANALGESICS - OPIOID*  Allergies Reconciled   Medication History  Ativan (0.5MG  Tablet, Oral) Active. Pepcid (20MG  Tablet, Oral) Active. Letrozole (2.5MG  Tablet, Oral) Active. Methylphenidate HCl (5MG  Tablet, Oral) Active. Medications Reconciled  Social History  Alcohol use  Moderate alcohol use. Caffeine use  Coffee, Tea. No drug use  Tobacco use  Former smoker.  Family History  Breast Cancer  Family Members In General. Diabetes Mellitus  Brother. Heart Disease  Brother, Father. Heart disease in female family member before age 58  Hypertension  Brother, Father. Thyroid problems  Mother.  Vitals  Weight: 121.6 lb Height: 65in Body Surface Area: 1.6 m Body Mass Index: 20.24 kg/m  Temp.: 98.4F  Pulse: 87 (Regular)  P.OX: 99% (Room air) BP: 120/78 (Sitting, Left Arm, Standard) Physical Exam  General Mental  Status-Alert. Orientation-Oriented X3. Head and Neck Note: no thyromegaly Eye Sclera/Conjunctiva - Bilateral-No scleral icterus. Chest and Lung Exam Chest and lung exam reveals -on auscultation, normal breath sounds, no adventitious sounds and normal vocal resonance. Breast Nipples-No Discharge. Note: left breast upper inner quadrant mass that measures about 2cm Cardiovascular Cardiovascular examination reveals -normal heart sounds, regular rate and rhythm with no murmurs. Lymphatic Head & Neck General Head & Neck Lymphatics: Bilateral - Description - Normal. Axillary General Axillary Region: Bilateral - Description - Normal. 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 We discussed all of the different options for treatment for breast cancer including surgery, chemotherapy, radiation therapy, Herceptin, and antiestrogen therapy. We discussed a sentinel lymph node biopsy as she does not appear to having lymph node involvement right now. We discussed that there is a chance of having a positive node with a sentinel lymph node biopsy and we will await the permanent pathology to make any other first further decisions in terms of her treatment. One of these options might be to return to the operating room to perform an axillary lymph node dissection. We discussed up to a 5% risk lifetime of chronic shoulder pain as well as lymphedema associated with a sentinel lymph node biopsy. We discussed the options for treatment of the breast cancer which included lumpectomy versus a mastectomy. We discussed the performance of the lumpectomy with radioactive  seed placement. We discussed a 5-10% chance of a positive margin requiring reexcision in the operating room. We also discussed that she will need radiation  therapy if she undergoes lumpectomy. The breast cannot undergo more radiation therapy in the same breast after lumpectomy in the future. We discussed the mastectomy (removal of whole breast) and the postoperative care for that as well. Mastectomy can be followed by reconstruction. This is a more more extensive surgery and requires more recovery. The decision for lumpectomy vs mastectomy has no impact on decision for chemotherapy. Most mastectomy patients will not need radiation therapy. We discussed that there is no difference in her survival whether she undergoes lumpectomy with radiation therapy or antiestrogen therapy versus a mastectomy. There is also no real difference between her recurrence in the breast. We discussed the risks of operation

## 2017-03-06 ENCOUNTER — Other Ambulatory Visit: Payer: Self-pay | Admitting: Emergency Medicine

## 2017-03-06 ENCOUNTER — Encounter (HOSPITAL_BASED_OUTPATIENT_CLINIC_OR_DEPARTMENT_OTHER): Payer: Self-pay | Admitting: General Surgery

## 2017-03-06 DIAGNOSIS — C50212 Malignant neoplasm of upper-inner quadrant of left female breast: Secondary | ICD-10-CM

## 2017-03-06 DIAGNOSIS — Z170 Estrogen receptor positive status [ER+]: Principal | ICD-10-CM

## 2017-03-07 ENCOUNTER — Ambulatory Visit (HOSPITAL_BASED_OUTPATIENT_CLINIC_OR_DEPARTMENT_OTHER): Payer: BLUE CROSS/BLUE SHIELD

## 2017-03-07 ENCOUNTER — Ambulatory Visit: Payer: BLUE CROSS/BLUE SHIELD | Admitting: Hematology and Oncology

## 2017-03-07 ENCOUNTER — Other Ambulatory Visit (HOSPITAL_BASED_OUTPATIENT_CLINIC_OR_DEPARTMENT_OTHER): Payer: BLUE CROSS/BLUE SHIELD

## 2017-03-07 VITALS — BP 113/73 | HR 56 | Temp 98.4°F | Resp 20

## 2017-03-07 DIAGNOSIS — C50212 Malignant neoplasm of upper-inner quadrant of left female breast: Secondary | ICD-10-CM | POA: Diagnosis not present

## 2017-03-07 DIAGNOSIS — Z170 Estrogen receptor positive status [ER+]: Principal | ICD-10-CM

## 2017-03-07 DIAGNOSIS — Z5112 Encounter for antineoplastic immunotherapy: Secondary | ICD-10-CM | POA: Diagnosis not present

## 2017-03-07 LAB — CBC WITH DIFFERENTIAL/PLATELET
BASO%: 0.7 % (ref 0.0–2.0)
BASOS ABS: 0 10*3/uL (ref 0.0–0.1)
EOS%: 1.7 % (ref 0.0–7.0)
Eosinophils Absolute: 0.1 10*3/uL (ref 0.0–0.5)
HCT: 34.2 % — ABNORMAL LOW (ref 34.8–46.6)
HGB: 11.6 g/dL (ref 11.6–15.9)
LYMPH%: 50.5 % — ABNORMAL HIGH (ref 14.0–49.7)
MCH: 33.4 pg (ref 25.1–34.0)
MCHC: 33.9 g/dL (ref 31.5–36.0)
MCV: 98.6 fL (ref 79.5–101.0)
MONO#: 0.4 10*3/uL (ref 0.1–0.9)
MONO%: 9.4 % (ref 0.0–14.0)
NEUT%: 37.7 % — ABNORMAL LOW (ref 38.4–76.8)
NEUTROS ABS: 1.5 10*3/uL (ref 1.5–6.5)
NRBC: 0 % (ref 0–0)
Platelets: 177 10*3/uL (ref 145–400)
RBC: 3.47 10*6/uL — AB (ref 3.70–5.45)
RDW: 12.8 % (ref 11.2–14.5)
WBC: 4 10*3/uL (ref 3.9–10.3)
lymph#: 2 10*3/uL (ref 0.9–3.3)

## 2017-03-07 LAB — COMPREHENSIVE METABOLIC PANEL
ALT: 17 U/L (ref 0–55)
AST: 16 U/L (ref 5–34)
Albumin: 3.6 g/dL (ref 3.5–5.0)
Alkaline Phosphatase: 48 U/L (ref 40–150)
Anion Gap: 8 mEq/L (ref 3–11)
BILIRUBIN TOTAL: 0.5 mg/dL (ref 0.20–1.20)
BUN: 14 mg/dL (ref 7.0–26.0)
CO2: 27 meq/L (ref 22–29)
Calcium: 9.3 mg/dL (ref 8.4–10.4)
Chloride: 104 mEq/L (ref 98–109)
Creatinine: 0.6 mg/dL (ref 0.6–1.1)
EGFR: >90 mL/min/{1.73_m2} (ref >90)
GLUCOSE: 78 mg/dL (ref 70–140)
POTASSIUM: 3.9 meq/L (ref 3.5–5.1)
SODIUM: 139 meq/L (ref 136–145)
TOTAL PROTEIN: 6.2 g/dL — AB (ref 6.4–8.3)

## 2017-03-07 MED ORDER — DIPHENHYDRAMINE HCL 25 MG PO CAPS
ORAL_CAPSULE | ORAL | Status: AC
  Filled 2017-03-07: qty 2

## 2017-03-07 MED ORDER — TRASTUZUMAB CHEMO 150 MG IV SOLR
6.0000 mg/kg | Freq: Once | INTRAVENOUS | Status: AC
  Administered 2017-03-07: 357 mg via INTRAVENOUS
  Filled 2017-03-07: qty 17

## 2017-03-07 MED ORDER — ACETAMINOPHEN 325 MG PO TABS
ORAL_TABLET | ORAL | Status: AC
  Filled 2017-03-07: qty 2

## 2017-03-07 MED ORDER — SODIUM CHLORIDE 0.9 % IV SOLN
420.0000 mg | Freq: Once | INTRAVENOUS | Status: AC
  Administered 2017-03-07: 420 mg via INTRAVENOUS
  Filled 2017-03-07: qty 14

## 2017-03-07 MED ORDER — SODIUM CHLORIDE 0.9% FLUSH
10.0000 mL | INTRAVENOUS | Status: DC | PRN
  Administered 2017-03-07: 10 mL
  Filled 2017-03-07: qty 10

## 2017-03-07 MED ORDER — ACETAMINOPHEN 325 MG PO TABS
650.0000 mg | ORAL_TABLET | Freq: Once | ORAL | Status: AC
  Administered 2017-03-07: 650 mg via ORAL

## 2017-03-07 MED ORDER — SODIUM CHLORIDE 0.9 % IV SOLN
Freq: Once | INTRAVENOUS | Status: AC
  Administered 2017-03-07: 10:00:00 via INTRAVENOUS

## 2017-03-07 MED ORDER — HEPARIN SOD (PORK) LOCK FLUSH 100 UNIT/ML IV SOLN
500.0000 [IU] | Freq: Once | INTRAVENOUS | Status: AC | PRN
  Administered 2017-03-07: 500 [IU]
  Filled 2017-03-07: qty 5

## 2017-03-07 MED ORDER — DIPHENHYDRAMINE HCL 25 MG PO CAPS
50.0000 mg | ORAL_CAPSULE | Freq: Once | ORAL | Status: AC
Start: 2017-03-07 — End: 2017-03-07
  Administered 2017-03-07: 50 mg via ORAL

## 2017-03-07 MED ORDER — LIDOCAINE-PRILOCAINE 2.5-2.5 % EX CREA
TOPICAL_CREAM | CUTANEOUS | Status: AC
  Filled 2017-03-07: qty 5

## 2017-03-07 NOTE — Patient Instructions (Signed)
[facility] Discharge Instructions for Patients Receiving Chemotherapy  Today you received the following chemotherapy agents herceptin and perjeta  To help prevent nausea and vomiting after your treatment, we encourage you to take your nausea medication : as directed  If you develop nausea and vomiting that is not controlled by your nausea medication, call the clinic.   BELOW ARE SYMPTOMS THAT SHOULD BE REPORTED IMMEDIATELY:  *FEVER GREATER THAN 100.5 F  *CHILLS WITH OR WITHOUT FEVER  NAUSEA AND VOMITING THAT IS NOT CONTROLLED WITH YOUR NAUSEA MEDICATION  *UNUSUAL SHORTNESS OF BREATH  *UNUSUAL BRUISING OR BLEEDING  TENDERNESS IN MOUTH AND THROAT WITH OR WITHOUT PRESENCE OF ULCERS  *URINARY PROBLEMS  *BOWEL PROBLEMS  UNUSUAL RASH Items with * indicate a potential emergency and should be followed up as soon as possible.  Feel free to call the clinic you have any questions or concerns. The clinic phone number is [phone].  Please show the [place] at check-in to the Emergency Department and triage nurse.

## 2017-03-09 ENCOUNTER — Other Ambulatory Visit: Payer: Self-pay | Admitting: *Deleted

## 2017-03-09 DIAGNOSIS — Z170 Estrogen receptor positive status [ER+]: Principal | ICD-10-CM

## 2017-03-09 DIAGNOSIS — C50212 Malignant neoplasm of upper-inner quadrant of left female breast: Secondary | ICD-10-CM

## 2017-03-15 ENCOUNTER — Other Ambulatory Visit: Payer: Self-pay | Admitting: *Deleted

## 2017-03-15 DIAGNOSIS — Z170 Estrogen receptor positive status [ER+]: Principal | ICD-10-CM

## 2017-03-15 DIAGNOSIS — G4701 Insomnia due to medical condition: Secondary | ICD-10-CM

## 2017-03-15 DIAGNOSIS — C50212 Malignant neoplasm of upper-inner quadrant of left female breast: Secondary | ICD-10-CM

## 2017-03-15 MED ORDER — ESZOPICLONE 2 MG PO TABS: 2.0000 mg | ORAL_TABLET | Freq: Every evening | ORAL | 1 refills | Status: DC | PRN

## 2017-03-15 NOTE — Telephone Encounter (Signed)
Received call from patient stating she is having trouble sleeping at night.  She has tried the lorazepam and benadryl but is still only getting 2-3hours a night.  [place] called into her pharmacy for her and also place a referral to PT per patient request.

## 2017-03-16 ENCOUNTER — Other Ambulatory Visit: Payer: Self-pay | Admitting: *Deleted

## 2017-03-16 DIAGNOSIS — C50212 Malignant neoplasm of upper-inner quadrant of left female breast: Secondary | ICD-10-CM

## 2017-03-16 DIAGNOSIS — Z170 Estrogen receptor positive status [ER+]: Principal | ICD-10-CM

## 2017-03-20 ENCOUNTER — Telehealth: Payer: Self-pay | Admitting: Hematology and Oncology

## 2017-03-20 NOTE — Telephone Encounter (Signed)
Spoke with pt and scheduled a nutrition appt for August 31st.

## 2017-03-21 ENCOUNTER — Ambulatory Visit: Payer: BLUE CROSS/BLUE SHIELD | Admitting: Physical Therapy

## 2017-03-22 ENCOUNTER — Ambulatory Visit: Payer: BLUE CROSS/BLUE SHIELD | Attending: Hematology and Oncology | Admitting: Physical Therapy

## 2017-03-22 DIAGNOSIS — R29898 Other symptoms and signs involving the musculoskeletal system: Secondary | ICD-10-CM | POA: Diagnosis present

## 2017-03-22 DIAGNOSIS — Z483 Aftercare following surgery for neoplasm: Secondary | ICD-10-CM

## 2017-03-22 NOTE — Progress Notes (Signed)
Location of Breast Cancer: Left Breast  Histology per Pathology Report:  09/14/16 Diagnosis Breast, left, needle core biopsy, 9:00 o'clock - INVASIVE DUCTAL CARCINOMA, SEE COMMENT. - FIBROADENOMATOID CHANGE WITH CALCIFICATIONS  Receptor Status: ER(95%), PR (50%), Her2-neu (POS), Ki-(20%)  03/05/17 Diagnosis 1. 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 - INVASIVE CARCINOMA COMES TO WITHIN <0.1 CM OF THE MEDIAL AND POSTERIOR MARGIN FOCALLY, 0.2 CM OF THE INFERIOR MARGIN FOCALLY, AND 0.3 CM OF THE ANTERIOR MARGIN FOCALLY. - TREATMENT EFFECT. - SEE ONCOLOGY TABLE. 2. Lymph node, sentinel, biopsy, Left axillary - ONE OF ONE LYMPH NODES NEGATIVE FOR CARCINOMA (0/1). 3. Lymph node, sentinel, biopsy, Left axillary - ONE OF ONE LYMPH NODES NEGATIVE FOR CARCINOMA (0/1). Did patient present with symptoms or was this found on screening mammography?:   Past/Anticipated interventions by surgeon, if any: 03/05/17 Procedure: Left breast seed guided lumpectomy, Leftdeep axillary sentinel node biopsy, injection blue dye for sentinel node identification Surgeon: Dr Serita Grammes  Past/Anticipated interventions by medical oncology, if any: Dr. Lindi Adie 02/14/17 10/11/16 -  Taxotere, Carboplatin, Herceptin, Perjeta x 6 cycles followed by Herceptin and Perjeta maintenance for one year  Recommendationbased on multidisciplinary tumor board: 1. Neoadjuvant chemotherapy with TCH Perjeta 6 cycles followed by Herceptin and Perjetamaintenance for 1 year 2. Followed by breast conserving surgery if possible with sentinel lymph node study 3. Followed by adjuvant radiation therapy if patient had lumpectomy 4. Followed by adjuvant antiestrogen therapy   Lymphedema issues, if any:  She denies. She has good arm mobility  Pain issues, if any:  She denies  SAFETY ISSUES:  Prior radiation? No  Pacemaker/ICD? No  Possible current pregnancy? No  Is the patient  on methotrexate? No  Current Complaints / other details:   BP 126/82   Pulse 75   Temp 98.1 F (36.7 C)   Ht _0  (1.651 m)   Wt 116 lb 12.8 oz (53 kg)   LMP 11/28/2011   SpO2 97% Comment: room air  BMI 19.44 kg/m    Wt Readings from Last 3 Encounters:  03/28/17 116 lb 12.8 oz (53 kg)  03/05/17 119 lb 8 oz (54.2 kg)  02/14/17 119 lb 11.2 oz (54.3 kg)      Miranda Woods, Stephani Police, RN 03/22/2017,9:58 AM

## 2017-03-22 NOTE — Therapy (Addendum)
[place], [place], [address] Phone: [phone]   Fax:  [phone]  Physical Therapy Evaluation  Patient Details  Name: Miranda Woods MRN: [id number] Date of Birth: September 12, 1955 Referring Provider: Dr. Nicholas Lose  Encounter Date: 03/22/2017      PT End of Session - 03/22/17 1155    Visit Number 1   Number of Visits 3   Date for PT Re-Evaluation 04/27/17   PT Start Time 0935   PT Stop Time 1020   PT Time Calculation (min) 45 min   Activity Tolerance Patient tolerated treatment well   Behavior During Therapy [facility] for tasks assessed/performed      Past Medical History:  Diagnosis Date  . ADHD (attention deficit hyperactivity disorder)   . Cancer ([place]) 12/2016   left breast cancer  . Fibrocystic breast disease     Past Surgical History:  Procedure Laterality Date  . ANKLE FRACTURE SURGERY Left   . APPENDECTOMY    . BREAST LUMPECTOMY WITH RADIOACTIVE SEED AND SENTINEL LYMPH NODE BIOPSY Left 03/05/2017   Procedure: LEFT BREAST LUMPECTOMY WITH RADIOACTIVE SEED AND SENTINEL LYMPH NODE BIOPSY AND BLUE DYE INJECTION;  Surgeon: Rolm Bookbinder, MD;  Location: [facility];  Service: General;  Laterality: Left;  . HUMERUS FRACTURE SURGERY Left   . IR GENERIC HISTORICAL  10/10/2016   IR US GUIDE VASC ACCESS RIGHT 10/10/2016 Jacqulynn Cadet, MD [facility]INTER[facility]  . IR GENERIC HISTORICAL  10/10/2016   IR FLUORO GUIDE PORT INSERTION RIGHT 10/10/2016 Jacqulynn Cadet, MD [facility]  . ORIF WRIST FRACTURE    . TEMPOROMANDIBULAR JOINT SURGERY  1986    There were no vitals filed for this visit.       Subjective Assessment - 03/22/17 0936    Subjective "I scheduled the appointment originally because I had some swelling.  I just had surgery August 6th.  Actually, the reason I'm here is preventative.  I just want some pointers.  My plan is to do an about face with how I take care of myself.   Maybe extreme fitness--I don't know.  I am terrified of lymphedema.  I want to know what I can to do prevent it."   Pertinent History Left breast cancer diagnosed in February 2018.  Was at 09/20/16 [facility]. Started chemo on 10/11/16 and had 6 rounds, completed 01/24/17.  Surgery on 03/05/17 for lumpectomy and SLNB (2 nodes removed, negative).  Had some swelling last week at left inferior axilla so called Dr. Cristal Generous office.  Will start radiation sometime soon. Will continue with Herceptin and Projeta for 6-12 months. ADHD.  h/o left wrist, upper arm, and ankle fractures from horseback riding before age 76.   Patient Stated Goals avoid lymphedema, get guidance about exercise plans   Currently in Pain? Yes   Pain Score 4    Pain Location Axilla   Pain Orientation Left   Pain Descriptors / Indicators Burning;Sharp   Pain Frequency Intermittent   Aggravating Factors  around the scar   Pain Relieving Factors it just goes away            [facility] PT Assessment - 03/22/17 0001      Assessment   Medical Diagnosis left breast cancer   Referring Provider Dr. Nicholas Lose   Onset Date/Surgical Date 03/05/17   Hand Dominance Right   Prior Therapy none     Precautions   Precautions Other (comment)   Precaution Comments cancer precautions  Restrictions   Weight Bearing Restrictions No     Balance Screen   Has the patient fallen in the past 6 months No   Has the patient had a decrease in activity level because of a fear of falling?  No   Is the patient reluctant to leave their home because of a fear of falling?  No     Home Environment   Living Environment Private residence   Living Arrangements Alone   Type of Home Other(Comment)  [place] to enter;E[place]     Prior Function   Level of Independence Independent   Vocation Full time employment;Self employed   Vocation Requirements psychotherapist dealing with trauma  no heavy lifting; works with kids and walks and talks    Leisure did some jogging yesterday; does power walking 45 minutes 2x/day every day, sometimes more; does some UE strengthening     Cognition   Overall Cognitive Status Within Functional Limits for tasks assessed     Observation/Other Assessments   Observations no visible swelling   Skin Integrity left axilla scar approx. 1.25 inches long, with two small scabs; left nipple border scar with two steri strips in place and still healing, but looks good.     ROM / Strength   AROM / PROM / Strength AROM     AROM   AROM Assessment Site Shoulder   Right/Left Shoulder Left   Left Shoulder Extension 60 Degrees   Left Shoulder Flexion 167 Degrees   Left Shoulder ABduction 180 Degrees   Left Shoulder Internal Rotation 80 Degrees   Left Shoulder External Rotation 95 Degrees           LYMPHEDEMA/ONCOLOGY QUESTIONNAIRE - 03/22/17 0954      Type   Cancer Type left breast     Surgeries   Lumpectomy Date 03/05/17   Sentinel Lymph Node Biopsy Date 03/05/17   Number Lymph Nodes Removed 2     Treatment   Past Chemotherapy Treatment Yes   Date 01/24/17   Active Radiation Treatment No  will start September     Lymphedema Assessments   Lymphedema Assessments Upper extremities     Right Upper Extremity Lymphedema   10 cm Proximal to Olecranon Process 23 cm   Olecranon Process 20.7 cm   10 cm Proximal to Ulnar Styloid Process 17.4 cm   Just Proximal to Ulnar Styloid Process 13.4 cm   Across Hand at [employer] 17.2 cm   At [place] of 2nd Digit 5.5 cm     Left Upper Extremity Lymphedema   10 cm Proximal to Olecranon Process 23.2 cm   Olecranon Process 20.8 cm   10 cm Proximal to Ulnar Styloid Process 17 cm   Just Proximal to Ulnar Styloid Process 13.3 cm   Across Hand at [employer] 16 cm   At [place] of 2nd Digit 5.1 cm   Other has lost at least 20 lbs. since diagnosis         Objective measurements completed on examination: See above findings.                           [place] - 03/22/17 1203      CC Long Term Goal  #1   Title Pt. will be knowledgeable about lymphedema risk reduction practices.   Time 3   Period Weeks   Status New   Target Date 04/27/17  CC Long Term Goal  #2   Title Pt. will be knowledgeable about safe strengthening exercise program.   Time 2   Period Weeks   Status New   Target Date 04/13/17             Plan - 03/22/17 1156    Clinical Impression Statement This is a pleasant woman s/p neo-adjuvant chemotherapy, lumpectomy and SLNB for left breast cancer; she will also have radiation soon.  She is doing well with ROM at 3 weeks post surgery and she shows no sign of swelling today.  She did have an episode of left upper flank swelling a week ago that seems to have resolved.  She has a low risk for lymphedema but will benefit from attending our free ABC class for information about lymphedema risk reduction.  She is already doing a lot of power walking, but wants to do strengthening as well, so will benefit from learning the Strength After Breast Cancer program for safe initiation and progression of exercise.   Clinical Presentation Evolving   Clinical Presentation due to: just 3 weeks post surgery and still to have radiation   Clinical Decision Making Low   Rehab Potential Excellent   PT Frequency 1x / week   PT Duration 2 weeks   PT Treatment/Interventions ADLs/Self Care Home Management;Therapeutic exercise;Patient/family education   PT Next Visit Plan Teach strength ABC program, then review as needed and answer any other questions about safe exercise.   Recommended Other Services ABC class   Consulted and Agree with Plan of Care Patient      Patient will benefit from skilled therapeutic intervention in order to improve the following deficits and impairments:  Decreased knowledge of precautions, Decreased strength  Visit Diagnosis: Aftercare following surgery  for neoplasm - Plan: PT plan of care cert/re-cert  Other symptoms and signs involving the musculoskeletal system - Plan: PT plan of care cert/re-cert     Problem List Patient Active Problem List   Diagnosis Date Noted  . Rash 10/26/2016  . Port catheter in place 10/18/2016  . Malignant neoplasm of upper-inner quadrant of left breast in female, estrogen receptor positive ([place]) 09/19/2016  . ADHD (attention deficit hyperactivity disorder)   . Fibrocystic breast disease     Jas Betten 03/22/2017, 12:06 PM  [place], [place], [address] Phone: [phone]   Fax:  [phone]  Name: DEBY ADGER MRN: [id number] Date of Birth: [birth year]  Serafina Royals, PT 03/22/17 12:08 PM  PHYSICAL THERAPY DISCHARGE SUMMARY  Visits from Start of Care: 1  Current functional level related to goals / functional outcomes: Goals not met.  Patient did not return for planned short course of treatment.   Remaining deficits: Unknown. Patient did not return.   Education / Equipment: none Plan: Patient agrees to discharge.  Patient goals were not met. Patient is being discharged due to not returning since the last visit.  ?????     Serafina Royals, PT 10/25/17 1:31 PM

## 2017-03-27 ENCOUNTER — Other Ambulatory Visit: Payer: Self-pay

## 2017-03-27 DIAGNOSIS — Z170 Estrogen receptor positive status [ER+]: Principal | ICD-10-CM

## 2017-03-27 DIAGNOSIS — C50212 Malignant neoplasm of upper-inner quadrant of left female breast: Secondary | ICD-10-CM

## 2017-03-28 ENCOUNTER — Ambulatory Visit
Admission: RE | Admit: 2017-03-28 | Discharge: 2017-03-28 | Disposition: A | Discharge status: Home / Self Care | Payer: BLUE CROSS/BLUE SHIELD | Source: Ambulatory Visit | Attending: Radiation Oncology | Admitting: Radiation Oncology

## 2017-03-28 ENCOUNTER — Ambulatory Visit (HOSPITAL_BASED_OUTPATIENT_CLINIC_OR_DEPARTMENT_OTHER): Payer: BLUE CROSS/BLUE SHIELD | Admitting: Hematology and Oncology

## 2017-03-28 ENCOUNTER — Ambulatory Visit: Payer: BLUE CROSS/BLUE SHIELD

## 2017-03-28 ENCOUNTER — Other Ambulatory Visit (HOSPITAL_BASED_OUTPATIENT_CLINIC_OR_DEPARTMENT_OTHER): Payer: BLUE CROSS/BLUE SHIELD

## 2017-03-28 ENCOUNTER — Encounter: Payer: Self-pay | Admitting: Radiation Oncology

## 2017-03-28 ENCOUNTER — Encounter: Payer: Self-pay | Admitting: Hematology and Oncology

## 2017-03-28 ENCOUNTER — Ambulatory Visit (HOSPITAL_BASED_OUTPATIENT_CLINIC_OR_DEPARTMENT_OTHER): Payer: BLUE CROSS/BLUE SHIELD

## 2017-03-28 DIAGNOSIS — Z885 Allergy status to narcotic agent status: Secondary | ICD-10-CM | POA: Diagnosis not present

## 2017-03-28 DIAGNOSIS — C50212 Malignant neoplasm of upper-inner quadrant of left female breast: Secondary | ICD-10-CM

## 2017-03-28 DIAGNOSIS — Z170 Estrogen receptor positive status [ER+]: Secondary | ICD-10-CM

## 2017-03-28 DIAGNOSIS — Z95828 Presence of other vascular implants and grafts: Secondary | ICD-10-CM

## 2017-03-28 DIAGNOSIS — Z5112 Encounter for antineoplastic immunotherapy: Secondary | ICD-10-CM | POA: Diagnosis not present

## 2017-03-28 DIAGNOSIS — Z510 Encounter for antineoplastic radiation therapy: Secondary | ICD-10-CM | POA: Insufficient documentation

## 2017-03-28 DIAGNOSIS — Z9889 Other specified postprocedural states: Secondary | ICD-10-CM | POA: Insufficient documentation

## 2017-03-28 LAB — COMPREHENSIVE METABOLIC PANEL
ALT: 12 U/L (ref 0–55)
AST: 13 U/L (ref 5–34)
Albumin: 3.9 g/dL (ref 3.5–5.0)
Alkaline Phosphatase: 64 U/L (ref 40–150)
Anion Gap: 7 mEq/L (ref 3–11)
BILIRUBIN TOTAL: 0.46 mg/dL (ref 0.20–1.20)
BUN: 13 mg/dL (ref 7.0–26.0)
CHLORIDE: 108 meq/L (ref 98–109)
CO2: 26 meq/L (ref 22–29)
Calcium: 9.7 mg/dL (ref 8.4–10.4)
Creatinine: 0.7 mg/dL (ref 0.6–1.1)
Glucose: 147 mg/dl — ABNORMAL HIGH (ref 70–140)
POTASSIUM: 4 meq/L (ref 3.5–5.1)
Sodium: 141 mEq/L (ref 136–145)
Total Protein: 6.9 g/dL (ref 6.4–8.3)

## 2017-03-28 LAB — CBC WITH DIFFERENTIAL/PLATELET
BASO%: 0.6 % (ref 0.0–2.0)
Basophils Absolute: 0 10*3/uL (ref 0.0–0.1)
EOS ABS: 0.1 10*3/uL (ref 0.0–0.5)
EOS%: 1.7 % (ref 0.0–7.0)
HCT: 38.7 % (ref 34.8–46.6)
HGB: 13.3 g/dL (ref 11.6–15.9)
LYMPH%: 33.4 % (ref 14.0–49.7)
MCH: 32.9 pg (ref 25.1–34.0)
MCHC: 34.4 g/dL (ref 31.5–36.0)
MCV: 95.8 fL (ref 79.5–101.0)
MONO#: 0.3 10*3/uL (ref 0.1–0.9)
MONO%: 7.6 % (ref 0.0–14.0)
NEUT#: 2 10*3/uL (ref 1.5–6.5)
NEUT%: 56.7 % (ref 38.4–76.8)
PLATELETS: 187 10*3/uL (ref 145–400)
RBC: 4.04 10*6/uL (ref 3.70–5.45)
RDW: 11.8 % (ref 11.2–14.5)
WBC: 3.4 10*3/uL — ABNORMAL LOW (ref 3.9–10.3)
lymph#: 1.2 10*3/uL (ref 0.9–3.3)

## 2017-03-28 MED ORDER — HEPARIN SOD (PORK) LOCK FLUSH 100 UNIT/ML IV SOLN
500.0000 [IU] | Freq: Once | INTRAVENOUS | Status: AC | PRN
  Administered 2017-03-28: 500 [IU]
  Filled 2017-03-28: qty 5

## 2017-03-28 MED ORDER — DIPHENHYDRAMINE HCL 25 MG PO CAPS
ORAL_CAPSULE | ORAL | Status: AC
  Filled 2017-03-28: qty 2

## 2017-03-28 MED ORDER — TRASTUZUMAB CHEMO 150 MG IV SOLR
300.0000 mg | Freq: Once | INTRAVENOUS | Status: AC
  Administered 2017-03-28: 300 mg via INTRAVENOUS
  Filled 2017-03-28: qty 14.29

## 2017-03-28 MED ORDER — DIPHENHYDRAMINE HCL 25 MG PO CAPS
50.0000 mg | ORAL_CAPSULE | Freq: Once | ORAL | Status: AC
  Administered 2017-03-28: 25 mg via ORAL

## 2017-03-28 MED ORDER — SODIUM CHLORIDE 0.9% FLUSH
10.0000 mL | INTRAVENOUS | Status: DC | PRN
  Administered 2017-03-28: 10 mL
  Filled 2017-03-28: qty 10

## 2017-03-28 MED ORDER — DIPHENHYDRAMINE HCL 50 MG/ML IJ SOLN
INTRAMUSCULAR | Status: AC
  Filled 2017-03-28: qty 1

## 2017-03-28 MED ORDER — SODIUM CHLORIDE 0.9% FLUSH
10.0000 mL | INTRAVENOUS | Status: DC | PRN
  Administered 2017-03-28: 10 mL via INTRAVENOUS
  Filled 2017-03-28: qty 10

## 2017-03-28 MED ORDER — SODIUM CHLORIDE 0.9 % IV SOLN
Freq: Once | INTRAVENOUS | Status: AC
  Administered 2017-03-28: 13:00:00 via INTRAVENOUS

## 2017-03-28 MED ORDER — ACETAMINOPHEN 325 MG PO TABS
ORAL_TABLET | ORAL | Status: AC
  Filled 2017-03-28: qty 2

## 2017-03-28 MED ORDER — ACETAMINOPHEN 325 MG PO TABS
650.0000 mg | ORAL_TABLET | Freq: Once | ORAL | Status: AC
  Administered 2017-03-28: 650 mg via ORAL

## 2017-03-28 NOTE — Assessment & Plan Note (Signed)
Palpable left breast mass for 2 months, ultrasound at 9:00: 4.7 x 3.6 x 3.3 cm, axilla negative: U/S Bx: Grade 2 IDC ER 95%, PR 50%, HER-2 positive ratio 6.17, Ki-67 20%,  Right breast biopsy: PASH  03/05/2017 Left lumpectomy: IDC grade 2, 2.5 cm, margins negative, 0/2 lymph nodes negative, ER 100%, PR 50%, HER-2 positive ratio 4.88, T2 N0 stage IB AJCC 8  Treatment summary/recommendations 1. Neoadjuvant chemotherapy with TCH Perjeta 6 started 10/11/2016 completed 01/24/2017 followed by Herceptin and Perjetamaintenance for 1 year 2. Followed by breast conserving surgery on 03/05/2017 3. Followed by adjuvant radiation therapy 4. Followed by adjuvant antiestrogen therapy ------------------------------------------------------------------------------ Pathology counseling: I discussed the final pathology report of the patient provided  a copy of this report. I discussed the margins as well as lymph node surgeries. We also discussed the final staging along with previously performed ER/PR and HER-2/neu testing.  Plan: 1. Continue Herceptin and Perjeta maintenance 2. radiation therapy 3. Followed by adjuvant antiestrogen therapy  Return to clinic every 3 weeks for Herceptin Perjeta every 6 weeks for follow-up with me 

## 2017-03-28 NOTE — Progress Notes (Signed)
Radiation Oncology         [phone] ________________________________  Name: Miranda Woods MRN: [id number]  Date: 03/28/2017  DOB: [birth year]  Follow-Up Visit Note  Outpatient  CC: Patient, No Pcp Per  Nicholas Lose, MD  Diagnosis:      ICD-10-CM   1. Malignant neoplasm of upper-inner quadrant of left breast in female, estrogen receptor positive ([place]) C50.212    Z17.0     Stage IB T2N0M0 Left Breast UIQ Invasive Ductal Carcinoma, ER+ / PR+ / Her2+, Grade 2 Cancer Staging Malignant neoplasm of upper-inner quadrant of left breast in female, estrogen receptor positive ([place]) Staging form: Breast, AJCC 8th Edition - Clinical stage from 09/20/2016: Stage IB (cT2, cN0, cM0, G2, ER: Positive, PR: Positive, HER2: Positive) - Unsigned  CHIEF COMPLAINT: Here to discuss management of left breast cancer  Narrative:  The patient returns today for follow-up.     Since consultation, she underwent MRI of her breasts on 09/27/2016. This revealed a 3.5 cm necrotic left breast mass with 2 sub-centimeter right breast masses and an abnormal-appearing left axillary node. She underwent right breast biopsy on 10/09/2016 of the two masses, no malignancy appreciated. The abnormal lymph node from MRI was normal on follow-up ultrasound. She began neo-adjuvant chemotherapy on 10/11/2016 with a prescription of Taxotere, Carboplatin, Herceptin, and Perjeta x 6 cycles, followed by Herceptin and Perjeta maintenance for one year. Repeat MRI of the breasts on 01/26/2017 showed the left breast mass had decreased in size to 2.7 cm with no abnormal lymph nodes and no right breast masses.  The patient then underwent left breast lumpectomy and sentinel lymph node biopsy on 03/05/2017. Final pathology revealed grade 2 invasive ductal carcinoma, spanning 2.5 cm. Invasive carcinoma comes to within <0.1 cm of the medial and posterior margin focally, 0.2 cm of the inferior margin focally, and 0.3 cm of the anterior margin  focally. Biopsy revealed negative for carcinoma in 2 left axillary sentinel lymph nodes. Receptor status was ER(95%), PR(50%), Her2(+), and Ki-67(20%).  The patient returns today to discuss adjuvant radiotherapy. On review of systems, she is doing well and has no complaints.           ALLERGIES:  is allergic to codeine.  Meds: Current Outpatient Prescriptions  Medication Sig Dispense Refill  . eszopiclone (LUNESTA) 2 MG TABS tablet Take 1 tablet (2 mg total) by mouth at bedtime as needed for sleep. Take immediately before bedtime 30 tablet 1  . triamcinolone cream (KENALOG) 0.5 % Apply 1 application topically 2 (two) times daily. 30 g 0  . UNABLE TO FIND Med Name: Tonic Alcemy. One scoop every morning    . LORazepam (ATIVAN) 0.5 MG tablet Take 1-2 tablets (0.5-1 mg total) by mouth every 8 (eight) hours as needed for anxiety or sleep. (Patient not taking: Reported on 03/22/2017) 60 tablet 0  . methylphenidate (RITALIN) 5 MG tablet Take 1 tablet (5 mg total) by mouth 2 (two) times daily. (Patient not taking: Reported on 03/28/2017)  0  . oxyCODONE-acetaminophen (PERCOCET) 10-325 MG tablet Take 1 tablet by mouth every 6 (six) hours as needed for pain. (Patient not taking: Reported on 03/22/2017) 12 tablet 0   No current facility-administered medications for this encounter.    Facility-Administered Medications Ordered in Other Encounters  Medication Dose Route Frequency Provider Last Rate Last Dose  . sodium chloride flush (NS) 0.9 % injection 10 mL  10 mL Intravenous PRN Nicholas Lose, MD   10 mL at 01/24/17 1116  .  sodium chloride flush (NS) 0.9 % injection 10 mL  10 mL Intravenous PRN Nicholas Lose, MD   10 mL at 02/14/17 1236   Review of Systems: as above  Physical Findings:  height is _0  (1.651 m) and weight is 116 lb 12.8 oz (53 kg). Her temperature is 98.1 F (36.7 C). Her blood pressure is 126/82 and her pulse is 75. Her oxygen saturation is 97%. .     General: Alert and oriented, in  no acute distress. HEENT: Head is normocephalic. Extraocular movements are intact. Oropharynx is clear. Neck: Neck is supple, no palpable cervical or supraclavicular lymphadenopathy. Heart: Regular in rate and rhythm with no murmurs, rubs, or gallops. Chest: Clear to auscultation bilaterally, with no rhonchi, wheezes, or rales. Lymphatics: see Neck Exam Psychiatric: Judgment and insight are intact. Affect is appropriate.  Breast exam reveals post-operative edema particularly in the 9:00 region of the left breast and a little erythema at the left axillary scar.  Lab Findings: Lab Results  Component Value Date   WBC 3.4 (L) 03/28/2017   HGB 13.3 03/28/2017   HCT 38.7 03/28/2017   MCV 95.8 03/28/2017   PLT 187 03/28/2017    _1 @  Radiographic Findings: Mm Breast Surgical Specimen  Result Date: 03/05/2017 CLINICAL DATA:  Biopsy proven left breast cancer. EXAM: SPECIMEN RADIOGRAPH OF THE LEFT BREAST COMPARISON:  Previous exam(s). FINDINGS: Status post excision of the left breast. The radioactive seed and biopsy marker clip are present, completely intact, and were marked for pathology. IMPRESSION: Specimen radiograph of the left breast. Electronically Signed   By: Lillia Mountain M.D.   On: 03/05/2017 09:56   Mm Lt Radioactive Seed Loc Mammo Guide  Result Date: 02/28/2017 CLINICAL DATA:  Patient with left breast cancer scheduled for breast conservation surgery requiring preoperative radioactive seed localization. EXAM: MAMMOGRAPHIC GUIDED RADIOACTIVE SEED LOCALIZATION OF THE LEFT BREAST COMPARISON:  Previous exam(s). FINDINGS: Patient presents for radioactive seed localization prior to breast conservation surgery. I met with the patient and we discussed the procedure of seed localization including benefits and alternatives. We discussed the high likelihood of a successful procedure. We discussed the risks of the procedure including infection, bleeding, tissue injury and further surgery.  We discussed the low dose of radioactivity involved in the procedure. Informed, written consent was given. The usual time-out protocol was performed immediately prior to the procedure. Using mammographic guidance, sterile technique, 1% lidocaine and an I-125 radioactive seed, the ribbon shaped clip within the inner left breast was localized using a medial approach. The follow-up mammogram images confirm the seed in the expected location and were marked for Dr. Donne Hazel. Follow-up survey of the patient confirms presence of the radioactive seed. Order number of I-125 seed:  [id number]. Total activity:  1.583 millicuries  Reference Date: 02/06/2017 The patient tolerated the procedure well and was released from the [facility]. She was given instructions regarding seed removal. IMPRESSION: Radioactive seed localization left breast. No apparent complications. Electronically Signed   By: Franki Cabot M.D.   On: 02/28/2017 13:33    Impression/Plan: Left breast cancer We discussed adjuvant radiotherapy today.  I recommend radiotherapy to the left breast in order to reduce risk of locoregional recurrence by 2/3.  The risks, benefits and side effects of this treatment were discussed in detail.  She understands that radiotherapy is associated with skin irritation and fatigue in the acute setting. Late effects can include cosmetic changes and rare injury to internal organs.   She is enthusiastic about proceeding  with treatment. A consent form has been signed and placed in her chart. The patient will be scheduled for the CT simulation and planning process on April 23, 2017, to begin radiation treatments the following week.  A total of 3 medically necessary complex treatment devices will be fabricated and supervised by me: 2 fields with MLCs for custom blocks to protect heart, and lungs;  and, a Vac-lok. MORE COMPLEX DEVICES MAY BE MADE IN DOSIMETRY FOR FIELD IN FIELD BEAMS FOR DOSE HOMOGENEITY.  I have requested :  3D Simulation which is medically necessary to give adequate dose to at risk tissues while sparing lungs and heart.  I have requested a DVH of the following structures: lungs, heart, left lumpectomy cavity.    The patient will receive 40.05 Gy in 15 fractions to the left breast with 2 fields.  This will be followed by a boost.  I encouraged the patient to partake in physical activity and to continue avoiding alcohol during her treatment to improve her prognosis.  The patient states that Dr. Donne Hazel is seeing her tomorrow. She states he had mentioned calcifications in her breast at her last appointment with him that he will discuss with her tomorrow.   I spent 30 minutes minutes face to face with the patient and more than 50% of that time was spent in counseling and/or coordination of care. _____________________________________   Eppie Gibson, MD  This document serves as a record of services personally performed by Eppie Gibson, MD. It was created on her behalf by Rae Lips, a trained medical scribe. The creation of this record is based on the scribe's personal observations and the provider's statements to them. This document has been checked and approved by the attending provider.

## 2017-03-28 NOTE — Patient Instructions (Signed)
Cancer Center Discharge Instructions for Patients Receiving Chemotherapy  Today you received the following chemotherapy agents: Herceptin   To help prevent nausea and vomiting after your treatment, we encourage you to take your nausea medication as directed.    If you develop nausea and vomiting that is not controlled by your nausea medication, call the clinic.   BELOW ARE SYMPTOMS THAT SHOULD BE REPORTED IMMEDIATELY:  *FEVER GREATER THAN 100.5 F  *CHILLS WITH OR WITHOUT FEVER  NAUSEA AND VOMITING THAT IS NOT CONTROLLED WITH YOUR NAUSEA MEDICATION  *UNUSUAL SHORTNESS OF BREATH  *UNUSUAL BRUISING OR BLEEDING  TENDERNESS IN MOUTH AND THROAT WITH OR WITHOUT PRESENCE OF ULCERS  *URINARY PROBLEMS  *BOWEL PROBLEMS  UNUSUAL RASH Items with * indicate a potential emergency and should be followed up as soon as possible.  Feel free to call the clinic you have any questions or concerns. The clinic phone number is [phone].  Please show the CHEMO ALERT CARD at check-in to the Emergency Department and triage nurse.   

## 2017-03-28 NOTE — Progress Notes (Signed)
Patient Care Team: Patient, No Pcp Per as PCP - General (General Practice) [name], MD as Consulting Physician (General Surgery) Nicholas Lose, MD as Consulting Physician (Hematology and Oncology) Eppie Gibson, MD as Attending Physician (Radiation Oncology) Gardenia Phlegm, NP as Nurse Practitioner (Hematology and Oncology)  DIAGNOSIS:  Encounter Diagnosis  Name Primary?  . Malignant neoplasm of upper-inner quadrant of left breast in female, estrogen receptor positive ([place])     SUMMARY OF ONCOLOGIC HISTORY:   Malignant neoplasm of upper-inner quadrant of left breast in female, estrogen receptor positive ([facility])   09/14/2016 Initial Diagnosis    Palpable left breast mass for 2 months, ultrasound at 9:00: 4.7 x 3.6 x 3.3 cm, axilla negative: U/S Bx: Grade 2 IDC ER 95%, PR 50%, HER-2 positive ratio 6.17, Ki-67 20%, T2 N0 stage IIA clinical stage      09/27/2016 Breast MRI    Left breast: 3.5 cm irregular necrotic mass; left axillary lymph node 1.2 cm; right breast 0.6 cm mass at 6:00 position 0.5 cm mass right breast lower inner quadrant       10/11/2016 - 01/24/2017 Neo-Adjuvant Chemotherapy    Taxotere, Carboplatin, Herceptin, Perjeta x 6 cycles followed by Herceptin and Perjeta maintenance for one year      01/26/2017 Breast MRI    Left breast: Irregular enhancing mass measuring 2.7 cm, smaller than previous, no abnormal lymph nodes; right breast: No mass or enhancement       03/05/2017 Surgery    Left lumpectomy: IDC grade 2, 2.5 cm, margins negative, 0/2 lymph nodes negative, ER 100%, PR 50%, HER-2 positive ratio 4.88, T2 N0 stage IB AJCC 8        CHIEF COMPLIANT: Follow-up to discuss the pathology report from the left lumpectomy  INTERVAL HISTORY: Miranda Woods is a 61 year old with above-mentioned history left breast cancer treated with neoadjuvant chemotherapy followed by left lumpectomy. She has recovered very well from the recent surgery. She is  making dramatic changes in her diet and activity levels. There are days where she still feels very tired. She is anxious to get to feel better.  REVIEW OF SYSTEMS:   Constitutional: Denies fevers, chills or abnormal weight loss Eyes: Denies blurriness of vision Ears, nose, mouth, throat, and face: Denies mucositis or sore throat Respiratory: Denies cough, dyspnea or wheezes Cardiovascular: Denies palpitation, chest discomfort Gastrointestinal:  Denies nausea, heartburn or change in bowel habits Skin: Denies abnormal skin rashes Lymphatics: Denies new lymphadenopathy or easy bruising Neurological:Denies numbness, tingling or new weaknesses Behavioral/Psych: Mood is stable, no new changes  Extremities: No lower extremity edema Breast: Left lumpectomy All other systems were reviewed with the patient and are negative.  I have reviewed the past medical history, past surgical history, social history and family history with the patient and they are unchanged from previous note.  ALLERGIES:  is allergic to codeine.  MEDICATIONS:  Current Outpatient Prescriptions  Medication Sig Dispense Refill  . eszopiclone (LUNESTA) 2 MG TABS tablet Take 1 tablet (2 mg total) by mouth at bedtime as needed for sleep. Take immediately before bedtime 30 tablet 1  . LORazepam (ATIVAN) 0.5 MG tablet Take 1-2 tablets (0.5-1 mg total) by mouth every 8 (eight) hours as needed for anxiety or sleep. (Patient not taking: Reported on 03/22/2017) 60 tablet 0  . methylphenidate (RITALIN) 5 MG tablet Take 1 tablet (5 mg total) by mouth 2 (two) times daily. (Patient not taking: Reported on 03/28/2017)  0  . oxyCODONE-acetaminophen (PERCOCET) 10-325 MG  tablet Take 1 tablet by mouth every 6 (six) hours as needed for pain. (Patient not taking: Reported on 03/22/2017) 12 tablet 0  . triamcinolone cream (KENALOG) 0.5 % Apply 1 application topically 2 (two) times daily. 30 g 0  . UNABLE TO FIND Med Name: Tonic Alcemy. One scoop every  morning     No current facility-administered medications for this visit.    Facility-Administered Medications Ordered in Other Visits  Medication Dose Route Frequency Provider Last Rate Last Dose  . sodium chloride flush (NS) 0.9 % injection 10 mL  10 mL Intravenous PRN Nicholas Lose, MD   10 mL at 01/24/17 1116  . sodium chloride flush (NS) 0.9 % injection 10 mL  10 mL Intravenous PRN Nicholas Lose, MD   10 mL at 02/14/17 1236    PHYSICAL EXAMINATION: ECOG PERFORMANCE STATUS: 1 - Symptomatic but completely ambulatory  Vitals:   03/28/17 1134  BP: 120/70  Pulse: 75  Resp: 16  Temp: (!) 97.5 F (36.4 C)  SpO2: 100%   Filed Weights   03/28/17 1134  Weight: 116 lb 6.4 oz (52.8 kg)    GENERAL:alert, no distress and comfortable SKIN: skin color, texture, turgor are normal, no rashes or significant lesions EYES: normal, Conjunctiva are pink and non-injected, sclera clear OROPHARYNX:no exudate, no erythema and lips, buccal mucosa, and tongue normal  NECK: supple, thyroid normal size, non-tender, without nodularity LYMPH:  no palpable lymphadenopathy in the cervical, axillary or inguinal LUNGS: clear to auscultation and percussion with normal breathing effort HEART: regular rate & rhythm and no murmurs and no lower extremity edema ABDOMEN:abdomen soft, non-tender and normal bowel sounds MUSCULOSKELETAL:no cyanosis of digits and no clubbing  NEURO: alert & oriented x 3 with fluent speech, no focal motor/sensory deficits EXTREMITIES: No lower extremity edema  LABORATORY DATA:  I have reviewed the data as listed   Chemistry      Component Value Date/Time   NA 141 03/28/2017 1039   K 4.0 03/28/2017 1039   CL 101 11/30/2011 1649   CO2 26 03/28/2017 1039   BUN 13.0 03/28/2017 1039   CREATININE 0.7 03/28/2017 1039      Component Value Date/Time   CALCIUM 9.7 03/28/2017 1039   ALKPHOS 64 03/28/2017 1039   AST 13 03/28/2017 1039   ALT 12 03/28/2017 1039   BILITOT 0.46  03/28/2017 1039       Lab Results  Component Value Date   WBC 3.4 (L) 03/28/2017   HGB 13.3 03/28/2017   HCT 38.7 03/28/2017   MCV 95.8 03/28/2017   PLT 187 03/28/2017   NEUTROABS 2.0 03/28/2017    ASSESSMENT & PLAN:  Malignant neoplasm of upper-inner quadrant of left breast in female, estrogen receptor positive ([facility]) Palpable left breast mass for 2 months, ultrasound at 9:00: 4.7 x 3.6 x 3.3 cm, axilla negative: U/S Bx: Grade 2 IDC ER 95%, PR 50%, HER-2 positive ratio 6.17, Ki-67 20%,  Right breast biopsy: PASH  03/05/2017 Left lumpectomy: IDC grade 2, 2.5 cm, margins negative, 0/2 lymph nodes negative, ER 100%, PR 50%, HER-2 positive ratio 4.88, T2 N0 stage IB AJCC 8  Treatment summary/recommendations 1. Neoadjuvant chemotherapy with TCH Perjeta 6 started 10/11/2016 completed 01/24/2017 followed by Herceptin and Perjetamaintenance for 1 year 2. Followed by breast conserving surgery on 03/05/2017 3. Followed by adjuvant radiation therapy 4. Followed by adjuvant antiestrogen therapy ------------------------------------------------------------------------------ Pathology counseling: I discussed the final pathology report of the patient provided  a copy of this report. I discussed the  margins as well as lymph node surgeries. We also discussed the final staging along with previously performed ER/PR and HER-2/neu testing.  Plan: 1. Discontinue Perjeta maintenance and continue with Herceptin maintenance for 1 year 2. radiation therapy 3. Followed by adjuvant antiestrogen therapy  Return to clinic every 3 weeks for Herceptin every 6 weeks for follow-up with me   I spent 25 minutes talking to the patient of which more than half was spent in counseling and coordination of care.  No orders of the defined types were placed in this encounter.  The patient has a good understanding of the overall plan. she agrees with it. she will call with any problems that may develop before the  next visit here.   Rulon Eisenmenger, MD 03/28/17

## 2017-03-29 ENCOUNTER — Encounter: Payer: Self-pay | Admitting: *Deleted

## 2017-03-30 ENCOUNTER — Ambulatory Visit: Payer: BLUE CROSS/BLUE SHIELD | Admitting: Nutrition

## 2017-03-30 NOTE — Progress Notes (Signed)
61 year old female diagnosed with breast cancer.  She is status post chemotherapy with radiation therapy pending. She is a patient of Dr. Lindi Adie.  Past medical history includes ADHD.  Medications include chia seed oil, flaxseed oil, Ritalin, biotin, Ativan, omega-3 fatty acids, and protein broth, wheat germ oil.  Labs were reviewed.  Height: 65 inches. Weight: 116.8 pounds on August 29. Usual body weight: 128 pounds March 2018 BMI: 19.44.  Patient reports she has lost approximately 10 pounds secondary to dietary changes. She has eliminated red meat and shellfish. Has decreased carbohydrate intake and dairy foods. She also discontinued alcohol. She appears to eat a plant-based diet. Patient is interested in additional information on healthy diet after breast cancer treatments.  Nutrition diagnosis:  Food and nutrition related knowledge deficit related to breast cancer and associated treatments as evidenced by no prior need for nutrition related information.  Intervention: Patient was educated to continue healthy plant-based diet with adequate protein and calories to minimize any further weight loss. Provided multiple fact sheets and resources. Questions were answered.  Teach back method used.  Monitoring, evaluation, goals: Patient will tolerate a healthy plant-based diet to reduce risk for recurrence.  No follow-up required; nutrition diagnosis resolved.  **Disclaimer: This note was dictated with voice recognition software. Similar sounding words can inadvertently be transcribed and this note may contain transcription errors which may not have been corrected upon publication of note.**

## 2017-04-04 ENCOUNTER — Ambulatory Visit: Payer: BLUE CROSS/BLUE SHIELD | Attending: Hematology and Oncology

## 2017-04-10 ENCOUNTER — Other Ambulatory Visit: Payer: Self-pay | Admitting: General Surgery

## 2017-04-10 ENCOUNTER — Other Ambulatory Visit: Payer: Self-pay | Admitting: *Deleted

## 2017-04-10 DIAGNOSIS — C50112 Malignant neoplasm of central portion of left female breast: Secondary | ICD-10-CM

## 2017-04-10 DIAGNOSIS — K121 Other forms of stomatitis: Secondary | ICD-10-CM

## 2017-04-10 DIAGNOSIS — C50212 Malignant neoplasm of upper-inner quadrant of left female breast: Secondary | ICD-10-CM

## 2017-04-10 DIAGNOSIS — Z170 Estrogen receptor positive status [ER+]: Principal | ICD-10-CM

## 2017-04-10 MED ORDER — MAGIC MOUTHWASH W/LIDOCAINE: ORAL | 0 refills | Status: DC

## 2017-04-10 NOTE — Telephone Encounter (Signed)
Received call from patient stating she has some mouth ulcers and is requesting magic mouth wash.  RX called into her pharmacy.

## 2017-04-11 ENCOUNTER — Ambulatory Visit: Payer: BLUE CROSS/BLUE SHIELD

## 2017-04-16 ENCOUNTER — Encounter: Payer: Self-pay | Admitting: Hematology and Oncology

## 2017-04-16 ENCOUNTER — Ambulatory Visit
Admission: RE | Admit: 2017-04-16 | Discharge: 2017-04-16 | Disposition: A | Discharge status: Home / Self Care | Payer: BLUE CROSS/BLUE SHIELD | Source: Ambulatory Visit | Attending: Radiation Oncology | Admitting: Radiation Oncology

## 2017-04-16 DIAGNOSIS — Z510 Encounter for antineoplastic radiation therapy: Secondary | ICD-10-CM | POA: Diagnosis not present

## 2017-04-16 DIAGNOSIS — C50212 Malignant neoplasm of upper-inner quadrant of left female breast: Secondary | ICD-10-CM

## 2017-04-16 DIAGNOSIS — Z170 Estrogen receptor positive status [ER+]: Principal | ICD-10-CM

## 2017-04-16 NOTE — Progress Notes (Signed)
Patient came in to discuss large account balance.  Reviewed patient's billing and noticed that she was on some treatment drugs that may have been eligible for assistance during the time they weren't covered.  Advised patient I would reach out to Rob to ask about this. Advised her proof of her income would be needed to apply if eligible.  She states she is done with chemo and will now be starting Radiation. Gave her Cindy's contact information to apply for the [employer] if interested as well as expenses it covers. Advised her proof of income would be needed as well.  Gave her a [employer] settlement application to complete and return to the business office or myself to return to them. This will allow her to apply for assistance on the balance if she qualifies. Patient has my name and number for any additional financial questions or concerns.

## 2017-04-17 ENCOUNTER — Other Ambulatory Visit: Payer: Self-pay | Admitting: General Surgery

## 2017-04-17 ENCOUNTER — Ambulatory Visit
Admission: RE | Admit: 2017-04-17 | Discharge: 2017-04-17 | Disposition: A | Discharge status: Home / Self Care | Payer: BLUE CROSS/BLUE SHIELD | Source: Ambulatory Visit | Attending: General Surgery | Admitting: General Surgery

## 2017-04-17 ENCOUNTER — Encounter: Payer: Self-pay | Admitting: Hematology and Oncology

## 2017-04-17 DIAGNOSIS — N6320 Unspecified lump in the left breast, unspecified quadrant: Secondary | ICD-10-CM

## 2017-04-17 DIAGNOSIS — C50112 Malignant neoplasm of central portion of left female breast: Secondary | ICD-10-CM

## 2017-04-17 HISTORY — DX: Personal history of antineoplastic chemotherapy: Z92.21

## 2017-04-17 HISTORY — DX: Malignant neoplasm of unspecified site of unspecified female breast: C50.919

## 2017-04-17 NOTE — Progress Notes (Signed)
After speaking with Rob and Aleen Sells, attempted to apply for copay assistance.  Enrolled patient in [place] for Neulasta online. Patient approved up to $10,000 per calendar year. Faxed EOB's for past dates of service to [employer] to see if they will honor copay card on any even though beyond retro period.  Enrolled patient in [place] card program for Herceptin and Perjeta. Patient approved for $25,000 04/17/17-04/16/18 for Herceptin and $25,000 for Perjeta 04/17/17-04/16/18. I called([email or web address]  to find out what the look-back date was and she said 12/18/16. With the previous dos requested being before then, Leda Gauze states she could submit a service request to the manufacturer to see if they would consider those dos. I agreed to do so and she states someone will be calling to obtain more information. Gave my contact name and number.  I will be meeting with the patient tomorrow to discuss.

## 2017-04-17 NOTE — Progress Notes (Signed)
Radiation Oncology         [phone] ________________________________  Name: Miranda Woods MRN: [id number]  Date: 04/16/2017  DOB: 1955/10/19  SIMULATION AND TREATMENT PLANNING NOTE / Special treatment procedure   Outpatient  DIAGNOSIS:     ICD-10-CM   1. Malignant neoplasm of upper-inner quadrant of left breast in female, estrogen receptor positive ([place]) C50.212    Z17.0     NARRATIVE:  The patient was brought to the [place].  Identity was confirmed.  All relevant records and images related to the planned course of therapy were reviewed.  The patient freely provided informed written consent to proceed with treatment after reviewing the details related to the planned course of therapy. The consent form was witnessed and verified by the simulation staff.    Then, the patient was set-up in a stable reproducible supine position for radiation therapy with her ipsilateral arm over her head, and her upper body secured in a custom-made Vac-lok device.  CT images were obtained.  Surface markings were placed.  The CT images were loaded into the planning software.   Special treatment procedure:  Special treatment procedure was performed today due to the extra time and effort required by myself to plan and prepare this patient for deep inspiration breath hold technique.  I have determined cardiac sparing to be of benefit to this patient to prevent long term cardiac damage due to radiation of the heart.  Bellows were placed on the patient's abdomen. To facilitate cardiac sparing, the patient was coached by the radiation therapists on breath hold techniques and breathing practice was performed. Practice waveforms were obtained. The patient was then scanned while maintaining breath hold in the treatment position.  This image was then transferred over to the imaging specialist. The imaging specialist then created a fusion of the free breathing and breath hold scans using the chest  wall as the stable structure. I personally reviewed the fusion in axial, coronal and sagittal image planes.  Excellent cardiac sparing was obtained.  I felt the patient is an appropriate candidate for breath hold and the patient will be treated as such.  The image fusion was then reviewed with the patient to reinforce the necessity of reproducible breath hold.  TREATMENT PLANNING NOTE: Treatment planning then occurred.  The radiation prescription was entered and confirmed.     A total of 3 medically necessary complex treatment devices were fabricated and supervised by me: 2 fields with MLCs for custom blocks to protect heart, and lungs;  and, a Vac-lok. MORE COMPLEX DEVICES MAY BE MADE IN DOSIMETRY FOR FIELD IN FIELD BEAMS FOR DOSE HOMOGENEITY.  I have requested : 3D Simulation which is medically necessary to give adequate dose to at risk tissues while sparing lungs and heart.  I have requested a DVH of the following structures: lungs, heart, left lumpectomy cavity.    The patient will receive 40.05 Gy in 15 fractions to the left breast with 2 tangential fields.  This will be followed by a boost.  Optical Surface Tracking Plan:  Since intensity modulated radiotherapy (IMRT) and 3D conformal radiation treatment methods are predicated on accurate and precise positioning for treatment, intrafraction motion monitoring is medically necessary to ensure accurate and safe treatment delivery. The ability to quantify intrafraction motion without excessive ionizing radiation dose can only be performed with optical surface tracking. Accordingly, surface imaging offers the opportunity to obtain 3D measurements of patient position throughout IMRT and 3D treatments without excessive radiation  exposure. I am ordering optical surface tracking for this patient's upcoming course of radiotherapy.  ________________________________   Reference:  Ursula Alert, J, et al. Surface imaging-based analysis of  intrafraction motion for breast radiotherapy patients.Journal of [place], n. 6, nov. 2014. ISSN [id number].  Available at: <http://www.jacmp.org/index.php/jacmp/article/view/4957>.    -----------------------------------  Eppie Gibson, MD

## 2017-04-18 ENCOUNTER — Encounter: Payer: Self-pay | Admitting: *Deleted

## 2017-04-18 ENCOUNTER — Ambulatory Visit: Payer: BLUE CROSS/BLUE SHIELD

## 2017-04-18 ENCOUNTER — Ambulatory Visit (HOSPITAL_BASED_OUTPATIENT_CLINIC_OR_DEPARTMENT_OTHER): Payer: BLUE CROSS/BLUE SHIELD | Admitting: Hematology and Oncology

## 2017-04-18 ENCOUNTER — Ambulatory Visit (HOSPITAL_BASED_OUTPATIENT_CLINIC_OR_DEPARTMENT_OTHER): Payer: BLUE CROSS/BLUE SHIELD

## 2017-04-18 ENCOUNTER — Other Ambulatory Visit (HOSPITAL_BASED_OUTPATIENT_CLINIC_OR_DEPARTMENT_OTHER): Payer: BLUE CROSS/BLUE SHIELD

## 2017-04-18 ENCOUNTER — Encounter: Payer: Self-pay | Admitting: Hematology and Oncology

## 2017-04-18 DIAGNOSIS — Z170 Estrogen receptor positive status [ER+]: Secondary | ICD-10-CM

## 2017-04-18 DIAGNOSIS — C50212 Malignant neoplasm of upper-inner quadrant of left female breast: Secondary | ICD-10-CM

## 2017-04-18 DIAGNOSIS — Z95828 Presence of other vascular implants and grafts: Secondary | ICD-10-CM

## 2017-04-18 DIAGNOSIS — Z5112 Encounter for antineoplastic immunotherapy: Secondary | ICD-10-CM | POA: Diagnosis not present

## 2017-04-18 DIAGNOSIS — G4701 Insomnia due to medical condition: Secondary | ICD-10-CM

## 2017-04-18 LAB — CBC WITH DIFFERENTIAL/PLATELET
BASO%: 0.3 % (ref 0.0–2.0)
BASOS ABS: 0 10*3/uL (ref 0.0–0.1)
EOS ABS: 0.1 10*3/uL (ref 0.0–0.5)
EOS%: 1.5 % (ref 0.0–7.0)
HEMATOCRIT: 38.3 % (ref 34.8–46.6)
HEMOGLOBIN: 13.3 g/dL (ref 11.6–15.9)
LYMPH#: 1.2 10*3/uL (ref 0.9–3.3)
LYMPH%: 35.5 % (ref 14.0–49.7)
MCH: 32.8 pg (ref 25.1–34.0)
MCHC: 34.7 g/dL (ref 31.5–36.0)
MCV: 94.3 fL (ref 79.5–101.0)
MONO#: 0.3 10*3/uL (ref 0.1–0.9)
MONO%: 9 % (ref 0.0–14.0)
NEUT#: 1.8 10*3/uL (ref 1.5–6.5)
NEUT%: 53.7 % (ref 38.4–76.8)
PLATELETS: 202 10*3/uL (ref 145–400)
RBC: 4.06 10*6/uL (ref 3.70–5.45)
RDW: 11.6 % (ref 11.2–14.5)
WBC: 3.4 10*3/uL — ABNORMAL LOW (ref 3.9–10.3)

## 2017-04-18 LAB — COMPREHENSIVE METABOLIC PANEL
ALBUMIN: 4.1 g/dL (ref 3.5–5.0)
ALK PHOS: 65 U/L (ref 40–150)
ALT: 9 U/L (ref 0–55)
ANION GAP: 9 meq/L (ref 3–11)
AST: 13 U/L (ref 5–34)
BUN: 11.3 mg/dL (ref 7.0–26.0)
CALCIUM: 9.8 mg/dL (ref 8.4–10.4)
CHLORIDE: 107 meq/L (ref 98–109)
CO2: 25 mEq/L (ref 22–29)
Creatinine: 0.6 mg/dL (ref 0.6–1.1)
Glucose: 99 mg/dl (ref 70–140)
POTASSIUM: 4.2 meq/L (ref 3.5–5.1)
Sodium: 140 mEq/L (ref 136–145)
Total Bilirubin: 0.44 mg/dL (ref 0.20–1.20)
Total Protein: 7.1 g/dL (ref 6.4–8.3)

## 2017-04-18 MED ORDER — SODIUM CHLORIDE 0.9% FLUSH
10.0000 mL | INTRAVENOUS | Status: DC | PRN
  Administered 2017-04-18: 10 mL via INTRAVENOUS
  Filled 2017-04-18: qty 10

## 2017-04-18 MED ORDER — ACETAMINOPHEN 325 MG PO TABS
650.0000 mg | ORAL_TABLET | Freq: Once | ORAL | Status: AC
  Administered 2017-04-18: 650 mg via ORAL

## 2017-04-18 MED ORDER — ESZOPICLONE 3 MG PO TABS: 3.0000 mg | ORAL_TABLET | Freq: Every evening | ORAL | 5 refills | Status: DC | PRN

## 2017-04-18 MED ORDER — DIPHENHYDRAMINE HCL 25 MG PO CAPS
50.0000 mg | ORAL_CAPSULE | Freq: Once | ORAL | Status: AC
  Administered 2017-04-18: 25 mg via ORAL

## 2017-04-18 MED ORDER — SODIUM CHLORIDE 0.9% FLUSH
10.0000 mL | INTRAVENOUS | Status: DC | PRN
  Administered 2017-04-18: 10 mL
  Filled 2017-04-18: qty 10

## 2017-04-18 MED ORDER — TRASTUZUMAB CHEMO 150 MG IV SOLR
300.0000 mg | Freq: Once | INTRAVENOUS | Status: AC
  Administered 2017-04-18: 300 mg via INTRAVENOUS
  Filled 2017-04-18: qty 14.29

## 2017-04-18 MED ORDER — SODIUM CHLORIDE 0.9 % IV SOLN
Freq: Once | INTRAVENOUS | Status: AC
  Administered 2017-04-18: 11:00:00 via INTRAVENOUS

## 2017-04-18 MED ORDER — DIPHENHYDRAMINE HCL 25 MG PO CAPS
ORAL_CAPSULE | ORAL | Status: AC
  Filled 2017-04-18: qty 2

## 2017-04-18 MED ORDER — ACETAMINOPHEN 325 MG PO TABS
ORAL_TABLET | ORAL | Status: AC
  Filled 2017-04-18: qty 2

## 2017-04-18 MED ORDER — HEPARIN SOD (PORK) LOCK FLUSH 100 UNIT/ML IV SOLN
500.0000 [IU] | Freq: Once | INTRAVENOUS | Status: AC | PRN
  Administered 2017-04-18: 500 [IU]
  Filled 2017-04-18: qty 5

## 2017-04-18 NOTE — Patient Instructions (Signed)
[facility] Discharge Instructions for Patients Receiving Chemotherapy  Today you received the following chemotherapy agents: Herceptin   To help prevent nausea and vomiting after your treatment, we encourage you to take your nausea medication as directed.    If you develop nausea and vomiting that is not controlled by your nausea medication, call the clinic.   BELOW ARE SYMPTOMS THAT SHOULD BE REPORTED IMMEDIATELY:  *FEVER GREATER THAN 100.5 F  *CHILLS WITH OR WITHOUT FEVER  NAUSEA AND VOMITING THAT IS NOT CONTROLLED WITH YOUR NAUSEA MEDICATION  *UNUSUAL SHORTNESS OF BREATH  *UNUSUAL BRUISING OR BLEEDING  TENDERNESS IN MOUTH AND THROAT WITH OR WITHOUT PRESENCE OF ULCERS  *URINARY PROBLEMS  *BOWEL PROBLEMS  UNUSUAL RASH Items with * indicate a potential emergency and should be followed up as soon as possible.  Feel free to call the clinic you have any questions or concerns. The clinic phone number is [phone].  Please show the CHEMO ALERT CARD at check-in to the Emergency Department and triage nurse.   

## 2017-04-18 NOTE — Assessment & Plan Note (Signed)
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 Neoadjuvant chemotherapy with TCH Perjeta 6 started 10/11/2016 completed 01/24/2017 followed by Herceptin and Perjetamaintenance for 1 year 2.Breast conserving surgery on 03/05/2017 3. Followed by adjuvant radiation therapy 4. Followed by adjuvant antiestrogen therapy ------------------------------------------------------------------------------ Plan: 1. Continue Herceptin and Perjeta maintenance 2. radiation therapy 3. Followed by adjuvant antiestrogen therapy  Return to clinic every 3 weeks for Herceptin Perjeta every 6 weeks for follow-up with me  04/17/2017: 2 groups of suspicious residual calcifications in the left breast spanning 4.8 cm one is anterior to lumpectomy the other is medial aspect of lumpectomy  Patient will need a biopsy/excision of these calcifications. I discussed with Dr. Donne Hazel who is also planning to see her tomorrow.

## 2017-04-18 NOTE — Progress Notes (Signed)
Received denials from [employer] for Neulasta copay assistance due to dos being past the retro period.  Patient came in today to follow up. Discussed [employer]'s denial for copay assistance and approvals for Herceptin and Perjeta but waiting to see if Celso Amy will retro the Herceptin and Perjeta beyond the retro period. Advised her that the approval is going forward with the approval dates after insurance pays and that there is nothing she has to keep up with.  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 Advised patient to contact billing and advise them that she will be applying for [facility] FAA and waiting to hear back about other assistance. Provided patient her account number and billing number. Patient verbalized understanding.  Patient has my card for any additional financial questions or concerns.

## 2017-04-18 NOTE — Progress Notes (Signed)
Patient Care Team: Patient, No Pcp Per as PCP - General (General Practice) [name], MD as Consulting Physician (General Surgery) Nicholas Lose, MD as Consulting Physician (Hematology and Oncology) Eppie Gibson, MD as Attending Physician (Radiation Oncology) Gardenia Phlegm, NP as Nurse Practitioner (Hematology and Oncology)  DIAGNOSIS:  Encounter Diagnoses  Name Primary?  . Malignant neoplasm of upper-inner quadrant of left breast in female, estrogen receptor positive ([place])   . Insomnia due to medical condition     SUMMARY OF ONCOLOGIC HISTORY:   Malignant neoplasm of upper-inner quadrant of left breast in female, estrogen receptor positive ([place])   09/14/2016 Initial Diagnosis    Palpable left breast mass for 2 months, ultrasound at 9:00: 4.7 x 3.6 x 3.3 cm, axilla negative: U/S Bx: Grade 2 IDC ER 95%, PR 50%, HER-2 positive ratio 6.17, Ki-67 20%, T2 N0 stage IIA clinical stage      09/27/2016 Breast MRI    Left breast: 3.5 cm irregular necrotic mass; left axillary lymph node 1.2 cm; right breast 0.6 cm mass at 6:00 position 0.5 cm mass right breast lower inner quadrant       10/11/2016 - 01/24/2017 Neo-Adjuvant Chemotherapy    Taxotere, Carboplatin, Herceptin, Perjeta x 6 cycles followed by Herceptin and Perjeta maintenance for one year      01/26/2017 Breast MRI    Left breast: Irregular enhancing mass measuring 2.7 cm, smaller than previous, no abnormal lymph nodes; right breast: No mass or enhancement       03/05/2017 Surgery    Left lumpectomy: IDC grade 2, 2.5 cm, margins negative, 0/2 lymph nodes negative, ER 100%, PR 50%, HER-2 positive ratio 4.88, T2 N0 stage IB AJCC 8       04/17/2017 Mammogram    2 groups of suspicious residual calcifications in the left breast spanning 4.8 cm one is anterior to lumpectomy the other is medial aspect of lumpectomy       CHIEF COMPLIANT: Follow-up on Herceptin and Perjeta, recent mammogram findings  INTERVAL  HISTORY: CHARDA JANIS is a 61 year old with above-mentioned history of left breast cancer treated with neoadjuvant chemotherapy followed by lumpectomy. She is currently on Herceptin and Perjeta maintenance and appears to be tolerating it fairly well. She denies any diarrhea issues. Recent mammogram was performed which showed 2 groups of suspicious calcifications in each measuring about a centimeter in size one anterior side 1 on the medial side of the prior lumpectomy. She reports to be doing quite well. She is doing meditation and yoga.  REVIEW OF SYSTEMS:   Constitutional: Denies fevers, chills or abnormal weight loss Eyes: Denies blurriness of vision Ears, nose, mouth, throat, and face: Denies mucositis or sore throat Respiratory: Denies cough, dyspnea or wheezes Cardiovascular: Denies palpitation, chest discomfort Gastrointestinal:  Denies nausea, heartburn or change in bowel habits Skin: Denies abnormal skin rashes Lymphatics: Denies new lymphadenopathy or easy bruising Neurological:Denies numbness, tingling or new weaknesses Behavioral/Psych: Mood is stable, no new changes  Extremities: No lower extremity edema  All other systems were reviewed with the patient and are negative.  I have reviewed the past medical history, past surgical history, social history and family history with the patient and they are unchanged from previous note.  ALLERGIES:  is allergic to codeine.  MEDICATIONS:  Current Outpatient Prescriptions  Medication Sig Dispense Refill  . Eszopiclone 3 MG TABS Take 1 tablet (3 mg total) by mouth at bedtime as needed for sleep. Take immediately before bedtime 30 tablet 5  .  methylphenidate (RITALIN) 5 MG tablet Take 1 tablet (5 mg total) by mouth 2 (two) times daily. (Patient not taking: Reported on 03/28/2017)  0  . triamcinolone cream (KENALOG) 0.5 % Apply 1 application topically 2 (two) times daily. 30 g 0  . UNABLE TO FIND Med Name: Tonic Alcemy. One scoop every  morning     No current facility-administered medications for this visit.    Facility-Administered Medications Ordered in Other Visits  Medication Dose Route Frequency Provider Last Rate Last Dose  . heparin lock flush 100 unit/mL  500 Units Intracatheter Once PRN Nicholas Lose, MD      . sodium chloride flush (NS) 0.9 % injection 10 mL  10 mL Intravenous PRN Nicholas Lose, MD   10 mL at 01/24/17 1116  . sodium chloride flush (NS) 0.9 % injection 10 mL  10 mL Intravenous PRN Nicholas Lose, MD   10 mL at 02/14/17 1236  . sodium chloride flush (NS) 0.9 % injection 10 mL  10 mL Intracatheter PRN Nicholas Lose, MD      . trastuzumab (HERCEPTIN) 300 mg in sodium chloride 0.9 % 250 mL chemo infusion  300 mg Intravenous Once Nicholas Lose, MD        PHYSICAL EXAMINATION: ECOG PERFORMANCE STATUS: 1 - Symptomatic but completely ambulatory  Vitals:   04/18/17 1001  BP: 126/83  Pulse: 80  Resp: 20  Temp: 97.6 F (36.4 C)  SpO2: 100%   Filed Weights   04/18/17 1001  Weight: 115 lb 14.4 oz (52.6 kg)    GENERAL:alert, no distress and comfortable SKIN: skin color, texture, turgor are normal, no rashes or significant lesions EYES: normal, Conjunctiva are pink and non-injected, sclera clear OROPHARYNX:no exudate, no erythema and lips, buccal mucosa, and tongue normal  NECK: supple, thyroid normal size, non-tender, without nodularity LYMPH:  no palpable lymphadenopathy in the cervical, axillary or inguinal LUNGS: clear to auscultation and percussion with normal breathing effort HEART: regular rate & rhythm and no murmurs and no lower extremity edema ABDOMEN:abdomen soft, non-tender and normal bowel sounds MUSCULOSKELETAL:no cyanosis of digits and no clubbing  NEURO: alert & oriented x 3 with fluent speech, no focal motor/sensory deficits EXTREMITIES: No lower extremity edema BREAST: No palpable masses or nodules in either right or left breasts. No palpable axillary supraclavicular or  infraclavicular adenopathy no breast tenderness or nipple discharge. (exam performed in the presence of a chaperone)  LABORATORY DATA:  I have reviewed the data as listed   Chemistry      Component Value Date/Time   NA 140 04/18/2017 0937   K 4.2 04/18/2017 0937   CL 101 11/30/2011 1649   CO2 25 04/18/2017 0937   BUN 11.3 04/18/2017 0937   CREATININE 0.6 04/18/2017 0937      Component Value Date/Time   CALCIUM 9.8 04/18/2017 0937   ALKPHOS 65 04/18/2017 0937   AST 13 04/18/2017 0937   ALT 9 04/18/2017 0937   BILITOT 0.44 04/18/2017 0937       Lab Results  Component Value Date   WBC 3.4 (L) 04/18/2017   HGB 13.3 04/18/2017   HCT 38.3 04/18/2017   MCV 94.3 04/18/2017   PLT 202 04/18/2017   NEUTROABS 1.8 04/18/2017    ASSESSMENT & PLAN:  Malignant neoplasm of upper-inner quadrant of left breast in female, estrogen receptor positive ([facility]) Palpable left breast mass for 2 months, ultrasound at 9:00: 4.7 x 3.6 x 3.3 cm, axilla negative: U/S Bx: Grade 2 IDC ER 95%, PR 50%,  HER-2 positive ratio 6.17, Ki-67 20%,  Right breast biopsy: PASH  03/05/2017 Left lumpectomy: IDC grade 2, 2.5 cm, margins negative, 0/2 lymph nodes negative, ER 100%, PR 50%, HER-2 positive ratio 4.88, T2 N0 stage IB AJCC 8  Treatment summary 1. Neoadjuvant chemotherapy with TCH Perjeta 6 started 10/11/2016 completed 01/24/2017 followed by Herceptin and Perjetamaintenance for 1 year 2.Breast conserving surgery on 03/05/2017 3. Followed by adjuvant radiation therapy 4. Followed by adjuvant antiestrogen therapy ------------------------------------------------------------------------------ Plan: 1. Continue Herceptin and Perjeta maintenance 2. radiation therapy 3. Followed by adjuvant antiestrogen therapy  Return to clinic every 3 weeks for Herceptin Perjeta every 6 weeks for follow-up with me  04/17/2017: 2 groups of suspicious residual calcifications in the left breast one is anterior to lumpectomy  the other is medial aspect of lumpectomy. I discussed extensively with Dr. Donne Hazel. The plan is for her to undergo excision of both of these areas. She'll be seeing Dr. Donne Hazel soon.  Return to clinic in 3 weeks for Herceptin and Perjeta and in 6 weeks for follow-up with me. I spent 25 minutes talking to the patient of which more than half was spent in counseling and coordination of care.  No orders of the defined types were placed in this encounter.  The patient has a good understanding of the overall plan. she agrees with it. she will call with any problems that may develop before the next visit here.   Rulon Eisenmenger, MD 04/18/17

## 2017-04-19 ENCOUNTER — Telehealth: Payer: Self-pay | Admitting: Hematology and Oncology

## 2017-04-19 ENCOUNTER — Encounter (HOSPITAL_BASED_OUTPATIENT_CLINIC_OR_DEPARTMENT_OTHER): Payer: Self-pay | Admitting: *Deleted

## 2017-04-19 ENCOUNTER — Other Ambulatory Visit: Payer: Self-pay | Admitting: General Surgery

## 2017-04-19 DIAGNOSIS — C50012 Malignant neoplasm of nipple and areola, left female breast: Secondary | ICD-10-CM

## 2017-04-19 DIAGNOSIS — Z170 Estrogen receptor positive status [ER+]: Principal | ICD-10-CM

## 2017-04-19 NOTE — Telephone Encounter (Signed)
Per 9/19 no los °

## 2017-04-20 DIAGNOSIS — Z510 Encounter for antineoplastic radiation therapy: Secondary | ICD-10-CM | POA: Diagnosis not present

## 2017-04-23 ENCOUNTER — Ambulatory Visit
Admission: RE | Admit: 2017-04-23 | Discharge: 2017-04-23 | Disposition: A | Discharge status: Home / Self Care | Payer: BLUE CROSS/BLUE SHIELD | Source: Ambulatory Visit | Attending: General Surgery | Admitting: General Surgery

## 2017-04-23 ENCOUNTER — Ambulatory Visit (HOSPITAL_BASED_OUTPATIENT_CLINIC_OR_DEPARTMENT_OTHER): Payer: BLUE CROSS/BLUE SHIELD | Admitting: Anesthesiology

## 2017-04-23 ENCOUNTER — Ambulatory Visit (HOSPITAL_BASED_OUTPATIENT_CLINIC_OR_DEPARTMENT_OTHER)
Admission: RE | Admit: 2017-04-23 | Discharge: 2017-04-23 | Disposition: A | Discharge status: Home / Self Care | Payer: BLUE CROSS/BLUE SHIELD | Source: Ambulatory Visit | Attending: General Surgery | Admitting: General Surgery

## 2017-04-23 ENCOUNTER — Ambulatory Visit
Admit: 2017-04-23 | Discharge: 2017-04-23 | Disposition: A | Discharge status: Home / Self Care | Payer: BLUE CROSS/BLUE SHIELD | Attending: General Surgery | Admitting: General Surgery

## 2017-04-23 ENCOUNTER — Encounter (HOSPITAL_BASED_OUTPATIENT_CLINIC_OR_DEPARTMENT_OTHER)
Admission: RE | Disposition: A | Discharge status: Home / Self Care | Payer: Self-pay | Source: Ambulatory Visit | Attending: General Surgery

## 2017-04-23 ENCOUNTER — Encounter (HOSPITAL_BASED_OUTPATIENT_CLINIC_OR_DEPARTMENT_OTHER): Payer: Self-pay | Admitting: Emergency Medicine

## 2017-04-23 DIAGNOSIS — Z9221 Personal history of antineoplastic chemotherapy: Secondary | ICD-10-CM | POA: Diagnosis not present

## 2017-04-23 DIAGNOSIS — C50012 Malignant neoplasm of nipple and areola, left female breast: Secondary | ICD-10-CM

## 2017-04-23 DIAGNOSIS — C50912 Malignant neoplasm of unspecified site of left female breast: Secondary | ICD-10-CM | POA: Insufficient documentation

## 2017-04-23 DIAGNOSIS — N6022 Fibroadenosis of left breast: Secondary | ICD-10-CM | POA: Diagnosis not present

## 2017-04-23 DIAGNOSIS — Z170 Estrogen receptor positive status [ER+]: Principal | ICD-10-CM

## 2017-04-23 DIAGNOSIS — Z87891 Personal history of nicotine dependence: Secondary | ICD-10-CM | POA: Diagnosis not present

## 2017-04-23 DIAGNOSIS — Z885 Allergy status to narcotic agent status: Secondary | ICD-10-CM | POA: Diagnosis not present

## 2017-04-23 HISTORY — PX: BREAST LUMPECTOMY WITH RADIOACTIVE SEED LOCALIZATION: SHX6424

## 2017-04-23 HISTORY — PX: BREAST EXCISIONAL BIOPSY: SUR124

## 2017-04-23 SURGERY — BREAST LUMPECTOMY WITH RADIOACTIVE SEED LOCALIZATION
Anesthesia: General | Site: Breast | Laterality: Left

## 2017-04-23 MED ORDER — MIDAZOLAM HCL 2 MG/2ML IJ SOLN
INTRAMUSCULAR | Status: AC
  Filled 2017-04-23: qty 2

## 2017-04-23 MED ORDER — PHENYLEPHRINE 40 MCG/ML (10ML) SYRINGE FOR IV PUSH (FOR BLOOD PRESSURE SUPPORT)
PREFILLED_SYRINGE | INTRAVENOUS | Status: AC
  Filled 2017-04-23: qty 10

## 2017-04-23 MED ORDER — MIDAZOLAM HCL 5 MG/5ML IJ SOLN
INTRAMUSCULAR | Status: DC | PRN
  Administered 2017-04-23: 2 mg via INTRAVENOUS

## 2017-04-23 MED ORDER — CEFAZOLIN SODIUM-DEXTROSE 2-4 GM/100ML-% IV SOLN
2.0000 g | INTRAVENOUS | Status: AC
  Administered 2017-04-23: 2 g via INTRAVENOUS

## 2017-04-23 MED ORDER — DEXAMETHASONE SODIUM PHOSPHATE 4 MG/ML IJ SOLN
INTRAMUSCULAR | Status: DC | PRN
  Administered 2017-04-23: 10 mg via INTRAVENOUS

## 2017-04-23 MED ORDER — FENTANYL CITRATE (PF) 100 MCG/2ML IJ SOLN
INTRAMUSCULAR | Status: AC
  Filled 2017-04-23: qty 2

## 2017-04-23 MED ORDER — ONDANSETRON HCL 4 MG/2ML IJ SOLN
INTRAMUSCULAR | Status: DC | PRN
  Administered 2017-04-23: 4 mg via INTRAVENOUS

## 2017-04-23 MED ORDER — LIDOCAINE 2% (20 MG/ML) 5 ML SYRINGE
INTRAMUSCULAR | Status: AC
  Filled 2017-04-23: qty 5

## 2017-04-23 MED ORDER — DEXAMETHASONE SODIUM PHOSPHATE 10 MG/ML IJ SOLN
INTRAMUSCULAR | Status: AC
  Filled 2017-04-23: qty 1

## 2017-04-23 MED ORDER — OXYCODONE HCL 5 MG PO TABS
ORAL_TABLET | ORAL | Status: AC
  Filled 2017-04-23: qty 1

## 2017-04-23 MED ORDER — ONDANSETRON HCL 4 MG/2ML IJ SOLN
INTRAMUSCULAR | Status: AC
  Filled 2017-04-23: qty 2

## 2017-04-23 MED ORDER — PROPOFOL 10 MG/ML IV BOLUS
INTRAVENOUS | Status: AC
  Filled 2017-04-23: qty 20

## 2017-04-23 MED ORDER — EPHEDRINE SULFATE 50 MG/ML IJ SOLN
INTRAMUSCULAR | Status: DC | PRN
  Administered 2017-04-23: 10 mg via INTRAVENOUS

## 2017-04-23 MED ORDER — CEFAZOLIN SODIUM-DEXTROSE 2-4 GM/100ML-% IV SOLN
INTRAVENOUS | Status: AC
  Filled 2017-04-23: qty 100

## 2017-04-23 MED ORDER — CELECOXIB 200 MG PO CAPS
ORAL_CAPSULE | ORAL | Status: AC
  Filled 2017-04-23: qty 1

## 2017-04-23 MED ORDER — LIDOCAINE HCL (CARDIAC) 20 MG/ML IV SOLN
INTRAVENOUS | Status: DC | PRN
  Administered 2017-04-23: 10 mg via INTRAVENOUS

## 2017-04-23 MED ORDER — FENTANYL CITRATE (PF) 100 MCG/2ML IJ SOLN: 50.0000 ug | INTRAMUSCULAR | Status: DC | PRN

## 2017-04-23 MED ORDER — SCOPOLAMINE 1 MG/3DAYS TD PT72: 1.0000 | MEDICATED_PATCH | Freq: Once | TRANSDERMAL | Status: DC | PRN

## 2017-04-23 MED ORDER — GABAPENTIN 300 MG PO CAPS
ORAL_CAPSULE | ORAL | Status: AC
  Filled 2017-04-23: qty 1

## 2017-04-23 MED ORDER — FENTANYL CITRATE (PF) 100 MCG/2ML IJ SOLN
25.0000 ug | INTRAMUSCULAR | Status: DC | PRN
  Administered 2017-04-23: 50 ug via INTRAVENOUS
  Administered 2017-04-23 (×2): 25 ug via INTRAVENOUS

## 2017-04-23 MED ORDER — LACTATED RINGERS IV SOLN
INTRAVENOUS | Status: DC
  Administered 2017-04-23 (×2): via INTRAVENOUS

## 2017-04-23 MED ORDER — ACETAMINOPHEN 500 MG PO TABS
1000.0000 mg | ORAL_TABLET | ORAL | Status: AC
  Administered 2017-04-23: 1000 mg via ORAL

## 2017-04-23 MED ORDER — PROPOFOL 10 MG/ML IV BOLUS
INTRAVENOUS | Status: DC | PRN
  Administered 2017-04-23: 150 mg via INTRAVENOUS

## 2017-04-23 MED ORDER — PROMETHAZINE HCL 25 MG/ML IJ SOLN: 6.2500 mg | INTRAMUSCULAR | Status: DC | PRN

## 2017-04-23 MED ORDER — BUPIVACAINE HCL (PF) 0.25 % IJ SOLN
INTRAMUSCULAR | Status: DC | PRN
  Administered 2017-04-23: 10 mL

## 2017-04-23 MED ORDER — OXYCODONE HCL 5 MG PO TABS
5.0000 mg | ORAL_TABLET | Freq: Once | ORAL | Status: AC | PRN
  Administered 2017-04-23: 5 mg via ORAL

## 2017-04-23 MED ORDER — GABAPENTIN 300 MG PO CAPS
300.0000 mg | ORAL_CAPSULE | ORAL | Status: AC
  Administered 2017-04-23: 300 mg via ORAL

## 2017-04-23 MED ORDER — FENTANYL CITRATE (PF) 100 MCG/2ML IJ SOLN
INTRAMUSCULAR | Status: DC | PRN
  Administered 2017-04-23: 50 ug via INTRAVENOUS

## 2017-04-23 MED ORDER — MIDAZOLAM HCL 2 MG/2ML IJ SOLN: 1.0000 mg | INTRAMUSCULAR | Status: DC | PRN

## 2017-04-23 MED ORDER — KETOROLAC TROMETHAMINE 30 MG/ML IJ SOLN: 30.0000 mg | Freq: Once | INTRAMUSCULAR | Status: DC | PRN

## 2017-04-23 MED ORDER — CELECOXIB 200 MG PO CAPS
200.0000 mg | ORAL_CAPSULE | ORAL | Status: AC
  Administered 2017-04-23: 200 mg via ORAL

## 2017-04-23 MED ORDER — ACETAMINOPHEN 500 MG PO TABS
ORAL_TABLET | ORAL | Status: AC
  Filled 2017-04-23: qty 2

## 2017-04-23 SURGICAL SUPPLY — 58 items
APPLIER CLIP 9.375 MED OPEN (MISCELLANEOUS) ×2
BINDER BREAST LRG (GAUZE/BANDAGES/DRESSINGS) IMPLANT
BINDER BREAST MEDIUM (GAUZE/BANDAGES/DRESSINGS) ×2 IMPLANT
BINDER BREAST XLRG (GAUZE/BANDAGES/DRESSINGS) IMPLANT
BINDER BREAST XXLRG (GAUZE/BANDAGES/DRESSINGS) IMPLANT
BLADE SURG 15 STRL LF DISP TIS (BLADE) ×1 IMPLANT
BLADE SURG 15 STRL SS (BLADE) ×1
CANISTER SUC SOCK COL 7IN (MISCELLANEOUS) IMPLANT
CANISTER SUCT 1200ML W/VALVE (MISCELLANEOUS) ×2 IMPLANT
CHLORAPREP W/TINT 26ML (MISCELLANEOUS) ×2 IMPLANT
CLIP APPLIE 9.375 MED OPEN (MISCELLANEOUS) ×1 IMPLANT
CLIP VESOCCLUDE SM WIDE 6/CT (CLIP) IMPLANT
COVER BACK TABLE 60X90IN (DRAPES) ×2 IMPLANT
COVER MAYO STAND STRL (DRAPES) ×2 IMPLANT
COVER PROBE W GEL 5X96 (DRAPES) IMPLANT
DECANTER SPIKE VIAL GLASS SM (MISCELLANEOUS) IMPLANT
DERMABOND ADVANCED (GAUZE/BANDAGES/DRESSINGS) ×1
DERMABOND ADVANCED .7 DNX12 (GAUZE/BANDAGES/DRESSINGS) ×1 IMPLANT
DEVICE DUBIN W/COMP PLATE 8390 (MISCELLANEOUS) ×4 IMPLANT
DRAPE LAPAROSCOPIC ABDOMINAL (DRAPES) ×2 IMPLANT
DRAPE UTILITY XL STRL (DRAPES) ×2 IMPLANT
DRSG TEGADERM 4X4.75 (GAUZE/BANDAGES/DRESSINGS) IMPLANT
ELECT COATED BLADE 2.86 ST (ELECTRODE) ×2 IMPLANT
ELECT REM PT RETURN 9FT ADLT (ELECTROSURGICAL) ×2
ELECTRODE REM PT RTRN 9FT ADLT (ELECTROSURGICAL) ×1 IMPLANT
GAUZE SPONGE 4X4 12PLY STRL LF (GAUZE/BANDAGES/DRESSINGS) IMPLANT
GLOVE BIO SURGEON STRL SZ7 (GLOVE) ×4 IMPLANT
GLOVE BIOGEL PI IND STRL 7.0 (GLOVE) ×1 IMPLANT
GLOVE BIOGEL PI IND STRL 7.5 (GLOVE) ×2 IMPLANT
GLOVE BIOGEL PI INDICATOR 7.0 (GLOVE) ×1
GLOVE BIOGEL PI INDICATOR 7.5 (GLOVE) ×2
GLOVE SURG SS PI 6.5 STRL IVOR (GLOVE) ×2 IMPLANT
GOWN STRL REUS W/ TWL LRG LVL3 (GOWN DISPOSABLE) ×2 IMPLANT
GOWN STRL REUS W/TWL LRG LVL3 (GOWN DISPOSABLE) ×2
HEMOSTAT ARISTA ABSORB 3G PWDR (MISCELLANEOUS) ×2 IMPLANT
ILLUMINATOR WAVEGUIDE N/F (MISCELLANEOUS) IMPLANT
KIT MARKER MARGIN INK (KITS) ×2 IMPLANT
LIGHT WAVEGUIDE WIDE FLAT (MISCELLANEOUS) IMPLANT
NEEDLE HYPO 25X1 1.5 SAFETY (NEEDLE) ×2 IMPLANT
NS IRRIG 1000ML POUR BTL (IV SOLUTION) IMPLANT
PACK BASIN DAY SURGERY FS (CUSTOM PROCEDURE TRAY) ×2 IMPLANT
PENCIL BUTTON HOLSTER BLD 10FT (ELECTRODE) ×2 IMPLANT
SLEEVE SCD COMPRESS KNEE MED (MISCELLANEOUS) ×2 IMPLANT
SPONGE LAP 4X18 X RAY DECT (DISPOSABLE) ×2 IMPLANT
STRIP CLOSURE SKIN 1/2X4 (GAUZE/BANDAGES/DRESSINGS) ×2 IMPLANT
SUT MNCRL AB 4-0 PS2 18 (SUTURE) ×2 IMPLANT
SUT MON AB 5-0 PS2 18 (SUTURE) ×2 IMPLANT
SUT SILK 2 0 SH (SUTURE) ×2 IMPLANT
SUT VIC AB 2-0 SH 27 (SUTURE) ×1
SUT VIC AB 2-0 SH 27XBRD (SUTURE) ×1 IMPLANT
SUT VIC AB 3-0 SH 27 (SUTURE) ×2
SUT VIC AB 3-0 SH 27X BRD (SUTURE) ×2 IMPLANT
SUT VIC AB 5-0 PS2 18 (SUTURE) IMPLANT
SYR CONTROL 10ML LL (SYRINGE) ×2 IMPLANT
TOWEL OR 17X24 6PK STRL BLUE (TOWEL DISPOSABLE) ×2 IMPLANT
TOWEL OR NON WOVEN STRL DISP B (DISPOSABLE) ×2 IMPLANT
TUBE CONNECTING 20X1/4 (TUBING) ×2 IMPLANT
YANKAUER SUCT BULB TIP NO VENT (SUCTIONS) ×2 IMPLANT

## 2017-04-23 NOTE — Discharge Instructions (Signed)
[facility] °Office Phone Number [phone] ° °POST OP INSTRUCTIONS ° °Always review your discharge instruction sheet given to you by the facility where your surgery was performed. ° °IF YOU HAVE DISABILITY OR FAMILY LEAVE FORMS, YOU MUST BRING THEM TO THE OFFICE FOR PROCESSING.  DO NOT GIVE THEM TO YOUR DOCTOR. ° °1. A prescription for pain medication may be given to you upon discharge.  Take your pain medication as prescribed, if needed.  If narcotic pain medicine is not needed, then you may take acetaminophen (Tylenol), naprosyn (Alleve) or ibuprofen (Advil) as needed. °2. Take your usually prescribed medications unless otherwise directed °3. If you need a refill on your pain medication, please contact your pharmacy.  They will contact our office to request authorization.  Prescriptions will not be filled after 5pm or on week-ends. °4. You should eat very light the first 24 hours after surgery, such as soup, crackers, pudding, etc.  Resume your normal diet the day after surgery. °5. Most patients will experience some swelling and bruising in the breast.  Ice packs and a good support bra will help.  Wear the breast binder provided or a sports bra for 72 hours day and night.  After that wear a sports bra during the day until you return to the office. Swelling and bruising can take several days to resolve.  °6. It is common to experience some constipation if taking pain medication after surgery.  Increasing fluid intake and taking a stool softener will usually help or prevent this problem from occurring.  A mild laxative (Milk of Magnesia or Miralax) should be taken according to package directions if there are no bowel movements after 48 hours. °7. Unless discharge instructions indicate otherwise, you may remove your bandages 48 hours after surgery and you may shower at that time.  You may have steri-strips (small skin tapes) in place directly over the incision.  These strips should be left on the  skin for 7-10 days and will come off on their own.  If your surgeon used skin glue on the incision, you may shower in 24 hours.  The glue will flake off over the next 2-3 weeks.  Any sutures or staples will be removed at the office during your follow-up visit. °8. ACTIVITIES:  You may resume regular daily activities (gradually increasing) beginning the next day.  Wearing a good support bra or sports bra minimizes pain and swelling.  You may have sexual intercourse when it is comfortable. °a. You may drive when you no longer are taking prescription pain medication, you can comfortably wear a seatbelt, and you can safely maneuver your car and apply brakes. °b. RETURN TO WORK:  ______________________________________________________________________________________ °9. You should see your doctor in the office for a follow-up appointment approximately two weeks after your surgery.  Your doctor’s nurse will typically make your follow-up appointment when she calls you with your pathology report.  Expect your pathology report 3-4 business days after your surgery.  You may call to check if you do not hear from us after three days. °10. OTHER INSTRUCTIONS: _______________________________________________________________________________________________ _____________________________________________________________________________________________________________________________________ °_____________________________________________________________________________________________________________________________________ °_____________________________________________________________________________________________________________________________________ ° °WHEN TO CALL DR WAKEFIELD: °1. Fever over 101.0 °2. Nausea and/or vomiting. °3. Extreme swelling or bruising. °4. Continued bleeding from incision. °5. Increased pain, redness, or drainage from the incision. ° °The clinic staff is available to answer your questions during regular  business hours.  Please don’t hesitate to call and ask to speak to one of the nurses for clinical concerns.  If   you have a medical emergency, go to the nearest emergency room or call 911.  A surgeon from [facility] is always on call at the hospital. ° °For further questions, please visit [email or web address] ° ° ° ° ° °Post Anesthesia Home Care Instructions ° °Activity: °Get plenty of rest for the remainder of the day. A responsible individual must stay with you for 24 hours following the procedure.  °For the next 24 hours, DO NOT: °-Drive a car °-Operate machinery °-Drink alcoholic beverages °-Take any medication unless instructed by your physician °-Make any legal decisions or sign important papers. ° °Meals: °Start with liquid foods such as gelatin or soup. Progress to regular foods as tolerated. Avoid greasy, spicy, heavy foods. If nausea and/or vomiting occur, drink only clear liquids until the nausea and/or vomiting subsides. Call your physician if vomiting continues. ° °Special Instructions/Symptoms: °Your throat may feel dry or sore from the anesthesia or the breathing tube placed in your throat during surgery. If this causes discomfort, gargle with warm salt water. The discomfort should disappear within 24 hours. ° °If you had a scopolamine patch placed behind your ear for the management of post- operative nausea and/or vomiting: ° °1. The medication in the patch is effective for 72 hours, after which it should be removed.  Wrap patch in a tissue and discard in the trash. Wash hands thoroughly with soap and water. °2. You may remove the patch earlier than 72 hours if you experience unpleasant side effects which may include dry mouth, dizziness or visual disturbances. °3. Avoid touching the patch. Wash your hands with soap and water after contact with the patch. °  ° °

## 2017-04-23 NOTE — Anesthesia Procedure Notes (Signed)
Procedure Name: LMA Insertion Date/Time: 04/23/2017 12:54 PM Performed by: Toula Moos L Pre-anesthesia Checklist: Patient identified, Emergency Drugs available, Suction available, Patient being monitored and Timeout performed Patient Re-evaluated:Patient Re-evaluated prior to induction Oxygen Delivery Method: Circle system utilized Preoxygenation: Pre-oxygenation with 100% oxygen Induction Type: IV induction Ventilation: Mask ventilation without difficulty LMA: LMA inserted LMA Size: 4.0 Number of attempts: 1 Airway Equipment and Method: Bite block Placement Confirmation: positive ETCO2 Tube secured with: Tape Dental Injury: Teeth and Oropharynx as per pre-operative assessment

## 2017-04-23 NOTE — Brief Op Note (Signed)
04/23/2017  1:37 PM  PATIENT:  Miranda Woods  61 y.o. female  PRE-OPERATIVE DIAGNOSIS:  LEFT BREAST CANCER  POST-OPERATIVE DIAGNOSIS:  LEFT BREAST CANCER  PROCEDURE:  Procedure(s): RADIOACTIVE SEED X'S 2 EXCISION OF LEFT BREAST MARGINS (Left)  SURGEON:  Surgeon(s) and Role:    * Rolm Bookbinder, MD - Primary  PHYSICIAN ASSISTANT:   ASSISTANTS: none   ANESTHESIA:   general  EBL:  Total I/O In: 1000 [I.V.:1000] Out: -   BLOOD ADMINISTERED:none  DRAINS: none   LOCAL MEDICATIONS USED:  MARCAINE     SPECIMEN:  Excision  DISPOSITION OF SPECIMEN:  PATHOLOGY  COUNTS:  YES  TOURNIQUET:  * No tourniquets in log *  DICTATION: .Dragon Dictation  PLAN OF CARE: Discharge to home after PACU  PATIENT DISPOSITION:  PACU - hemodynamically stable.   Delay start of Pharmacological VTE agent (>24hrs) due to surgical blood loss or risk of bleeding: not applicable

## 2017-04-23 NOTE — Anesthesia Postprocedure Evaluation (Signed)
Anesthesia Post Note  Patient: Miranda Woods  Procedure(s) Performed: Procedure(s) (LRB): EXCISION OF LEFT BREAST SEED LOCALIZED MARGINS (TWO SEEDS) (Left)     Patient location during evaluation: PACU Anesthesia Type: General Level of consciousness: awake and alert Pain management: pain level controlled Vital Signs Assessment: post-procedure vital signs reviewed and stable Respiratory status: spontaneous breathing, nonlabored ventilation, respiratory function stable and patient connected to nasal cannula oxygen Cardiovascular status: blood pressure returned to baseline and stable Postop Assessment: no apparent nausea or vomiting Anesthetic complications: no    Last Vitals:  Vitals:   04/23/17 1345 04/23/17 1400  BP: (!) 148/92 133/83  Pulse: 72 (!) 54  Resp: 14 12  Temp:  36.5 C  SpO2: 100% 100%    Last Pain:  Vitals:   04/23/17 1400  TempSrc:   PainSc: 8                  Corrinne Benegas S

## 2017-04-23 NOTE — Op Note (Signed)
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 Left medial margin marked with paint, seed contained as well as medial clip 2. Radioactive seed  3. Left anterolateral margin marked with paint, two clips in place from prior surgery Complications none Drains none Sponge count correct Dispo to pacu stable  Indications: This is a 71 yof who had completed neoadjuvant therapy and then underwent lumpectomy and sentinel node biopsy.  Her margins are clear but due to volume of calcifications preop I sent her for postop mammogram. This shows small areas of calcifications at two margins and I discussed excision with seed guidance. She had two seeds placed prior to beginning. I had the mammograms in the OR.   Procedure: After informed consent was obtained the patient was taken to the operating room. She was given antibiotics. Sequential compression devices were on her legs. She was then placed under general anesthesia with an LMA. Then she was prepped and draped in the standard sterile surgical fashion. Surgical timeout was then performed.  I located the two seeds. I infiltrated marcaine throughout this area.  I rentered the old incision.  I opened up the cavity. I located the first seed at the medial margin and excised this margin.  Mammogram showed the medial clip and the seed were in place. The other seed was at the anterolateral margin.  This was just sitting on the tissue and I removed it separately. I then excised this margin. Both were marked with paint.  I then obtained hemostasis. I did place some arista in the cavity.   I closed the breast with 2-0 vicryl. The skin was closed with 3-0 vicryl and 5-0 monocryl. Glue  and steristrips were applied. She was extubated and transferred to PACU.

## 2017-04-23 NOTE — Transfer of Care (Signed)
Immediate Anesthesia Transfer of Care Note  Patient: Miranda Woods  Procedure(s) Performed: Procedure(s): EXCISION OF LEFT BREAST SEED LOCALIZED MARGINS (TWO SEEDS) (Left)  Patient Location: PACU  Anesthesia Type:General  Level of Consciousness: awake  Airway & Oxygen Therapy: Patient Spontanous Breathing and Patient connected to face mask oxygen  Post-op Assessment: Report given to RN and Post -op Vital signs reviewed and stable  Post vital signs: Reviewed and stable  Last Vitals:  Vitals:   04/23/17 1206  BP: 109/67  Pulse: 67  Resp: 16  Temp: 36.6 C  SpO2: 100%    Last Pain:  Vitals:   04/23/17 1206  TempSrc: Oral  PainSc: 0-No pain         Complications: No apparent anesthesia complications

## 2017-04-23 NOTE — H&P (Signed)
Miranda Woods is an 61 y.o. female.   Chief Complaint: left breast cancer HPI:  48 yof here for second opinion on left breast cancer. She palpated left breast mass that was evaluated. Density is d. she had a 4.3 cm mass in inner left breast with calcs. US shows a mass at 9 oclock 1 cm from nipple that measures 4.7x3.3x3.6 cm There is no axillary adenopathy. 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 She had mri also that shows two areas in right breast that have subsequently biopsied and are benign and concordant. The left side has a 3.4x3.4x3.5 cm mass with anbl thick cortex node that on f/u US is normal appearing. she had port placed for primary chemo and has now completed her chemo June 27. she has a repeat mri that shows a 2.4x2.7x2.3 cm mass and negative nodes. she udnerwent lumpectomy/sn with negative nodes and negative margins with a  Couple that were close. Due to large volume of calcs I sent for postop mammo and she has two small areas of calcifications. I recommended excising these.     Past Medical History:  Diagnosis Date  . ADHD (attention deficit hyperactivity disorder)   . Breast cancer (Miranda Woods)    left 2018  . Cancer (Miranda Woods) 12/2016   left breast cancer  . Fibrocystic breast disease   . Personal history of chemotherapy     Past Surgical History:  Procedure Laterality Date  . ANKLE FRACTURE SURGERY Left   . APPENDECTOMY    . BREAST BIOPSY    . BREAST LUMPECTOMY Left    2018  . BREAST LUMPECTOMY WITH RADIOACTIVE SEED AND SENTINEL LYMPH NODE BIOPSY Left 03/05/2017   Procedure: LEFT BREAST LUMPECTOMY WITH RADIOACTIVE SEED AND SENTINEL LYMPH NODE BIOPSY AND BLUE DYE INJECTION;  Surgeon: Miranda Bookbinder, MD;  Location: [facility];  Service: General;  Laterality: Left;  . HUMERUS FRACTURE SURGERY Left   . IR GENERIC HISTORICAL  10/10/2016   IR US GUIDE VASC ACCESS RIGHT 10/10/2016 Miranda Cadet, MD [facility]  . IR GENERIC  HISTORICAL  10/10/2016   IR FLUORO GUIDE PORT INSERTION RIGHT 10/10/2016 Miranda Cadet, MD [facility]  . ORIF WRIST FRACTURE    . TEMPOROMANDIBULAR JOINT SURGERY  1986    Family History  Problem Relation Age of Onset  . Heart disease Father   . Hypertension Father   . Thyroid disease Mother    Social History:  reports that she has quit smoking. Her smoking use included Cigarettes. She has never used smokeless tobacco. She reports that she does not drink alcohol or use drugs.  Allergies:  Allergies  Allergen Reactions  . Codeine Hives and Itching    Tolerates hydrocodone    Medications Prior to Admission  Medication Sig Dispense Refill  . Eszopiclone 3 MG TABS Take 1 tablet (3 mg total) by mouth at bedtime as needed for sleep. Take immediately before bedtime 30 tablet 5  . UNABLE TO FIND Med Name: Tonic Alcemy. One scoop every morning    . triamcinolone cream (KENALOG) 0.5 % Apply 1 application topically 2 (two) times daily. 30 g 0    No results found for this or any previous visit (from the past 48 hour(s)). Mm Breast Surgical Specimen  Result Date: 04/23/2017 CLINICAL DATA:  The patient presents for seed localization prior to lumpectomy for calcifications in the left breast. Recent lumpectomy for breast cancer. EXAM: MAMMOGRAPHIC GUIDED RADIOACTIVE SEED LOCALIZATION OF  THE LEFT BREAST x2 COMPARISON:  Previous exam(s). FINDINGS: Patient presents for radioactive seed localization prior to excision of left breast calcifications. I met with the patient and we discussed the procedure of seed localization including benefits and alternatives. We discussed the high likelihood of a successful procedure. We discussed the risks of the procedure including infection, bleeding, tissue injury and further surgery. We discussed the low dose of radioactivity involved in the procedure. Informed, written consent was given. The usual time-out protocol was performed immediately prior to the procedure.  Using mammographic guidance, sterile technique, 1% lidocaine and an I-125 radioactive seed, the posteromedial most surgical clip was localized using a medial approach. The follow-up mammogram images confirm the seed in the expected location and were marked for Dr. Donne Woods. Follow-up survey of the patient confirms presence of the radioactive seed. Order number of I-125 seed:  [id number]. Total activity:  9.390 millicuries  Reference Date: 04/17/2017 Using mammographic guidance, sterile technique, 1% lidocaine and an I-125 radioactive seed, a group of anterior retroareolar calcifications was localized using a medial approach. The follow-up mammogram images confirm the seed in the expected location and were marked for Dr. Donne Woods. Follow-up survey of the patient confirms presence of the radioactive seed. Order number of I-125 seed:  [id number]. Total activity:  7.622 millicuries  Reference Date: 04/17/2017 The patient tolerated the procedure well and was released from the [facility]. She was given instructions regarding seed removal. IMPRESSION: Radioactive seed localization of 2 sites in the left breast. No apparent complications. Electronically Signed   By: Miranda Woods M.D.   On: 04/23/2017 10:23   Mm Lt Rad Seed Ea Add Lesion Loc Mammo  Result Date: 04/23/2017 CLINICAL DATA:  The patient presents for seed localization prior to lumpectomy for calcifications in the left breast. Recent lumpectomy for breast cancer. EXAM: MAMMOGRAPHIC GUIDED RADIOACTIVE SEED LOCALIZATION OF THE LEFT BREAST x2 COMPARISON:  Previous exam(s). FINDINGS: Patient presents for radioactive seed localization prior to excision of left breast calcifications. I met with the patient and we discussed the procedure of seed localization including benefits and alternatives. We discussed the high likelihood of a successful procedure. We discussed the risks of the procedure including infection, bleeding, tissue injury and further surgery. We  discussed the low dose of radioactivity involved in the procedure. Informed, written consent was given. The usual time-out protocol was performed immediately prior to the procedure. Using mammographic guidance, sterile technique, 1% lidocaine and an I-125 radioactive seed, the posteromedial most surgical clip was localized using a medial approach. The follow-up mammogram images confirm the seed in the expected location and were marked for Dr. Donne Woods. Follow-up survey of the patient confirms presence of the radioactive seed. Order number of I-125 seed:  [id number]. Total activity:  5.638 millicuries  Reference Date: 04/17/2017 Using mammographic guidance, sterile technique, 1% lidocaine and an I-125 radioactive seed, a group of anterior retroareolar calcifications was localized using a medial approach. The follow-up mammogram images confirm the seed in the expected location and were marked for Dr. Donne Woods. Follow-up survey of the patient confirms presence of the radioactive seed. Order number of I-125 seed:  [id number]. Total activity:  8.115 millicuries  Reference Date: 04/17/2017 The patient tolerated the procedure well and was released from the [facility]. She was given instructions regarding seed removal. IMPRESSION: Radioactive seed localization of 2 sites in the left breast. No apparent complications. Electronically Signed   By: Miranda Woods M.D.   On: 04/23/2017 10:23    Review of Systems  All other systems reviewed and are negative.   Blood pressure 109/67, pulse 67, temperature 97.9 F (36.6 C), temperature source Oral, resp. rate 16, height _0  (1.651 m), weight 52.6 kg (116 lb), last menstrual period 11/28/2011, SpO2 100 %. Physical Exam  General Mental Status-Alert. Orientation-Oriented X3 Chest and Lung Exam Chest and lung exam reveals -on auscultation, normal breath sounds, no adventitious sounds and normal vocal resonance. Breast Nipples-No Discharge. Note: left  breast and axillary incisions clean without infection Cardiovascular Cardiovascular examination reveals -normal heart sounds, regular rate and rhythm with no murmurs.  Assessment/Plan Seed guided margin excision times two  Carlie Solorzano, MD 04/23/2017, 12:28 PM

## 2017-04-23 NOTE — Interval H&P Note (Signed)
History and Physical Interval Note:  04/23/2017 12:30 PM  Miranda Woods  has presented today for surgery, with the diagnosis of LEFT BREAST CANCER  The various methods of treatment have been discussed with the patient and family. After consideration of risks, benefits and other options for treatment, the patient has consented to  Procedure(s): RADIOACTIVE SEED X'S 2 EXCISION OF LEFT BREAST MARGINS (Left) as a surgical intervention .  The patient's history has been reviewed, patient examined, no change in status, stable for surgery.  I have reviewed the patient's chart and labs.  Questions were answered to the patient's satisfaction.     Dejanay Wamboldt

## 2017-04-23 NOTE — Anesthesia Preprocedure Evaluation (Signed)
Anesthesia Evaluation  Patient identified by MRN, date of birth, ID band Patient awake    Reviewed: Allergy & Precautions, NPO status , Patient's Chart, lab work & pertinent test results  Airway Mallampati: II  TM Distance: >3 FB Neck ROM: Full    Dental no notable dental hx.    Pulmonary neg pulmonary ROS, former smoker,    Pulmonary exam normal breath sounds clear to auscultation       Cardiovascular negative cardio ROS Normal cardiovascular exam Rhythm:Regular Rate:Normal     Neuro/Psych negative neurological ROS  negative psych ROS   GI/Hepatic negative GI ROS, Neg liver ROS,   Endo/Other  negative endocrine ROS  Renal/GU negative Renal ROS  negative genitourinary   Musculoskeletal negative musculoskeletal ROS (+)   Abdominal   Peds negative pediatric ROS (+)  Hematology negative hematology ROS (+)   Anesthesia Other Findings   Reproductive/Obstetrics negative OB ROS                             Anesthesia Physical Anesthesia Plan  ASA: II  Anesthesia Plan: General   Post-op Pain Management:    Induction: Intravenous  PONV Risk Score and Plan: 2 and Ondansetron and Dexamethasone  Airway Management Planned: LMA  Additional Equipment:   Intra-op Plan:   Post-operative Plan: Extubation in OR  Informed Consent: I have reviewed the patients History and Physical, chart, labs and discussed the procedure including the risks, benefits and alternatives for the proposed anesthesia with the patient or authorized representative who has indicated his/her understanding and acceptance.   Dental advisory given  Plan Discussed with: CRNA and Surgeon  Anesthesia Plan Comments:         Anesthesia Quick Evaluation

## 2017-04-24 ENCOUNTER — Encounter (HOSPITAL_COMMUNITY): Payer: Self-pay | Admitting: Vascular Surgery

## 2017-04-24 ENCOUNTER — Ambulatory Visit: Payer: BLUE CROSS/BLUE SHIELD | Admitting: Radiation Oncology

## 2017-04-24 ENCOUNTER — Telehealth (HOSPITAL_COMMUNITY): Payer: Self-pay | Admitting: Vascular Surgery

## 2017-04-24 ENCOUNTER — Telehealth: Payer: Self-pay | Admitting: Hematology and Oncology

## 2017-04-24 ENCOUNTER — Encounter (HOSPITAL_BASED_OUTPATIENT_CLINIC_OR_DEPARTMENT_OTHER): Payer: Self-pay | Admitting: General Surgery

## 2017-04-24 NOTE — Telephone Encounter (Signed)
Pt mailbox is full will send letter

## 2017-04-24 NOTE — Telephone Encounter (Signed)
Attempted to leave voicemail for patient regarding her appts that were added per 9/20 sch msg. Sending her a confirmation letter.

## 2017-04-25 ENCOUNTER — Ambulatory Visit: Payer: BLUE CROSS/BLUE SHIELD

## 2017-04-26 ENCOUNTER — Ambulatory Visit: Payer: BLUE CROSS/BLUE SHIELD

## 2017-04-27 ENCOUNTER — Ambulatory Visit: Payer: BLUE CROSS/BLUE SHIELD

## 2017-04-30 ENCOUNTER — Ambulatory Visit: Payer: BLUE CROSS/BLUE SHIELD

## 2017-05-01 ENCOUNTER — Ambulatory Visit: Payer: BLUE CROSS/BLUE SHIELD

## 2017-05-02 ENCOUNTER — Ambulatory Visit: Payer: BLUE CROSS/BLUE SHIELD

## 2017-05-03 ENCOUNTER — Ambulatory Visit: Payer: BLUE CROSS/BLUE SHIELD

## 2017-05-04 ENCOUNTER — Ambulatory Visit: Payer: BLUE CROSS/BLUE SHIELD

## 2017-05-07 ENCOUNTER — Ambulatory Visit: Payer: BLUE CROSS/BLUE SHIELD

## 2017-05-08 ENCOUNTER — Ambulatory Visit: Payer: BLUE CROSS/BLUE SHIELD

## 2017-05-09 ENCOUNTER — Other Ambulatory Visit: Payer: Self-pay

## 2017-05-09 ENCOUNTER — Ambulatory Visit: Payer: BLUE CROSS/BLUE SHIELD

## 2017-05-09 ENCOUNTER — Ambulatory Visit (HOSPITAL_BASED_OUTPATIENT_CLINIC_OR_DEPARTMENT_OTHER): Payer: BLUE CROSS/BLUE SHIELD

## 2017-05-09 VITALS — BP 116/87 | HR 71 | Temp 98.2°F | Resp 18

## 2017-05-09 DIAGNOSIS — Z5181 Encounter for therapeutic drug level monitoring: Secondary | ICD-10-CM

## 2017-05-09 DIAGNOSIS — C50212 Malignant neoplasm of upper-inner quadrant of left female breast: Secondary | ICD-10-CM

## 2017-05-09 DIAGNOSIS — Z170 Estrogen receptor positive status [ER+]: Principal | ICD-10-CM

## 2017-05-09 DIAGNOSIS — Z5112 Encounter for antineoplastic immunotherapy: Secondary | ICD-10-CM | POA: Diagnosis not present

## 2017-05-09 DIAGNOSIS — Z79899 Other long term (current) drug therapy: Secondary | ICD-10-CM

## 2017-05-09 MED ORDER — TRASTUZUMAB CHEMO 150 MG IV SOLR
300.0000 mg | Freq: Once | INTRAVENOUS | Status: AC
  Administered 2017-05-09: 300 mg via INTRAVENOUS
  Filled 2017-05-09: qty 14.3

## 2017-05-09 MED ORDER — DIPHENHYDRAMINE HCL 25 MG PO CAPS
ORAL_CAPSULE | ORAL | Status: AC
  Filled 2017-05-09: qty 2

## 2017-05-09 MED ORDER — DIPHENHYDRAMINE HCL 25 MG PO CAPS
50.0000 mg | ORAL_CAPSULE | Freq: Once | ORAL | Status: AC
  Administered 2017-05-09: 25 mg via ORAL

## 2017-05-09 MED ORDER — ACETAMINOPHEN 325 MG PO TABS
650.0000 mg | ORAL_TABLET | Freq: Once | ORAL | Status: AC
  Administered 2017-05-09: 650 mg via ORAL

## 2017-05-09 MED ORDER — ACETAMINOPHEN 325 MG PO TABS
ORAL_TABLET | ORAL | Status: AC
  Filled 2017-05-09: qty 2

## 2017-05-09 MED ORDER — SODIUM CHLORIDE 0.9% FLUSH
10.0000 mL | INTRAVENOUS | Status: DC | PRN
  Administered 2017-05-09: 10 mL
  Filled 2017-05-09: qty 10

## 2017-05-09 MED ORDER — SODIUM CHLORIDE 0.9 % IV SOLN
Freq: Once | INTRAVENOUS | Status: AC
  Administered 2017-05-09: 11:00:00 via INTRAVENOUS

## 2017-05-09 MED ORDER — HEPARIN SOD (PORK) LOCK FLUSH 100 UNIT/ML IV SOLN
500.0000 [IU] | Freq: Once | INTRAVENOUS | Status: AC | PRN
  Administered 2017-05-09: 500 [IU]
  Filled 2017-05-09: qty 5

## 2017-05-09 NOTE — Progress Notes (Signed)
Patient ok to treat with last echo 12/2016 per Dr. Lindi Adie. Sent message to scheduling to make appt for echo next week. RN in infusion notified.

## 2017-05-09 NOTE — Progress Notes (Signed)
Okay to treat today with echo from 01-08-17, per May, RN, per Dr. Lindi Adie. Echo to be schedueld per nurse May.

## 2017-05-09 NOTE — Patient Instructions (Signed)
[facility] Discharge Instructions for Patients Receiving Chemotherapy  Today you received the following chemotherapy agents: Herceptin   To help prevent nausea and vomiting after your treatment, we encourage you to take your nausea medication as directed.    If you develop nausea and vomiting that is not controlled by your nausea medication, call the clinic.   BELOW ARE SYMPTOMS THAT SHOULD BE REPORTED IMMEDIATELY:  *FEVER GREATER THAN 100.5 F  *CHILLS WITH OR WITHOUT FEVER  NAUSEA AND VOMITING THAT IS NOT CONTROLLED WITH YOUR NAUSEA MEDICATION  *UNUSUAL SHORTNESS OF BREATH  *UNUSUAL BRUISING OR BLEEDING  TENDERNESS IN MOUTH AND THROAT WITH OR WITHOUT PRESENCE OF ULCERS  *URINARY PROBLEMS  *BOWEL PROBLEMS  UNUSUAL RASH Items with * indicate a potential emergency and should be followed up as soon as possible.  Feel free to call the clinic you have any questions or concerns. The clinic phone number is [phone].  Please show the CHEMO ALERT CARD at check-in to the Emergency Department and triage nurse.   

## 2017-05-10 ENCOUNTER — Ambulatory Visit: Payer: BLUE CROSS/BLUE SHIELD

## 2017-05-11 ENCOUNTER — Ambulatory Visit: Payer: BLUE CROSS/BLUE SHIELD

## 2017-05-14 ENCOUNTER — Ambulatory Visit: Payer: BLUE CROSS/BLUE SHIELD

## 2017-05-15 ENCOUNTER — Ambulatory Visit: Payer: BLUE CROSS/BLUE SHIELD

## 2017-05-16 ENCOUNTER — Ambulatory Visit: Payer: BLUE CROSS/BLUE SHIELD

## 2017-05-16 ENCOUNTER — Ambulatory Visit (HOSPITAL_COMMUNITY)
Admission: RE | Admit: 2017-05-16 | Discharge: 2017-05-16 | Disposition: A | Discharge status: Home / Self Care | Payer: BLUE CROSS/BLUE SHIELD | Source: Ambulatory Visit | Attending: Hematology and Oncology | Admitting: Hematology and Oncology

## 2017-05-16 ENCOUNTER — Ambulatory Visit
Admission: RE | Admit: 2017-05-16 | Discharge: 2017-05-16 | Disposition: A | Discharge status: Home / Self Care | Payer: BLUE CROSS/BLUE SHIELD | Source: Ambulatory Visit | Attending: Radiation Oncology | Admitting: Radiation Oncology

## 2017-05-16 DIAGNOSIS — Z5181 Encounter for therapeutic drug level monitoring: Secondary | ICD-10-CM | POA: Diagnosis not present

## 2017-05-16 DIAGNOSIS — Z79899 Other long term (current) drug therapy: Secondary | ICD-10-CM | POA: Insufficient documentation

## 2017-05-16 DIAGNOSIS — I517 Cardiomegaly: Secondary | ICD-10-CM | POA: Diagnosis not present

## 2017-05-16 DIAGNOSIS — C50919 Malignant neoplasm of unspecified site of unspecified female breast: Secondary | ICD-10-CM | POA: Diagnosis not present

## 2017-05-16 DIAGNOSIS — Z170 Estrogen receptor positive status [ER+]: Principal | ICD-10-CM

## 2017-05-16 DIAGNOSIS — C50212 Malignant neoplasm of upper-inner quadrant of left female breast: Secondary | ICD-10-CM

## 2017-05-16 NOTE — Progress Notes (Signed)
  Radiation Oncology         [phone] ________________________________  Name: Miranda Woods MRN: [id number]  Date: 05/16/2017  DOB: [birth year]  SIMULATION AND TREATMENT PLANNING NOTE / Special treatment procedure   Outpatient  DIAGNOSIS:     ICD-10-CM   1. Malignant neoplasm of upper-inner quadrant of left breast in female, estrogen receptor positive ([place]) C50.212    Z17.0     NARRATIVE:  The patient was brought to the CT Simulation planning suite again due to breat surgery that was done after her simulation in September - we need to verify today if anatomy is stable enough to proceed with RT as planned.  Identity was confirmed.  All relevant records and images related to the planned course of therapy were reviewed.  The patient freely provided informed written consent to proceed with treatment after reviewing the details related to the planned course of therapy. The consent form was witnessed and verified by the simulation staff.    Then, the patient was set-up in a stable reproducible supine position for radiation therapy with her ipsilateral arm over her head, and her upper body secured in a previously custom-made Vac-lok device.  CT images were obtained.  Surface markings were placed.  The CT images were loaded into the planning software.   Special treatment procedure:  Special treatment procedure was performed today due to the extra time and effort required by myself to plan and prepare this patient for deep inspiration breath hold technique.  I have determined cardiac sparing to be of benefit to this patient to prevent long term cardiac damage due to radiation of the heart.  Bellows were placed on the patient's abdomen. To facilitate cardiac sparing, the patient was coached by the radiation therapists on breath hold techniques and breathing practice was performed. Practice waveforms were obtained. The patient was then scanned while maintaining breath hold in the treatment position.   This image was then transferred over to the imaging specialist. The imaging specialist then created a fusion of the free breathing and breath hold scans using the chest wall as the stable structure. I personally reviewed the fusion in axial, coronal and sagittal image planes.  Excellent cardiac sparing was obtained.  I felt the patient is an appropriate candidate for breath hold and the patient will be treated as such.  The image fusion was then reviewed with the patient to reinforce the necessity of reproducible breath hold.  TREATMENT PLANNING NOTE:  We will keep her prior treatment plan. The only change was a slight recontour of her lumpectomy cavity.   -----------------------------------  Eppie Gibson, MD

## 2017-05-16 NOTE — Progress Notes (Signed)
  Echocardiogram 2D Echocardiogram has been performed.  Matilde Bash 05/16/2017, 10:45 AM

## 2017-05-17 ENCOUNTER — Ambulatory Visit: Payer: BLUE CROSS/BLUE SHIELD

## 2017-05-18 ENCOUNTER — Ambulatory Visit: Payer: BLUE CROSS/BLUE SHIELD

## 2017-05-21 ENCOUNTER — Ambulatory Visit: Payer: BLUE CROSS/BLUE SHIELD

## 2017-05-21 ENCOUNTER — Ambulatory Visit
Admission: RE | Admit: 2017-05-21 | Discharge: 2017-05-21 | Disposition: A | Discharge status: Home / Self Care | Payer: BLUE CROSS/BLUE SHIELD | Source: Ambulatory Visit | Attending: Radiation Oncology | Admitting: Radiation Oncology

## 2017-05-21 DIAGNOSIS — C50212 Malignant neoplasm of upper-inner quadrant of left female breast: Secondary | ICD-10-CM

## 2017-05-21 DIAGNOSIS — Z170 Estrogen receptor positive status [ER+]: Principal | ICD-10-CM

## 2017-05-21 DIAGNOSIS — Z510 Encounter for antineoplastic radiation therapy: Secondary | ICD-10-CM | POA: Diagnosis not present

## 2017-05-21 MED ORDER — ALRA NON-METALLIC DEODORANT (RAD-ONC)
1.0000 "application " | Freq: Once | TOPICAL | Status: AC
  Administered 2017-05-21: 1 via TOPICAL

## 2017-05-21 MED ORDER — RADIAPLEXRX EX GEL
Freq: Two times a day (BID) | CUTANEOUS | Status: DC
  Administered 2017-05-21: 12:00:00 via TOPICAL

## 2017-05-21 NOTE — Progress Notes (Signed)

## 2017-05-22 ENCOUNTER — Ambulatory Visit
Admission: RE | Admit: 2017-05-22 | Discharge: 2017-05-22 | Disposition: A | Discharge status: Home / Self Care | Payer: BLUE CROSS/BLUE SHIELD | Source: Ambulatory Visit | Attending: Radiation Oncology | Admitting: Radiation Oncology

## 2017-05-22 ENCOUNTER — Ambulatory Visit: Payer: BLUE CROSS/BLUE SHIELD

## 2017-05-22 DIAGNOSIS — Z510 Encounter for antineoplastic radiation therapy: Secondary | ICD-10-CM | POA: Diagnosis not present

## 2017-05-23 ENCOUNTER — Ambulatory Visit
Admission: RE | Admit: 2017-05-23 | Discharge: 2017-05-23 | Disposition: A | Discharge status: Home / Self Care | Payer: BLUE CROSS/BLUE SHIELD | Source: Ambulatory Visit | Attending: Radiation Oncology | Admitting: Radiation Oncology

## 2017-05-23 DIAGNOSIS — Z510 Encounter for antineoplastic radiation therapy: Secondary | ICD-10-CM | POA: Diagnosis not present

## 2017-05-24 ENCOUNTER — Ambulatory Visit
Admission: RE | Admit: 2017-05-24 | Discharge: 2017-05-24 | Disposition: A | Discharge status: Home / Self Care | Payer: BLUE CROSS/BLUE SHIELD | Source: Ambulatory Visit | Attending: Radiation Oncology | Admitting: Radiation Oncology

## 2017-05-24 DIAGNOSIS — C50212 Malignant neoplasm of upper-inner quadrant of left female breast: Secondary | ICD-10-CM

## 2017-05-24 DIAGNOSIS — Z170 Estrogen receptor positive status [ER+]: Principal | ICD-10-CM

## 2017-05-24 DIAGNOSIS — Z510 Encounter for antineoplastic radiation therapy: Secondary | ICD-10-CM | POA: Diagnosis not present

## 2017-05-24 MED ORDER — RADIAPLEXRX EX GEL
Freq: Once | CUTANEOUS | Status: AC
  Administered 2017-05-24: 09:00:00 via TOPICAL

## 2017-05-24 MED ORDER — RADIAPLEXRX EX GEL: Freq: Once | CUTANEOUS | Status: DC

## 2017-05-25 ENCOUNTER — Ambulatory Visit
Admission: RE | Admit: 2017-05-25 | Discharge: 2017-05-25 | Disposition: A | Discharge status: Home / Self Care | Payer: BLUE CROSS/BLUE SHIELD | Source: Ambulatory Visit | Attending: Radiation Oncology | Admitting: Radiation Oncology

## 2017-05-25 DIAGNOSIS — C50212 Malignant neoplasm of upper-inner quadrant of left female breast: Secondary | ICD-10-CM

## 2017-05-25 DIAGNOSIS — Z510 Encounter for antineoplastic radiation therapy: Secondary | ICD-10-CM | POA: Diagnosis not present

## 2017-05-25 DIAGNOSIS — Z170 Estrogen receptor positive status [ER+]: Principal | ICD-10-CM

## 2017-05-28 ENCOUNTER — Ambulatory Visit
Admission: RE | Admit: 2017-05-28 | Discharge: 2017-05-28 | Disposition: A | Discharge status: Home / Self Care | Payer: BLUE CROSS/BLUE SHIELD | Source: Ambulatory Visit | Attending: Radiation Oncology | Admitting: Radiation Oncology

## 2017-05-28 DIAGNOSIS — Z510 Encounter for antineoplastic radiation therapy: Secondary | ICD-10-CM | POA: Diagnosis not present

## 2017-05-29 ENCOUNTER — Ambulatory Visit
Admission: RE | Admit: 2017-05-29 | Discharge: 2017-05-29 | Disposition: A | Discharge status: Home / Self Care | Payer: BLUE CROSS/BLUE SHIELD | Source: Ambulatory Visit | Attending: Radiation Oncology | Admitting: Radiation Oncology

## 2017-05-29 DIAGNOSIS — Z510 Encounter for antineoplastic radiation therapy: Secondary | ICD-10-CM | POA: Diagnosis not present

## 2017-05-30 ENCOUNTER — Ambulatory Visit (HOSPITAL_BASED_OUTPATIENT_CLINIC_OR_DEPARTMENT_OTHER): Payer: BLUE CROSS/BLUE SHIELD | Admitting: Adult Health

## 2017-05-30 ENCOUNTER — Ambulatory Visit (HOSPITAL_BASED_OUTPATIENT_CLINIC_OR_DEPARTMENT_OTHER): Payer: BLUE CROSS/BLUE SHIELD

## 2017-05-30 ENCOUNTER — Encounter: Payer: Self-pay | Admitting: Adult Health

## 2017-05-30 ENCOUNTER — Ambulatory Visit: Payer: BLUE CROSS/BLUE SHIELD | Admitting: Adult Health

## 2017-05-30 ENCOUNTER — Ambulatory Visit
Admission: RE | Admit: 2017-05-30 | Discharge: 2017-05-30 | Disposition: A | Discharge status: Home / Self Care | Payer: BLUE CROSS/BLUE SHIELD | Source: Ambulatory Visit | Attending: Radiation Oncology | Admitting: Radiation Oncology

## 2017-05-30 ENCOUNTER — Encounter: Payer: Self-pay | Admitting: *Deleted

## 2017-05-30 VITALS — BP 144/72 | HR 65 | Temp 97.4°F | Resp 18 | Ht 65.0 in | Wt 116.3 lb

## 2017-05-30 DIAGNOSIS — C50212 Malignant neoplasm of upper-inner quadrant of left female breast: Secondary | ICD-10-CM | POA: Diagnosis not present

## 2017-05-30 DIAGNOSIS — Z5112 Encounter for antineoplastic immunotherapy: Secondary | ICD-10-CM

## 2017-05-30 DIAGNOSIS — Z170 Estrogen receptor positive status [ER+]: Secondary | ICD-10-CM

## 2017-05-30 DIAGNOSIS — Z510 Encounter for antineoplastic radiation therapy: Secondary | ICD-10-CM | POA: Diagnosis not present

## 2017-05-30 LAB — COMPREHENSIVE METABOLIC PANEL
ALT: 10 U/L (ref 0–55)
ANION GAP: 8 meq/L (ref 3–11)
AST: 13 U/L (ref 5–34)
Albumin: 3.9 g/dL (ref 3.5–5.0)
Alkaline Phosphatase: 61 U/L (ref 40–150)
BILIRUBIN TOTAL: 0.36 mg/dL (ref 0.20–1.20)
BUN: 11.9 mg/dL (ref 7.0–26.0)
CALCIUM: 9.2 mg/dL (ref 8.4–10.4)
CO2: 25 meq/L (ref 22–29)
CREATININE: 0.6 mg/dL (ref 0.6–1.1)
Chloride: 107 mEq/L (ref 98–109)
Glucose: 150 mg/dl — ABNORMAL HIGH (ref 70–140)
Potassium: 3.8 mEq/L (ref 3.5–5.1)
Sodium: 140 mEq/L (ref 136–145)
TOTAL PROTEIN: 6.7 g/dL (ref 6.4–8.3)

## 2017-05-30 LAB — CBC WITH DIFFERENTIAL/PLATELET
BASO%: 0.3 % (ref 0.0–2.0)
Basophils Absolute: 0 10*3/uL (ref 0.0–0.1)
EOS ABS: 0 10*3/uL (ref 0.0–0.5)
EOS%: 1.4 % (ref 0.0–7.0)
HEMATOCRIT: 36.4 % (ref 34.8–46.6)
HGB: 12.6 g/dL (ref 11.6–15.9)
LYMPH#: 0.7 10*3/uL — AB (ref 0.9–3.3)
LYMPH%: 25.2 % (ref 14.0–49.7)
MCH: 32.1 pg (ref 25.1–34.0)
MCHC: 34.6 g/dL (ref 31.5–36.0)
MCV: 92.9 fL (ref 79.5–101.0)
MONO#: 0.3 10*3/uL (ref 0.1–0.9)
MONO%: 8.5 % (ref 0.0–14.0)
NEUT%: 64.6 % (ref 38.4–76.8)
NEUTROS ABS: 1.9 10*3/uL (ref 1.5–6.5)
PLATELETS: 181 10*3/uL (ref 145–400)
RBC: 3.92 10*6/uL (ref 3.70–5.45)
RDW: 11.6 % (ref 11.2–14.5)
WBC: 2.9 10*3/uL — ABNORMAL LOW (ref 3.9–10.3)

## 2017-05-30 MED ORDER — MELATONIN 5 MG PO TABS: 5.0000 mg | ORAL_TABLET | Freq: Every evening | ORAL | 0 refills | Status: DC | PRN

## 2017-05-30 MED ORDER — SODIUM CHLORIDE 0.9% FLUSH
10.0000 mL | INTRAVENOUS | Status: DC | PRN
  Administered 2017-05-30: 10 mL
  Filled 2017-05-30: qty 10

## 2017-05-30 MED ORDER — DIPHENHYDRAMINE HCL 25 MG PO CAPS
ORAL_CAPSULE | ORAL | Status: AC
  Filled 2017-05-30: qty 2

## 2017-05-30 MED ORDER — SODIUM CHLORIDE 0.9 % IV SOLN
Freq: Once | INTRAVENOUS | Status: AC
  Administered 2017-05-30: 10:00:00 via INTRAVENOUS

## 2017-05-30 MED ORDER — TRASTUZUMAB CHEMO 150 MG IV SOLR
300.0000 mg | Freq: Once | INTRAVENOUS | Status: AC
  Administered 2017-05-30: 300 mg via INTRAVENOUS
  Filled 2017-05-30: qty 14.29

## 2017-05-30 MED ORDER — HEPARIN SOD (PORK) LOCK FLUSH 100 UNIT/ML IV SOLN
500.0000 [IU] | Freq: Once | INTRAVENOUS | Status: AC | PRN
Start: 2017-05-30 — End: 2017-05-30
  Administered 2017-05-30: 500 [IU]
  Filled 2017-05-30: qty 5

## 2017-05-30 MED ORDER — ACETAMINOPHEN 325 MG PO TABS
650.0000 mg | ORAL_TABLET | Freq: Once | ORAL | Status: AC
  Administered 2017-05-30: 650 mg via ORAL

## 2017-05-30 MED ORDER — ACETAMINOPHEN 325 MG PO TABS
ORAL_TABLET | ORAL | Status: AC
  Filled 2017-05-30: qty 2

## 2017-05-30 MED ORDER — DIPHENHYDRAMINE HCL 25 MG PO CAPS
50.0000 mg | ORAL_CAPSULE | Freq: Once | ORAL | Status: AC
  Administered 2017-05-30: 25 mg via ORAL

## 2017-05-30 NOTE — Patient Instructions (Signed)
[facility] Discharge Instructions for Patients Receiving Chemotherapy  Today you received the following chemotherapy agents Herceptin  To help prevent nausea and vomiting after your treatment, we encourage you to take your nausea medication as directed   If you develop nausea and vomiting that is not controlled by your nausea medication, call the clinic.   BELOW ARE SYMPTOMS THAT SHOULD BE REPORTED IMMEDIATELY:  *FEVER GREATER THAN 100.5 F  *CHILLS WITH OR WITHOUT FEVER  NAUSEA AND VOMITING THAT IS NOT CONTROLLED WITH YOUR NAUSEA MEDICATION  *UNUSUAL SHORTNESS OF BREATH  *UNUSUAL BRUISING OR BLEEDING  TENDERNESS IN MOUTH AND THROAT WITH OR WITHOUT PRESENCE OF ULCERS  *URINARY PROBLEMS  *BOWEL PROBLEMS  UNUSUAL RASH Items with * indicate a potential emergency and should be followed up as soon as possible.  Feel free to call the clinic should you have any questions or concerns. The clinic phone number is [phone].  Please show the CHEMO ALERT CARD at check-in to the Emergency Department and triage nurse.   

## 2017-05-30 NOTE — Progress Notes (Signed)
[facility] Follow up:    Lin Landsman, [address] [place] [address]   DIAGNOSIS: Cancer Staging Malignant neoplasm of upper-inner quadrant of left breast in female, estrogen receptor positive ([place]) Staging form: Breast, AJCC 8th Edition - Clinical stage from 09/20/2016: Stage IB (cT2, cN0, cM0, G2, ER: Positive, PR: Positive, HER2: Positive) - Unsigned   SUMMARY OF ONCOLOGIC HISTORY:   Malignant neoplasm of upper-inner quadrant of left breast in female, estrogen receptor positive ([place])   09/14/2016 Initial Diagnosis    Palpable left breast mass for 2 months, ultrasound at 9:00: 4.7 x 3.6 x 3.3 cm, axilla negative: U/S Bx: Grade 2 IDC ER 95%, PR 50%, HER-2 positive ratio 6.17, Ki-67 20%, T2 N0 stage IIA clinical stage      09/27/2016 Breast MRI    Left breast: 3.5 cm irregular necrotic mass; left axillary lymph node 1.2 cm; right breast 0.6 cm mass at 6:00 position 0.5 cm mass right breast lower inner quadrant       10/11/2016 - 01/24/2017 Neo-Adjuvant Chemotherapy    Taxotere, Carboplatin, Herceptin, Perjeta x 6 cycles followed by Herceptin and Perjeta maintenance for one year      01/26/2017 Breast MRI    Left breast: Irregular enhancing mass measuring 2.7 cm, smaller than previous, no abnormal lymph nodes; right breast: No mass or enhancement       03/05/2017 Surgery    Left lumpectomy: IDC grade 2, 2.5 cm, margins negative, 0/2 lymph nodes negative, ER 100%, PR 50%, HER-2 positive ratio 4.88, T2 N0 stage IB AJCC 8       04/17/2017 Mammogram    2 groups of suspicious residual calcifications in the left breast spanning 4.8 cm one is anterior to lumpectomy the other is medial aspect of lumpectomy       CURRENT THERAPY: Herceptin  INTERVAL HISTORY: Baker Janus 61 y.o. female returns for evaluation prior to receiving Herceptin for her h/o breast cancer.  She is doing well today.  She does continue to have issues with sleep.  She takes  Lunesta at night.  She does exercise with walking, but is aiming to do more.     Patient Active Problem List   Diagnosis Date Noted  . Rash 10/26/2016  . Port catheter in place 10/18/2016  . Malignant neoplasm of upper-inner quadrant of left breast in female, estrogen receptor positive ([place]) 09/19/2016  . ADHD (attention deficit hyperactivity disorder)   . Fibrocystic breast disease     is allergic to codeine.  MEDICAL HISTORY: Past Medical History:  Diagnosis Date  . ADHD (attention deficit hyperactivity disorder)   . Breast cancer ([place])    left 2018  . Cancer ([place]) 12/2016   left breast cancer  . Fibrocystic breast disease   . Personal history of chemotherapy     SURGICAL HISTORY: Past Surgical History:  Procedure Laterality Date  . ANKLE FRACTURE SURGERY Left   . APPENDECTOMY    . BREAST BIOPSY    . BREAST LUMPECTOMY Left    2018  . BREAST LUMPECTOMY WITH RADIOACTIVE SEED AND SENTINEL LYMPH NODE BIOPSY Left 03/05/2017   Procedure: LEFT BREAST LUMPECTOMY WITH RADIOACTIVE SEED AND SENTINEL LYMPH NODE BIOPSY AND BLUE DYE INJECTION;  Surgeon: Rolm Bookbinder, MD;  Location: [facility];  Service: General;  Laterality: Left;  . BREAST LUMPECTOMY WITH RADIOACTIVE SEED LOCALIZATION Left 04/23/2017   Procedure: EXCISION OF LEFT BREAST SEED LOCALIZED MARGINS (TWO SEEDS);  Surgeon: Rolm Bookbinder, MD;  Location: [facility];  Service: General;  Laterality: Left;  . HUMERUS FRACTURE SURGERY Left   . IR GENERIC HISTORICAL  10/10/2016   IR US GUIDE VASC ACCESS RIGHT 10/10/2016 Jacqulynn Cadet, MD [facility]  . IR GENERIC HISTORICAL  10/10/2016   IR FLUORO GUIDE PORT INSERTION RIGHT 10/10/2016 Jacqulynn Cadet, MD [facility]  . ORIF WRIST FRACTURE    . TEMPOROMANDIBULAR JOINT SURGERY  1986    SOCIAL HISTORY: Social History   Social History  . Marital status: Single    Spouse name: [name]  . Number of children: 0  . Years of education:  N/A   Occupational History  . counselor    Social History Main Topics  . Smoking status: Former Smoker    Types: Cigarettes  . Smokeless tobacco: Never Used  . Alcohol use No  . Drug use: No  . Sexual activity: Yes    Partners: Male    Birth control/ protection: Post-menopausal   Other Topics Concern  . Not on file   Social History Narrative  . No narrative on file    FAMILY HISTORY: Family History  Problem Relation Age of Onset  . Heart disease Father   . Hypertension Father   . Thyroid disease Mother     Review of Systems  Constitutional: Negative for appetite change, chills, fatigue, fever and unexpected weight change.  HENT:   Negative for hearing loss and lump/mass.   Eyes: Negative for eye problems and icterus.  Respiratory: Negative for chest tightness, cough and shortness of breath.   Cardiovascular: Negative for chest pain, leg swelling and palpitations.  Gastrointestinal: Negative for abdominal distention, abdominal pain, constipation, diarrhea, nausea and vomiting.  Endocrine: Negative for hot flashes.  Musculoskeletal: Negative for arthralgias.  Skin: Negative for itching and rash.  Neurological: Negative for dizziness, extremity weakness, headaches and numbness.  Hematological: Negative for adenopathy. Does not bruise/bleed easily.  Psychiatric/Behavioral: Negative for depression. The patient is not nervous/anxious.       PHYSICAL EXAMINATION  ECOG PERFORMANCE STATUS: 1 - Symptomatic but completely ambulatory  Vitals:   05/30/17 0919  BP: (!) 144/72  Pulse: 65  Resp: 18  Temp: (!) 97.4 F (36.3 C)  SpO2: 100%    Physical Exam  Constitutional: She is oriented to person, place, and time and well-developed, well-nourished, and in no distress.  HENT:  Head: Normocephalic and atraumatic.  Mouth/Throat: Oropharynx is clear and moist. No oropharyngeal exudate.  Eyes: Pupils are equal, round, and reactive to light. No scleral icterus.  Neck: Neck  supple.  Cardiovascular: Normal rate, regular rhythm, normal heart sounds and intact distal pulses.   Pulmonary/Chest: Effort normal and breath sounds normal. No respiratory distress. She has no wheezes. She has no rales.  Abdominal: Soft. Bowel sounds are normal. She exhibits no distension and no mass. There is no tenderness. There is no rebound and no guarding.  Musculoskeletal: She exhibits no edema.  Lymphadenopathy:    She has no cervical adenopathy.  Neurological: She is alert and oriented to person, place, and time.  Skin: Skin is warm and dry. No rash noted.  Psychiatric: Mood and affect normal.    LABORATORY DATA:  CBC    Component Value Date/Time   WBC 2.9 (L) 05/30/2017 1022   WBC 5.6 10/10/2016 1152   RBC 3.92 05/30/2017 1022   RBC 4.31 10/10/2016 1152   HGB 12.6 05/30/2017 1022   HCT 36.4 05/30/2017 1022   PLT 181 05/30/2017 1022  MCV 92.9 05/30/2017 1022   MCH 32.1 05/30/2017 1022   MCH 30.9 10/10/2016 1152   MCHC 34.6 05/30/2017 1022   MCHC 34.6 10/10/2016 1152   RDW 11.6 05/30/2017 1022   LYMPHSABS 0.7 (L) 05/30/2017 1022   MONOABS 0.3 05/30/2017 1022   EOSABS 0.0 05/30/2017 1022   BASOSABS 0.0 05/30/2017 1022    CMP     Component Value Date/Time   NA 140 05/30/2017 1022   K 3.8 05/30/2017 1022   CL 101 11/30/2011 1649   CO2 25 05/30/2017 1022   GLUCOSE 150 (H) 05/30/2017 1022   BUN 11.9 05/30/2017 1022   CREATININE 0.6 05/30/2017 1022   CALCIUM 9.2 05/30/2017 1022   PROT 6.7 05/30/2017 1022   ALBUMIN 3.9 05/30/2017 1022   AST 13 05/30/2017 1022   ALT 10 05/30/2017 1022   ALKPHOS 61 05/30/2017 1022   BILITOT 0.36 05/30/2017 1022       ASSESSMENT and  PLAN:   Malignant neoplasm of upper-inner quadrant of left breast in female, estrogen receptor positive ([place]) Palpable left breast mass for 2 months, ultrasound at 9:00: 4.7 x 3.6 x 3.3 cm, axilla negative: U/S Bx: Grade 2 IDC ER 95%, PR 50%, HER-2 positive ratio 6.17, Ki-67 20%,  Right  breast biopsy: PASH  03/05/2017 Left lumpectomy: IDC grade 2, 2.5 cm, margins negative, 0/2 lymph nodes negative, ER 100%, PR 50%, HER-2 positive ratio 4.88, T2 N0 stage IB AJCC 8  Treatment summary 1. Neoadjuvant chemotherapy with TCH Perjeta 6 started 10/11/2016 completed 01/24/2017 followed by Herceptinmaintenance for 1 year 2.Breast conserving surgery on 03/05/2017 3. Followed by adjuvant radiation therapy 4. Followed by adjuvant antiestrogen therapy ------------------------------------------------------------------------------ Plan: 1. Continue Herceptin  maintenance 2. radiation therapy 3. Followed by adjuvant antiestrogen therapy  We reviewed her difficulty sleeping.  She is going to try adding melatonin to her regimen.  I encouraged her to work on exercising more as well.  Her last echo was this month and was normal.  She will return in 3 weeks for Herceptin, and 6 weeks for labs/follow up and Herceptin.      All questions were answered. The patient knows to call the clinic with any problems, questions or concerns. We can certainly see the patient much sooner if necessary.  A total of (30) minutes of face-to-face time was spent with this patient with greater than 50% of that time in counseling and care-coordination.  This note was electronically signed. Scot Dock, NP 05/30/2017

## 2017-05-30 NOTE — Assessment & Plan Note (Addendum)
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 Neoadjuvant chemotherapy with TCH Perjeta 6 started 10/11/2016 completed 01/24/2017 followed by Herceptinmaintenance for 1 year 2.Breast conserving surgery on 03/05/2017 3. Followed by adjuvant radiation therapy 4. Followed by adjuvant antiestrogen therapy ------------------------------------------------------------------------------ Plan: 1. Continue Herceptin  maintenance 2. radiation therapy 3. Followed by adjuvant antiestrogen therapy  We reviewed her difficulty sleeping.  She is going to try adding melatonin to her regimen.  I encouraged her to work on exercising more as well.  Her last echo was this month and was normal.  She will return in 3 weeks for Herceptin, and 6 weeks for labs/follow up and Herceptin.     

## 2017-05-31 ENCOUNTER — Ambulatory Visit
Admission: RE | Admit: 2017-05-31 | Discharge: 2017-05-31 | Disposition: A | Discharge status: Home / Self Care | Payer: BLUE CROSS/BLUE SHIELD | Source: Ambulatory Visit | Attending: Radiation Oncology | Admitting: Radiation Oncology

## 2017-05-31 DIAGNOSIS — Z510 Encounter for antineoplastic radiation therapy: Secondary | ICD-10-CM | POA: Diagnosis not present

## 2017-06-01 ENCOUNTER — Ambulatory Visit
Admission: RE | Admit: 2017-06-01 | Discharge: 2017-06-01 | Disposition: A | Discharge status: Home / Self Care | Payer: BLUE CROSS/BLUE SHIELD | Source: Ambulatory Visit | Attending: Radiation Oncology | Admitting: Radiation Oncology

## 2017-06-01 DIAGNOSIS — Z510 Encounter for antineoplastic radiation therapy: Secondary | ICD-10-CM | POA: Diagnosis not present

## 2017-06-04 ENCOUNTER — Ambulatory Visit
Admission: RE | Admit: 2017-06-04 | Discharge: 2017-06-04 | Disposition: A | Discharge status: Home / Self Care | Payer: BLUE CROSS/BLUE SHIELD | Source: Ambulatory Visit | Attending: Radiation Oncology | Admitting: Radiation Oncology

## 2017-06-04 DIAGNOSIS — Z510 Encounter for antineoplastic radiation therapy: Secondary | ICD-10-CM | POA: Diagnosis not present

## 2017-06-05 ENCOUNTER — Ambulatory Visit
Admission: RE | Admit: 2017-06-05 | Discharge: 2017-06-05 | Disposition: A | Discharge status: Home / Self Care | Payer: BLUE CROSS/BLUE SHIELD | Source: Ambulatory Visit | Attending: Radiation Oncology | Admitting: Radiation Oncology

## 2017-06-05 DIAGNOSIS — Z510 Encounter for antineoplastic radiation therapy: Secondary | ICD-10-CM | POA: Diagnosis not present

## 2017-06-06 ENCOUNTER — Ambulatory Visit
Admission: RE | Admit: 2017-06-06 | Discharge: 2017-06-06 | Disposition: A | Discharge status: Home / Self Care | Payer: BLUE CROSS/BLUE SHIELD | Source: Ambulatory Visit | Attending: Radiation Oncology | Admitting: Radiation Oncology

## 2017-06-06 DIAGNOSIS — Z510 Encounter for antineoplastic radiation therapy: Secondary | ICD-10-CM | POA: Diagnosis not present

## 2017-06-07 ENCOUNTER — Ambulatory Visit: Payer: BLUE CROSS/BLUE SHIELD

## 2017-06-07 ENCOUNTER — Ambulatory Visit
Admission: RE | Admit: 2017-06-07 | Discharge: 2017-06-07 | Disposition: A | Discharge status: Home / Self Care | Payer: BLUE CROSS/BLUE SHIELD | Source: Ambulatory Visit | Attending: Radiation Oncology | Admitting: Radiation Oncology

## 2017-06-07 DIAGNOSIS — Z510 Encounter for antineoplastic radiation therapy: Secondary | ICD-10-CM | POA: Diagnosis not present

## 2017-06-08 ENCOUNTER — Ambulatory Visit
Admission: RE | Admit: 2017-06-08 | Discharge: 2017-06-08 | Disposition: A | Discharge status: Home / Self Care | Payer: BLUE CROSS/BLUE SHIELD | Source: Ambulatory Visit | Attending: Radiation Oncology | Admitting: Radiation Oncology

## 2017-06-08 DIAGNOSIS — Z510 Encounter for antineoplastic radiation therapy: Secondary | ICD-10-CM | POA: Diagnosis not present

## 2017-06-11 ENCOUNTER — Ambulatory Visit: Payer: BLUE CROSS/BLUE SHIELD

## 2017-06-11 ENCOUNTER — Ambulatory Visit
Admission: RE | Admit: 2017-06-11 | Discharge: 2017-06-11 | Disposition: A | Discharge status: Home / Self Care | Payer: BLUE CROSS/BLUE SHIELD | Source: Ambulatory Visit | Attending: Radiation Oncology | Admitting: Radiation Oncology

## 2017-06-11 DIAGNOSIS — Z510 Encounter for antineoplastic radiation therapy: Secondary | ICD-10-CM | POA: Diagnosis not present

## 2017-06-11 DIAGNOSIS — C50212 Malignant neoplasm of upper-inner quadrant of left female breast: Secondary | ICD-10-CM

## 2017-06-11 DIAGNOSIS — Z170 Estrogen receptor positive status [ER+]: Principal | ICD-10-CM

## 2017-06-11 MED ORDER — RADIAPLEXRX EX GEL
Freq: Two times a day (BID) | CUTANEOUS | Status: DC
  Administered 2017-06-11: 09:00:00 via TOPICAL

## 2017-06-12 ENCOUNTER — Ambulatory Visit
Admission: RE | Admit: 2017-06-12 | Discharge: 2017-06-12 | Disposition: A | Discharge status: Home / Self Care | Payer: BLUE CROSS/BLUE SHIELD | Source: Ambulatory Visit | Attending: Radiation Oncology | Admitting: Radiation Oncology

## 2017-06-12 ENCOUNTER — Ambulatory Visit: Payer: BLUE CROSS/BLUE SHIELD

## 2017-06-12 DIAGNOSIS — Z510 Encounter for antineoplastic radiation therapy: Secondary | ICD-10-CM | POA: Diagnosis not present

## 2017-06-13 ENCOUNTER — Ambulatory Visit
Admission: RE | Admit: 2017-06-13 | Discharge: 2017-06-13 | Disposition: A | Discharge status: Home / Self Care | Payer: BLUE CROSS/BLUE SHIELD | Source: Ambulatory Visit | Attending: Radiation Oncology | Admitting: Radiation Oncology

## 2017-06-13 DIAGNOSIS — Z510 Encounter for antineoplastic radiation therapy: Secondary | ICD-10-CM | POA: Diagnosis not present

## 2017-06-14 ENCOUNTER — Ambulatory Visit
Admission: RE | Admit: 2017-06-14 | Discharge: 2017-06-14 | Disposition: A | Discharge status: Home / Self Care | Payer: BLUE CROSS/BLUE SHIELD | Source: Ambulatory Visit | Attending: Radiation Oncology | Admitting: Radiation Oncology

## 2017-06-14 DIAGNOSIS — Z510 Encounter for antineoplastic radiation therapy: Secondary | ICD-10-CM | POA: Diagnosis not present

## 2017-06-15 ENCOUNTER — Ambulatory Visit
Admission: RE | Admit: 2017-06-15 | Discharge: 2017-06-15 | Disposition: A | Discharge status: Home / Self Care | Payer: BLUE CROSS/BLUE SHIELD | Source: Ambulatory Visit | Attending: Radiation Oncology | Admitting: Radiation Oncology

## 2017-06-15 ENCOUNTER — Ambulatory Visit: Payer: BLUE CROSS/BLUE SHIELD

## 2017-06-15 DIAGNOSIS — Z510 Encounter for antineoplastic radiation therapy: Secondary | ICD-10-CM | POA: Diagnosis not present

## 2017-06-16 ENCOUNTER — Ambulatory Visit: Payer: BLUE CROSS/BLUE SHIELD

## 2017-06-20 ENCOUNTER — Ambulatory Visit (HOSPITAL_BASED_OUTPATIENT_CLINIC_OR_DEPARTMENT_OTHER): Payer: BLUE CROSS/BLUE SHIELD

## 2017-06-20 VITALS — BP 139/81 | HR 71 | Temp 97.9°F | Resp 18 | Ht 65.0 in | Wt 116.5 lb

## 2017-06-20 DIAGNOSIS — Z5112 Encounter for antineoplastic immunotherapy: Secondary | ICD-10-CM | POA: Diagnosis not present

## 2017-06-20 DIAGNOSIS — C50212 Malignant neoplasm of upper-inner quadrant of left female breast: Secondary | ICD-10-CM | POA: Diagnosis not present

## 2017-06-20 DIAGNOSIS — Z170 Estrogen receptor positive status [ER+]: Principal | ICD-10-CM

## 2017-06-20 MED ORDER — ACETAMINOPHEN 325 MG PO TABS
ORAL_TABLET | ORAL | Status: AC
  Filled 2017-06-20: qty 2

## 2017-06-20 MED ORDER — ACETAMINOPHEN 325 MG PO TABS
650.0000 mg | ORAL_TABLET | Freq: Once | ORAL | Status: AC
  Administered 2017-06-20: 650 mg via ORAL

## 2017-06-20 MED ORDER — SODIUM CHLORIDE 0.9% FLUSH
10.0000 mL | INTRAVENOUS | Status: DC | PRN
  Administered 2017-06-20: 10 mL
  Filled 2017-06-20: qty 10

## 2017-06-20 MED ORDER — DIPHENHYDRAMINE HCL 25 MG PO CAPS
50.0000 mg | ORAL_CAPSULE | Freq: Once | ORAL | Status: AC
  Administered 2017-06-20: 50 mg via ORAL

## 2017-06-20 MED ORDER — TRASTUZUMAB CHEMO 150 MG IV SOLR
300.0000 mg | Freq: Once | INTRAVENOUS | Status: AC
  Administered 2017-06-20: 300 mg via INTRAVENOUS
  Filled 2017-06-20: qty 14.29

## 2017-06-20 MED ORDER — HEPARIN SOD (PORK) LOCK FLUSH 100 UNIT/ML IV SOLN
500.0000 [IU] | Freq: Once | INTRAVENOUS | Status: AC | PRN
  Administered 2017-06-20: 500 [IU]
  Filled 2017-06-20: qty 5

## 2017-06-20 MED ORDER — DIPHENHYDRAMINE HCL 25 MG PO CAPS
ORAL_CAPSULE | ORAL | Status: AC
Start: 2017-06-20 — End: 2017-06-20
  Filled 2017-06-20: qty 2

## 2017-06-20 MED ORDER — SODIUM CHLORIDE 0.9 % IV SOLN
Freq: Once | INTRAVENOUS | Status: AC
  Administered 2017-06-20: 10:00:00 via INTRAVENOUS

## 2017-06-20 NOTE — Patient Instructions (Signed)
[facility] Discharge Instructions for Patients Receiving Chemotherapy  Today you received the following chemotherapy agents Herceptin  To help prevent nausea and vomiting after your treatment, we encourage you to take your nausea medication as directed   If you develop nausea and vomiting that is not controlled by your nausea medication, call the clinic.   BELOW ARE SYMPTOMS THAT SHOULD BE REPORTED IMMEDIATELY:  *FEVER GREATER THAN 100.5 F  *CHILLS WITH OR WITHOUT FEVER  NAUSEA AND VOMITING THAT IS NOT CONTROLLED WITH YOUR NAUSEA MEDICATION  *UNUSUAL SHORTNESS OF BREATH  *UNUSUAL BRUISING OR BLEEDING  TENDERNESS IN MOUTH AND THROAT WITH OR WITHOUT PRESENCE OF ULCERS  *URINARY PROBLEMS  *BOWEL PROBLEMS  UNUSUAL RASH Items with * indicate a potential emergency and should be followed up as soon as possible.  Feel free to call the clinic should you have any questions or concerns. The clinic phone number is [phone].  Please show the CHEMO ALERT CARD at check-in to the Emergency Department and triage nurse.   

## 2017-06-29 ENCOUNTER — Encounter: Payer: Self-pay | Admitting: Radiation Oncology

## 2017-06-29 NOTE — Progress Notes (Signed)
  Radiation Oncology         [phone] ________________________________  Name: MIRABEL AHLGREN MRN: [id number]  Date: 06/29/2017  DOB: 1955-10-26  End of Treatment Note  Diagnosis: Stage [id number] LeftBreast UIQ Invasive Ductal Carcinoma, ER+/ PR+/ Her2+, Grade 2  Indication for treatment:  Curative      Radiation treatment dates:   05/21/17-06/15/17  Site/dose:   1) Left Breast / 40.05 Gy in 15 fractions 2) Left Breast Boost / 10 Gy in 5 fractions  Beams/energy:  1) tangents, 3D conformal  / 6X 2) photons / 6X, 10X  Narrative: The patient tolerated radiation treatment relatively well. She initially was without complaint and as her treatment continued she only developed mild hyperpigmentation to the skin. She was given radiaplex to use for this.   Plan: The patient has completed radiation treatment. The patient will return to radiation oncology clinic for routine followup in one month. I advised them to call or return sooner if they have any questions or concerns related to their recovery or treatment.  -----------------------------------  Eppie Gibson, MD

## 2017-06-30 HISTORY — PX: BREAST BIOPSY: SHX20

## 2017-07-11 ENCOUNTER — Ambulatory Visit: Payer: BLUE CROSS/BLUE SHIELD

## 2017-07-11 ENCOUNTER — Ambulatory Visit (HOSPITAL_BASED_OUTPATIENT_CLINIC_OR_DEPARTMENT_OTHER): Payer: BLUE CROSS/BLUE SHIELD

## 2017-07-11 ENCOUNTER — Ambulatory Visit (HOSPITAL_BASED_OUTPATIENT_CLINIC_OR_DEPARTMENT_OTHER): Payer: BLUE CROSS/BLUE SHIELD | Admitting: Hematology and Oncology

## 2017-07-11 ENCOUNTER — Other Ambulatory Visit (HOSPITAL_BASED_OUTPATIENT_CLINIC_OR_DEPARTMENT_OTHER): Payer: BLUE CROSS/BLUE SHIELD

## 2017-07-11 DIAGNOSIS — Z5112 Encounter for antineoplastic immunotherapy: Secondary | ICD-10-CM | POA: Diagnosis not present

## 2017-07-11 DIAGNOSIS — Z170 Estrogen receptor positive status [ER+]: Secondary | ICD-10-CM | POA: Diagnosis not present

## 2017-07-11 DIAGNOSIS — C50212 Malignant neoplasm of upper-inner quadrant of left female breast: Secondary | ICD-10-CM

## 2017-07-11 DIAGNOSIS — Z95828 Presence of other vascular implants and grafts: Secondary | ICD-10-CM

## 2017-07-11 DIAGNOSIS — Z79811 Long term (current) use of aromatase inhibitors: Secondary | ICD-10-CM | POA: Diagnosis not present

## 2017-07-11 LAB — COMPREHENSIVE METABOLIC PANEL
ALT: 13 U/L (ref 0–55)
ANION GAP: 10 meq/L (ref 3–11)
AST: 14 U/L (ref 5–34)
Albumin: 3.7 g/dL (ref 3.5–5.0)
Alkaline Phosphatase: 66 U/L (ref 40–150)
BILIRUBIN TOTAL: 0.5 mg/dL (ref 0.20–1.20)
BUN: 14.2 mg/dL (ref 7.0–26.0)
CALCIUM: 9 mg/dL (ref 8.4–10.4)
CO2: 24 meq/L (ref 22–29)
CREATININE: 0.6 mg/dL (ref 0.6–1.1)
Chloride: 106 mEq/L (ref 98–109)
Glucose: 93 mg/dl (ref 70–140)
Potassium: 4 mEq/L (ref 3.5–5.1)
Sodium: 140 mEq/L (ref 136–145)
TOTAL PROTEIN: 6.5 g/dL (ref 6.4–8.3)

## 2017-07-11 LAB — CBC WITH DIFFERENTIAL/PLATELET
BASO%: 0.8 % (ref 0.0–2.0)
Basophils Absolute: 0 10*3/uL (ref 0.0–0.1)
EOS%: 3.2 % (ref 0.0–7.0)
Eosinophils Absolute: 0.1 10*3/uL (ref 0.0–0.5)
HEMATOCRIT: 37.8 % (ref 34.8–46.6)
HEMOGLOBIN: 12.8 g/dL (ref 11.6–15.9)
LYMPH#: 0.7 10*3/uL — AB (ref 0.9–3.3)
LYMPH%: 29.4 % (ref 14.0–49.7)
MCH: 31.6 pg (ref 25.1–34.0)
MCHC: 33.9 g/dL (ref 31.5–36.0)
MCV: 93.3 fL (ref 79.5–101.0)
MONO#: 0.3 10*3/uL (ref 0.1–0.9)
MONO%: 13.5 % (ref 0.0–14.0)
NEUT%: 53.1 % (ref 38.4–76.8)
NEUTROS ABS: 1.3 10*3/uL — AB (ref 1.5–6.5)
Platelets: 192 10*3/uL (ref 145–400)
RBC: 4.05 10*6/uL (ref 3.70–5.45)
RDW: 12.1 % (ref 11.2–14.5)
WBC: 2.5 10*3/uL — AB (ref 3.9–10.3)

## 2017-07-11 MED ORDER — ACETAMINOPHEN 325 MG PO TABS
650.0000 mg | ORAL_TABLET | Freq: Once | ORAL | Status: AC
  Administered 2017-07-11: 650 mg via ORAL

## 2017-07-11 MED ORDER — MELATONIN 5 MG PO TABS: 5.0000 mg | ORAL_TABLET | Freq: Every evening | ORAL | 3 refills | Status: DC | PRN

## 2017-07-11 MED ORDER — SODIUM CHLORIDE 0.9% FLUSH
10.0000 mL | INTRAVENOUS | Status: DC | PRN
  Administered 2017-07-11: 10 mL
  Filled 2017-07-11: qty 10

## 2017-07-11 MED ORDER — LETROZOLE 2.5 MG PO TABS: 2.5000 mg | ORAL_TABLET | Freq: Every day | ORAL | 3 refills | Status: DC

## 2017-07-11 MED ORDER — SODIUM CHLORIDE 0.9 % IV SOLN
Freq: Once | INTRAVENOUS | Status: AC
  Administered 2017-07-11: 12:00:00 via INTRAVENOUS

## 2017-07-11 MED ORDER — TRASTUZUMAB CHEMO 150 MG IV SOLR
300.0000 mg | Freq: Once | INTRAVENOUS | Status: AC
  Administered 2017-07-11: 300 mg via INTRAVENOUS
  Filled 2017-07-11: qty 14.29

## 2017-07-11 MED ORDER — HEPARIN SOD (PORK) LOCK FLUSH 100 UNIT/ML IV SOLN
500.0000 [IU] | Freq: Once | INTRAVENOUS | Status: AC | PRN
  Administered 2017-07-11: 500 [IU]
  Filled 2017-07-11: qty 5

## 2017-07-11 MED ORDER — ACETAMINOPHEN 325 MG PO TABS
ORAL_TABLET | ORAL | Status: AC
  Filled 2017-07-11: qty 2

## 2017-07-11 MED ORDER — SODIUM CHLORIDE 0.9% FLUSH
10.0000 mL | INTRAVENOUS | Status: DC | PRN
  Administered 2017-07-11: 10 mL via INTRAVENOUS
  Filled 2017-07-11: qty 10

## 2017-07-11 MED ORDER — DIPHENHYDRAMINE HCL 25 MG PO CAPS
50.0000 mg | ORAL_CAPSULE | Freq: Once | ORAL | Status: AC
  Administered 2017-07-11: 50 mg via ORAL

## 2017-07-11 MED ORDER — DIPHENHYDRAMINE HCL 25 MG PO CAPS
ORAL_CAPSULE | ORAL | Status: AC
  Filled 2017-07-11: qty 2

## 2017-07-11 NOTE — Assessment & Plan Note (Signed)
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 Neoadjuvant chemotherapy with TCH Perjeta 6 started 10/11/2016 completed 01/24/2017 followed by Herceptinmaintenance for 1 year 2.Breast conserving surgery on 03/05/2017 3. Followed by adjuvant radiation therapy 05/21/2017-06/09/2017 4. Followed by adjuvant antiestrogen therapy ---------------------------------------------------------------------- Current treatment: Herceptin maintenance Recommendation: Patient will start antiestrogen therapy with letrozole 2.5 mg daily starting today.  Letrozole counseling:We discussed the risks and benefits of anti-estrogen therapy with aromatase inhibitors. These include but not limited to insomnia, hot flashes, mood changes, vaginal dryness, bone density loss, and weight gain. We strongly believe that the benefits far outweigh the risks. Patient understands these risks and consented to starting treatment. Planned treatment duration is 5 years.  Return to clinic every 3 weeks for Herceptin every 6 weeks for follow-up with me

## 2017-07-11 NOTE — Progress Notes (Signed)
Patient Care Team: Lin Landsman, MD as PCP - General (Family Medicine) [name], MD as Consulting Physician (General Surgery) Nicholas Lose, MD as Consulting Physician (Hematology and Oncology) Eppie Gibson, MD as Attending Physician (Radiation Oncology) Gardenia Phlegm, NP as Nurse Practitioner (Hematology and Oncology)  DIAGNOSIS:  Encounter Diagnosis  Name Primary?  . Malignant neoplasm of upper-inner quadrant of left breast in female, estrogen receptor positive ([place])     SUMMARY OF ONCOLOGIC HISTORY:   Malignant neoplasm of upper-inner quadrant of left breast in female, estrogen receptor positive ([place])   09/14/2016 Initial Diagnosis    Palpable left breast mass for 2 months, ultrasound at 9:00: 4.7 x 3.6 x 3.3 cm, axilla negative: U/S Bx: Grade 2 IDC ER 95%, PR 50%, HER-2 positive ratio 6.17, Ki-67 20%, T2 N0 stage IIA clinical stage      09/27/2016 Breast MRI    Left breast: 3.5 cm irregular necrotic mass; left axillary lymph node 1.2 cm; right breast 0.6 cm mass at 6:00 position 0.5 cm mass right breast lower inner quadrant       10/11/2016 - 01/24/2017 Neo-Adjuvant Chemotherapy    Taxotere, Carboplatin, Herceptin, Perjeta x 6 cycles followed by Herceptin and Perjeta maintenance for one year      01/26/2017 Breast MRI    Left breast: Irregular enhancing mass measuring 2.7 cm, smaller than previous, no abnormal lymph nodes; right breast: No mass or enhancement       03/05/2017 Surgery    Left lumpectomy: IDC grade 2, 2.5 cm, margins negative, 0/2 lymph nodes negative, ER 100%, PR 50%, HER-2 positive ratio 4.88, T2 N0 stage IB AJCC 8       04/17/2017 Mammogram    2 groups of suspicious residual calcifications in the left breast spanning 4.8 cm one is anterior to lumpectomy the other is medial aspect of lumpectomy      05/21/2017 - 06/15/2017 Radiation Therapy    Adjuvant radiation therapy      07/11/2017 -  Anti-estrogen oral therapy    Letrozole 2.5  mg daily       CHIEF COMPLIANT: Follow-up to discuss starting antiestrogen treatment, Herceptin therapy  INTERVAL HISTORY: Miranda Woods is a 61 year old with above-mentioned history of left breast cancer treated with lumpectomy and radiation.  She is currently on Herceptin maintenance.  She has been tolerating Herceptin extremely well.  She has no issues or concerns.  She is here to discuss starting antiestrogen therapy.  REVIEW OF SYSTEMS:   Constitutional: Denies fevers, chills or abnormal weight loss Eyes: Denies blurriness of vision Ears, nose, mouth, throat, and face: Denies mucositis or sore throat Respiratory: Denies cough, dyspnea or wheezes Cardiovascular: Denies palpitation, chest discomfort Gastrointestinal:  Denies nausea, heartburn or change in bowel habits Skin: Denies abnormal skin rashes Lymphatics: Denies new lymphadenopathy or easy bruising Neurological:Denies numbness, tingling or new weaknesses Behavioral/Psych: Mood is stable, no new changes  Extremities: No lower extremity edema Breast:  denies any pain or lumps or nodules in either breasts All other systems were reviewed with the patient and are negative.  I have reviewed the past medical history, past surgical history, social history and family history with the patient and they are unchanged from previous note.  ALLERGIES:  is allergic to codeine.  MEDICATIONS:  Current Outpatient Medications  Medication Sig Dispense Refill  . Eszopiclone 3 MG TABS Take 1 tablet (3 mg total) by mouth at bedtime as needed for sleep. Take immediately before bedtime 30 tablet 5  .  letrozole (FEMARA) 2.5 MG tablet Take 1 tablet (2.5 mg total) by mouth daily. 90 tablet 3  . Melatonin 5 MG TABS Take 1 tablet (5 mg total) by mouth at bedtime as needed. 90 tablet 3  . triamcinolone cream (KENALOG) 0.5 % Apply 1 application topically 2 (two) times daily. (Patient not taking: Reported on 05/30/2017) 30 g 0  . UNABLE TO FIND Med  Name: Tonic Alcemy. One scoop every morning     No current facility-administered medications for this visit.    Facility-Administered Medications Ordered in Other Visits  Medication Dose Route Frequency Provider Last Rate Last Dose  . heparin lock flush 100 unit/mL  500 Units Intracatheter Once PRN Nicholas Lose, MD      . sodium chloride flush (NS) 0.9 % injection 10 mL  10 mL Intravenous PRN Nicholas Lose, MD   10 mL at 01/24/17 1116  . sodium chloride flush (NS) 0.9 % injection 10 mL  10 mL Intravenous PRN Nicholas Lose, MD   10 mL at 02/14/17 1236  . sodium chloride flush (NS) 0.9 % injection 10 mL  10 mL Intracatheter PRN Nicholas Lose, MD      . trastuzumab (HERCEPTIN) 300 mg in sodium chloride 0.9 % 250 mL chemo infusion  300 mg Intravenous Once Nicholas Lose, MD        PHYSICAL EXAMINATION: ECOG PERFORMANCE STATUS: 0 - Asymptomatic  Vitals:   07/11/17 1038  BP: 120/75  Pulse: 77  Resp: 18  Temp: 97.8 F (36.6 C)  SpO2: 100%   Filed Weights   07/11/17 1038  Weight: 118 lb 12.8 oz (53.9 kg)    GENERAL:alert, no distress and comfortable SKIN: skin color, texture, turgor are normal, no rashes or significant lesions EYES: normal, Conjunctiva are pink and non-injected, sclera clear OROPHARYNX:no exudate, no erythema and lips, buccal mucosa, and tongue normal  NECK: supple, thyroid normal size, non-tender, without nodularity LYMPH:  no palpable lymphadenopathy in the cervical, axillary or inguinal LUNGS: clear to auscultation and percussion with normal breathing effort HEART: regular rate & rhythm and no murmurs and no lower extremity edema ABDOMEN:abdomen soft, non-tender and normal bowel sounds MUSCULOSKELETAL:no cyanosis of digits and no clubbing  NEURO: alert & oriented x 3 with fluent speech, no focal motor/sensory deficits EXTREMITIES: No lower extremity edema BREAST: No palpable masses or nodules in either right or left breasts. No palpable axillary supraclavicular  or infraclavicular adenopathy no breast tenderness or nipple discharge. (exam performed in the presence of a chaperone)  LABORATORY DATA:  I have reviewed the data as listed   Chemistry      Component Value Date/Time   NA 140 07/11/2017 1015   K 4.0 07/11/2017 1015   CL 101 11/30/2011 1649   CO2 24 07/11/2017 1015   BUN 14.2 07/11/2017 1015   CREATININE 0.6 07/11/2017 1015      Component Value Date/Time   CALCIUM 9.0 07/11/2017 1015   ALKPHOS 66 07/11/2017 1015   AST 14 07/11/2017 1015   ALT 13 07/11/2017 1015   BILITOT 0.50 07/11/2017 1015       Lab Results  Component Value Date   WBC 2.5 (L) 07/11/2017   HGB 12.8 07/11/2017   HCT 37.8 07/11/2017   MCV 93.3 07/11/2017   PLT 192 07/11/2017   NEUTROABS 1.3 (L) 07/11/2017    ASSESSMENT & PLAN:  Malignant neoplasm of upper-inner quadrant of left breast in female, estrogen receptor positive ([place]) Palpable left breast mass for 2 months, ultrasound at 9:00:  4.7 x 3.6 x 3.3 cm, axilla negative: U/S Bx: Grade 2 IDC ER 95%, PR 50%, HER-2 positive ratio 6.17, Ki-67 20%,  Right breast biopsy: PASH  03/05/2017 Left lumpectomy: IDC grade 2, 2.5 cm, margins negative, 0/2 lymph nodes negative, ER 100%, PR 50%, HER-2 positive ratio 4.88, T2 N0 stage IB AJCC 8  Treatment summary 1. Neoadjuvant chemotherapy with TCH Perjeta 6 started 10/11/2016 completed 01/24/2017 followed by Herceptinmaintenance for 1 year 2.Breast conserving surgery on 03/05/2017 3. Followed by adjuvant radiation therapy 05/21/2017-06/09/2017 4. Followed by adjuvant antiestrogen therapy ---------------------------------------------------------------------- Current treatment: Herceptin maintenance Recommendation: Patient will start antiestrogen therapy with letrozole 2.5 mg daily starting today.  Letrozole counseling:We discussed the risks and benefits of anti-estrogen therapy with aromatase inhibitors. These include but not limited to insomnia, hot flashes,  mood changes, vaginal dryness, bone density loss, and weight gain. We strongly believe that the benefits far outweigh the risks. Patient understands these risks and consented to starting treatment. Planned treatment duration is 5 years.  Return to clinic every 3 weeks for Herceptin every 6 weeks for follow-up with me Patient tells me that she is planning to go to [place] and immersed herself in their culture in May 2019.  I spent 25 minutes talking to the patient of which more than half was spent in counseling and coordination of care.  Orders Placed This Encounter  Procedures  . MM DIAG BREAST TOMO BILATERAL    Standing Status:   Future    Standing Expiration Date:   07/11/2018    Order Specific Question:   Reason for Exam (SYMPTOM  OR DIAGNOSIS REQUIRED)    Answer:   Annual mammogram for breast cancer    Order Specific Question:   Is the patient pregnant?    Answer:   No    Order Specific Question:   Preferred imaging location?    Answer:   [facility]   The patient has a good understanding of the overall plan. she agrees with it. she will call with any problems that may develop before the next visit here.   Rulon Eisenmenger, MD 07/11/17

## 2017-07-11 NOTE — Patient Instructions (Signed)
[facility] Discharge Instructions for Patients Receiving Chemotherapy  Today you received the following chemotherapy agents Herceptin  To help prevent nausea and vomiting after your treatment, we encourage you to take your nausea medication as directed   If you develop nausea and vomiting that is not controlled by your nausea medication, call the clinic.   BELOW ARE SYMPTOMS THAT SHOULD BE REPORTED IMMEDIATELY:  *FEVER GREATER THAN 100.5 F  *CHILLS WITH OR WITHOUT FEVER  NAUSEA AND VOMITING THAT IS NOT CONTROLLED WITH YOUR NAUSEA MEDICATION  *UNUSUAL SHORTNESS OF BREATH  *UNUSUAL BRUISING OR BLEEDING  TENDERNESS IN MOUTH AND THROAT WITH OR WITHOUT PRESENCE OF ULCERS  *URINARY PROBLEMS  *BOWEL PROBLEMS  UNUSUAL RASH Items with * indicate a potential emergency and should be followed up as soon as possible.  Feel free to call the clinic should you have any questions or concerns. The clinic phone number is [phone].  Please show the CHEMO ALERT CARD at check-in to the Emergency Department and triage nurse.   

## 2017-07-12 ENCOUNTER — Encounter: Payer: Self-pay | Admitting: *Deleted

## 2017-07-19 ENCOUNTER — Encounter: Payer: Self-pay | Admitting: Radiation Oncology

## 2017-07-20 ENCOUNTER — Ambulatory Visit: Payer: Self-pay | Admitting: Radiation Oncology

## 2017-07-25 ENCOUNTER — Telehealth: Payer: Self-pay | Admitting: *Deleted

## 2017-07-25 ENCOUNTER — Ambulatory Visit
Admission: RE | Admit: 2017-07-25 | Discharge: 2017-07-25 | Disposition: A | Discharge status: Home / Self Care | Payer: BLUE CROSS/BLUE SHIELD | Source: Ambulatory Visit | Attending: Radiation Oncology | Admitting: Radiation Oncology

## 2017-07-25 ENCOUNTER — Encounter: Payer: Self-pay | Admitting: Radiation Oncology

## 2017-07-25 VITALS — BP 108/76 | HR 69 | Temp 97.9°F | Ht 65.0 in | Wt 118.8 lb

## 2017-07-25 DIAGNOSIS — Z923 Personal history of irradiation: Secondary | ICD-10-CM | POA: Insufficient documentation

## 2017-07-25 DIAGNOSIS — C50212 Malignant neoplasm of upper-inner quadrant of left female breast: Secondary | ICD-10-CM | POA: Insufficient documentation

## 2017-07-25 DIAGNOSIS — Z170 Estrogen receptor positive status [ER+]: Secondary | ICD-10-CM | POA: Diagnosis not present

## 2017-07-25 DIAGNOSIS — F529 Unspecified sexual dysfunction not due to a substance or known physiological condition: Secondary | ICD-10-CM

## 2017-07-25 DIAGNOSIS — Z79811 Long term (current) use of aromatase inhibitors: Secondary | ICD-10-CM | POA: Diagnosis not present

## 2017-07-25 HISTORY — DX: Personal history of irradiation: Z92.3

## 2017-07-25 MED ORDER — RADIAPLEXRX EX GEL
Freq: Once | CUTANEOUS | Status: AC
  Administered 2017-07-25: 13:00:00 via TOPICAL

## 2017-07-25 NOTE — Telephone Encounter (Signed)
CALLED PATIENT TO INFORM OF PT APPT. FOR 07-27-17- ARRIVAL TIME - 8:30 AM @ [facility] @ [place], LVM FOR A RETURN CALL

## 2017-07-25 NOTE — Addendum Note (Signed)
Encounter addended by: Yuleni Burich, Stephani Police, RN on: 07/25/2017 1:30 PM  Actions taken: Charge Capture section accepted

## 2017-07-25 NOTE — Progress Notes (Signed)
Ms. Gritz presents for follow up of radiation completed 06/15/17 to her Left Breast. She denies pain and fatigue. Her skin is slightly red. She has continued to use Radiaplex to this area. She has used radiaplex to other areas of her skin which have been dry since completing radiation and chemotherapy. She will continue taking herceptin and letrozole.   BP 108/76   Pulse 69   Temp 97.9 F (36.6 C)   Ht 5\' 5"  (1.651 m)   Wt 118 lb 12.8 oz (53.9 kg)   LMP 11/28/2011   SpO2 95% Comment: room air  BMI 19.77 kg/m    Wt Readings from Last 3 Encounters:  07/25/17 118 lb 12.8 oz (53.9 kg)  07/11/17 118 lb 12.8 oz (53.9 kg)  06/20/17 116 lb 8 oz (52.8 kg)

## 2017-07-25 NOTE — Progress Notes (Signed)
Radiation Oncology         [phone] ________________________________  Name: Miranda Woods MRN: [id number]  Date: 07/25/2017  DOB: 06-16-1956  Follow-Up Visit Note  Outpatient  CC: Lin Landsman, MD  Nicholas Lose, MD  Diagnosis and Prior Radiotherapy:    ICD-10-CM   1. Malignant neoplasm of upper-inner quadrant of left breast in female, estrogen receptor positive ([place]) C50.212 hyaluronate sodium (RADIAPLEXRX) gel   Z17.0 Ambulatory referral to Physical Therapy  2. 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 She denies any pain or fatigue. Her skin is slightly red. She has continued to use Radiaplex to this area. She has used radiaplex to other areas of her skin which have been dry since completing radiation and chemotherapy. She will continue taking herceptin and letrozole. She reports vaginal dryness, decreased libido, and pain with sex.                               ALLERGIES:  is allergic to codeine.  Meds: Current Outpatient Medications  Medication Sig Dispense Refill  . Eszopiclone 3 MG TABS Take 1 tablet (3 mg total) by mouth at bedtime as needed for sleep. Take immediately before bedtime 30 tablet 5  . letrozole (FEMARA) 2.5 MG tablet Take 1 tablet (2.5 mg total) by mouth daily. 90 tablet 3  . omega-3 acid ethyl esters (LOVAZA) 1 g capsule Take by mouth 2 (two) times daily.    Marland Kitchen UNABLE TO FIND Med Name: Tonic Alcemy. One scoop every morning    . UNABLE TO FIND daily. Med Name: protandium herbal supplement    . UNABLE TO FIND daily. Med Name: tumeric    .  Melatonin 5 MG TABS Take 1 tablet (5 mg total) by mouth at bedtime as needed. (Patient not taking: Reported on 07/25/2017) 90 tablet 3   No current facility-administered medications for this encounter.    Facility-Administered Medications Ordered in Other Encounters  Medication Dose Route Frequency Provider Last Rate Last Dose  . sodium chloride flush (NS) 0.9 % injection 10 mL  10 mL Intravenous PRN Nicholas Lose, MD   10 mL at 01/24/17 1116  . sodium chloride flush (NS) 0.9 % injection 10 mL  10 mL Intravenous PRN Nicholas Lose, MD   10 mL at 02/14/17 1236    Physical Findings:  height is 5\' 5"  (1.651 m) and weight is 118 lb 12.8 oz (53.9 kg). Her temperature is 97.9 F (36.6 C). Her blood pressure is 108/76 and her pulse is 69. Her oxygen saturation is 95%. .    General: Alert and oriented, in no acute distress HEENT: Head is normocephalic. Extraocular movements are intact. Skin: No concerning lesions. Psychiatric: Judgment and insight are intact. Affect is appropriate. Breast: Very slight amount of erythema on the left breast and the left breast has some mild swelling.   Lab Findings: Lab Results  Component Value Date   WBC 2.5 (L) 07/11/2017   HGB 12.8 07/11/2017   HCT 37.8 07/11/2017  MCV 93.3 07/11/2017   PLT 192 07/11/2017    Radiographic Findings: No results found.  Impression/Plan: healing well. Would benefit from PT.   Radiaplex refilled today at the patient's request. I have also made a referral to physical therapy for sex-related symptoms post-chemo and radiation treatment. I encouraged her to continue with yearly mammography and followup with medical oncology. I will see her back on an as-needed basis. I have encouraged her to call if she has any issues or concerns in the future. I wished her the very best.     Eppie Gibson, MD   This document serves as a record of services personally performed by Eppie Gibson, MD. It was created on her behalf by Arlyce Harman, a trained medical scribe. The creation of this record is based on the scribe's personal observations and the provider's statements to them. This document has been checked and approved by the attending provider.

## 2017-07-27 ENCOUNTER — Ambulatory Visit: Payer: BLUE CROSS/BLUE SHIELD | Admitting: Physical Therapy

## 2017-08-01 ENCOUNTER — Ambulatory Visit (HOSPITAL_BASED_OUTPATIENT_CLINIC_OR_DEPARTMENT_OTHER): Payer: BLUE CROSS/BLUE SHIELD

## 2017-08-01 VITALS — BP 105/71 | HR 74 | Temp 98.2°F | Resp 18

## 2017-08-01 DIAGNOSIS — Z170 Estrogen receptor positive status [ER+]: Principal | ICD-10-CM

## 2017-08-01 DIAGNOSIS — C50212 Malignant neoplasm of upper-inner quadrant of left female breast: Secondary | ICD-10-CM

## 2017-08-01 DIAGNOSIS — Z5112 Encounter for antineoplastic immunotherapy: Secondary | ICD-10-CM | POA: Diagnosis not present

## 2017-08-01 MED ORDER — DIPHENHYDRAMINE HCL 25 MG PO CAPS
50.0000 mg | ORAL_CAPSULE | Freq: Once | ORAL | Status: AC
  Administered 2017-08-01: 50 mg via ORAL

## 2017-08-01 MED ORDER — ACETAMINOPHEN 325 MG PO TABS
ORAL_TABLET | ORAL | Status: AC
  Filled 2017-08-01: qty 2

## 2017-08-01 MED ORDER — HEPARIN SOD (PORK) LOCK FLUSH 100 UNIT/ML IV SOLN
500.0000 [IU] | Freq: Once | INTRAVENOUS | Status: AC | PRN
  Administered 2017-08-01: 500 [IU]
  Filled 2017-08-01: qty 5

## 2017-08-01 MED ORDER — TRASTUZUMAB CHEMO 150 MG IV SOLR
300.0000 mg | Freq: Once | INTRAVENOUS | Status: AC
  Administered 2017-08-01: 300 mg via INTRAVENOUS
  Filled 2017-08-01: qty 14.29

## 2017-08-01 MED ORDER — DIPHENHYDRAMINE HCL 25 MG PO CAPS
ORAL_CAPSULE | ORAL | Status: AC
  Filled 2017-08-01: qty 2

## 2017-08-01 MED ORDER — ACETAMINOPHEN 325 MG PO TABS
650.0000 mg | ORAL_TABLET | Freq: Once | ORAL | Status: AC
  Administered 2017-08-01: 650 mg via ORAL

## 2017-08-01 MED ORDER — SODIUM CHLORIDE 0.9 % IV SOLN
Freq: Once | INTRAVENOUS | Status: AC
Start: 2017-08-01 — End: 2017-08-01
  Administered 2017-08-01: 11:00:00 via INTRAVENOUS

## 2017-08-01 MED ORDER — SODIUM CHLORIDE 0.9% FLUSH
10.0000 mL | INTRAVENOUS | Status: DC | PRN
  Administered 2017-08-01: 10 mL
  Filled 2017-08-01: qty 10

## 2017-08-01 NOTE — Patient Instructions (Signed)
[facility] Discharge Instructions for Patients Receiving Chemotherapy  Today you received the following chemotherapy agents Herceptin  To help prevent nausea and vomiting after your treatment, we encourage you to take your nausea medication as directed   If you develop nausea and vomiting that is not controlled by your nausea medication, call the clinic.   BELOW ARE SYMPTOMS THAT SHOULD BE REPORTED IMMEDIATELY:  *FEVER GREATER THAN 100.5 F  *CHILLS WITH OR WITHOUT FEVER  NAUSEA AND VOMITING THAT IS NOT CONTROLLED WITH YOUR NAUSEA MEDICATION  *UNUSUAL SHORTNESS OF BREATH  *UNUSUAL BRUISING OR BLEEDING  TENDERNESS IN MOUTH AND THROAT WITH OR WITHOUT PRESENCE OF ULCERS  *URINARY PROBLEMS  *BOWEL PROBLEMS  UNUSUAL RASH Items with * indicate a potential emergency and should be followed up as soon as possible.  Feel free to call the clinic should you have any questions or concerns. The clinic phone number is [phone].  Please show the CHEMO ALERT CARD at check-in to the Emergency Department and triage nurse.   

## 2017-08-08 ENCOUNTER — Ambulatory Visit (HOSPITAL_BASED_OUTPATIENT_CLINIC_OR_DEPARTMENT_OTHER)
Admission: RE | Admit: 2017-08-08 | Discharge: 2017-08-08 | Disposition: A | Discharge status: Home / Self Care | Payer: BLUE CROSS/BLUE SHIELD | Source: Ambulatory Visit | Attending: Internal Medicine | Admitting: Internal Medicine

## 2017-08-08 ENCOUNTER — Encounter (HOSPITAL_COMMUNITY): Payer: Self-pay | Admitting: Internal Medicine

## 2017-08-08 ENCOUNTER — Ambulatory Visit (HOSPITAL_COMMUNITY)
Admission: RE | Admit: 2017-08-08 | Discharge: 2017-08-08 | Disposition: A | Discharge status: Home / Self Care | Payer: BLUE CROSS/BLUE SHIELD | Source: Ambulatory Visit | Attending: Family Medicine | Admitting: Family Medicine

## 2017-08-08 VITALS — BP 114/62 | HR 58 | Wt 118.8 lb

## 2017-08-08 DIAGNOSIS — Z1501 Genetic susceptibility to malignant neoplasm of breast: Secondary | ICD-10-CM | POA: Diagnosis not present

## 2017-08-08 DIAGNOSIS — C50212 Malignant neoplasm of upper-inner quadrant of left female breast: Secondary | ICD-10-CM

## 2017-08-08 DIAGNOSIS — Z87891 Personal history of nicotine dependence: Secondary | ICD-10-CM | POA: Insufficient documentation

## 2017-08-08 DIAGNOSIS — Z79899 Other long term (current) drug therapy: Secondary | ICD-10-CM | POA: Diagnosis not present

## 2017-08-08 DIAGNOSIS — Z8349 Family history of other endocrine, nutritional and metabolic diseases: Secondary | ICD-10-CM | POA: Insufficient documentation

## 2017-08-08 DIAGNOSIS — Z885 Allergy status to narcotic agent status: Secondary | ICD-10-CM | POA: Insufficient documentation

## 2017-08-08 DIAGNOSIS — Z8249 Family history of ischemic heart disease and other diseases of the circulatory system: Secondary | ICD-10-CM | POA: Diagnosis not present

## 2017-08-08 DIAGNOSIS — Z170 Estrogen receptor positive status [ER+]: Secondary | ICD-10-CM

## 2017-08-08 DIAGNOSIS — Z853 Personal history of malignant neoplasm of breast: Secondary | ICD-10-CM | POA: Diagnosis not present

## 2017-08-08 DIAGNOSIS — F909 Attention-deficit hyperactivity disorder, unspecified type: Secondary | ICD-10-CM | POA: Diagnosis not present

## 2017-08-08 DIAGNOSIS — N6314 Unspecified lump in the right breast, lower inner quadrant: Secondary | ICD-10-CM | POA: Diagnosis not present

## 2017-08-08 DIAGNOSIS — Z9221 Personal history of antineoplastic chemotherapy: Secondary | ICD-10-CM | POA: Insufficient documentation

## 2017-08-08 DIAGNOSIS — Z923 Personal history of irradiation: Secondary | ICD-10-CM | POA: Insufficient documentation

## 2017-08-08 LAB — ECHOCARDIOGRAM COMPLETE: WEIGHTICAEL: 1900.8 [oz_av]

## 2017-08-08 NOTE — Progress Notes (Signed)
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 Lindi Adie for enrollment into the Cardio-Oncology program.   SUMMARY OF ONCOLOGIC HISTORY:       Malignant neoplasm of upper-inner quadrant of left breast in female, estrogen receptor positive ([place])   09/14/2016 Initial Diagnosis    Palpable left breast mass for 2 months, ultrasound at 9:00: 4.7 x 3.6 x 3.3 cm, axilla negative: U/S Bx: Grade 2 IDC ER 95%, PR 50%, HER-2 positive ratio 6.17, Ki-67 20%, T2 N0 stage IIA clinical stage      09/27/2016 Breast MRI    Left breast: 3.5 cm irregular necrotic mass; left axillary lymph node 1.2 cm; right breast 0.6 cm mass at 6:00 position 0.5 cm mass right breast lower inner quadrant       10/11/2016 - 01/24/2017 Neo-Adjuvant Chemotherapy    Taxotere, Carboplatin, Herceptin, Perjeta x 6 cycles followed by Herceptin and Perjeta maintenance for one year      01/26/2017 Breast MRI    Left breast: Irregular enhancing mass measuring 2.7 cm, smaller than previous, no abnormal lymph nodes; right breast: No mass or enhancement       03/05/2017 Surgery    Left lumpectomy: IDC grade 2, 2.5 cm, margins negative, 0/2 lymph nodes negative, ER 100%, PR 50%, HER-2 positive ratio 4.88, T2 N0 stage IB AJCC 8       04/17/2017 Mammogram    2 groups of suspicious residual calcifications in the left breast spanning 4.8 cm one is anterior to lumpectomy the other is medial aspect of lumpectomy      05/21/2017 - 06/15/2017 Radiation Therapy    Adjuvant radiation therapy      07/11/2017 -  Anti-estrogen oral therapy    Letrozole 2.5 mg daily       Has completed TCH-P of neo-adjuvant chemo, lumpectomy and XRT. Feeling great. Exercising and meditating. Feels great. 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   Father died at 78  from [place]  Brother died 31 from MI (non-smoker; h/o substance abuse)   ECHO: 01/08/2017 EF 60-65% LS' 10.8 cm/s GLS -22.7%     ECHO 08/08/17 EF 65% LS' 13.4 cm/s GLS -19.5% Personally reviewed    Past Medical History:  Diagnosis Date  . ADHD (attention deficit hyperactivity disorder)   . Breast cancer ([place])    left 2018  . Cancer ([place]) 12/2016   left breast cancer  . Fibrocystic breast disease   . History of radiation therapy 05/21/17- 06/15/17   Left Breast/ 40.05 Gy in 15 fractions, Left Breast Boost/ 10 Gy in 5 fractions.   . Personal history of chemotherapy     Current Outpatient Medications  Medication Sig Dispense Refill  . Eszopiclone 3 MG TABS Take 1 tablet (3 mg total) by mouth at bedtime as needed for sleep. Take immediately before bedtime 30 tablet 5  . letrozole (FEMARA) 2.5 MG tablet Take 1 tablet (2.5 mg total) by mouth daily. 90 tablet 3  . Melatonin 5 MG TABS Take 1 tablet (5 mg total) by mouth at bedtime as needed. 90 tablet 3  . omega-3 acid ethyl esters (LOVAZA) 1 g capsule Take by mouth 2 (two) times daily.    Marland Kitchen UNABLE TO FIND Med Name: Tonic Alcemy. One scoop every morning    . UNABLE TO FIND daily. Med Name: protandium herbal supplement    .  UNABLE TO FIND daily. Med Name: tumeric     No current facility-administered medications for this encounter.    Facility-Administered Medications Ordered in Other Encounters  Medication Dose Route Frequency Provider Last Rate Last Dose  . sodium chloride flush (NS) 0.9 % injection 10 mL  10 mL Intravenous PRN Nicholas Lose, MD   10 mL at 01/24/17 1116  . sodium chloride flush (NS) 0.9 % injection 10 mL  10 mL Intravenous PRN Nicholas Lose, MD   10 mL at 02/14/17 1236    Allergies  Allergen Reactions  . Codeine Hives and Itching    Tolerates hydrocodone      Social History   Socioeconomic History  . Marital status: Single    Spouse name: [name]  . Number of children: 0  . Years of education: Not on file  .  Highest education level: Not on file  Social Needs  . Financial resource strain: Not on file  . Food insecurity - worry: Not on file  . Food insecurity - inability: Not on file  . Transportation needs - medical: Not on file  . Transportation needs - non-medical: Not on file  Occupational History  . Occupation: counselor  Tobacco Use  . Smoking status: Former Smoker    Types: Cigarettes  . Smokeless tobacco: Never Used  Substance and Sexual Activity  . Alcohol use: No    Alcohol/week: 0.0 oz    Comment: ocassional wine  . Drug use: No  . Sexual activity: Yes    Partners: Male    Birth control/protection: Post-menopausal  Other Topics Concern  . Not on file  Social History Narrative  . Not on file      Family History  Problem Relation Age of Onset  . Heart disease Father   . Hypertension Father   . Thyroid disease Mother     Vitals:   08/08/17 1129  BP: 114/62  Pulse: (!) 58  SpO2: 99%  Weight: 118 lb 12.8 oz (53.9 kg)    PHYSICAL EXAM: General:  Well appearing. No resp difficulty HEENT: normal Neck: supple. no JVD. Carotids 2+ bilat; no bruits. No lymphadenopathy or thryomegaly appreciated. Cor: PMI nondisplaced. Regular rate & rhythm. No rubs, gallops or murmurs. R chest port-a-cath Lungs: clear Abdomen: soft, nontender, nondistended. No hepatosplenomegaly. No bruits or masses. Good bowel sounds. Extremities: no cyanosis, clubbing, rash, edema Neuro: alert & orientedx3, cranial nerves grossly intact. moves all 4 extremities w/o difficulty. Affect pleasant   ASSESSMENT & PLAN: 1. Malignant neoplasm of upper-inner quadrant of left breast in female, estrogen receptor positive ([place]) HER-2+ --Palpable left breast mass for 2 months, ultrasound at 9:00: 4.7 x 3.6 x 3.3 cm, axilla negative: U/S Bx: Grade 2 IDC ER 95%, PR 50%, HER-2 positive ratio 6.17, Ki-67 20%,  --T2 N0 stage IIAclinical stage (could be stage IIB if the lymph node in the left axilla is  positive) --I reviewed echos personally. EF and Doppler parameters stable. No HF on exam. Continue Herceptin. Has 3 more treatments left. Can be discharged from cardio-onc program f/u as needed      [name], MD  11:39 AM

## 2017-08-08 NOTE — Progress Notes (Signed)
  Echocardiogram 2D Echocardiogram has been performed.  Merrie Roof F 08/08/2017, 1:04 PM

## 2017-08-08 NOTE — Patient Instructions (Signed)
Follow up as needed

## 2017-08-08 NOTE — Addendum Note (Signed)
Encounter addended by: Scarlette Calico, RN on: 08/08/2017 11:54 AM  Actions taken: Sign clinical note

## 2017-08-21 NOTE — Assessment & Plan Note (Signed)
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 Neoadjuvant chemotherapy with TCH Perjeta 6 started 10/11/2016 completed 01/24/2017 followed by Herceptinmaintenance for 1 year 2.Breast conserving surgery on 03/05/2017 3. Followed by adjuvant radiation therapy 05/21/2017-06/09/2017 4. Followed by adjuvant antiestrogen therapy ---------------------------------------------------------------------- Current treatment: Herceptin maintenance Letrozole Toxicities:  Return to clinic every 3 weeks for Herceptin every 6 weeks for follow-up with me Patient tells me that she is planning to go to [place] and immersed herself in their culture in May 2019.

## 2017-08-22 ENCOUNTER — Inpatient Hospital Stay: Payer: BLUE CROSS/BLUE SHIELD | Attending: Hematology and Oncology | Admitting: Hematology and Oncology

## 2017-08-22 ENCOUNTER — Inpatient Hospital Stay: Payer: BLUE CROSS/BLUE SHIELD

## 2017-08-22 ENCOUNTER — Encounter: Payer: Self-pay | Admitting: *Deleted

## 2017-08-22 DIAGNOSIS — C50212 Malignant neoplasm of upper-inner quadrant of left female breast: Secondary | ICD-10-CM

## 2017-08-22 DIAGNOSIS — Z170 Estrogen receptor positive status [ER+]: Secondary | ICD-10-CM

## 2017-08-22 DIAGNOSIS — Z5112 Encounter for antineoplastic immunotherapy: Secondary | ICD-10-CM | POA: Insufficient documentation

## 2017-08-22 DIAGNOSIS — Z79811 Long term (current) use of aromatase inhibitors: Secondary | ICD-10-CM | POA: Diagnosis not present

## 2017-08-22 DIAGNOSIS — Z95828 Presence of other vascular implants and grafts: Secondary | ICD-10-CM

## 2017-08-22 LAB — CBC WITH DIFFERENTIAL/PLATELET
Basophils Absolute: 0 10*3/uL (ref 0.0–0.1)
Basophils Relative: 0 %
Eosinophils Absolute: 0.1 10*3/uL (ref 0.0–0.5)
Eosinophils Relative: 2 %
HEMATOCRIT: 34.5 % — AB (ref 34.8–46.6)
HEMOGLOBIN: 11.8 g/dL (ref 11.6–15.9)
LYMPHS ABS: 0.9 10*3/uL (ref 0.9–3.3)
LYMPHS PCT: 29 %
MCH: 32 pg (ref 25.1–34.0)
MCHC: 34.2 g/dL (ref 31.5–36.0)
MCV: 93.5 fL (ref 79.5–101.0)
MONOS PCT: 10 %
Monocytes Absolute: 0.3 10*3/uL (ref 0.1–0.9)
NEUTROS ABS: 1.9 10*3/uL (ref 1.5–6.5)
NEUTROS PCT: 59 %
Platelets: 208 10*3/uL (ref 145–400)
RBC: 3.69 MIL/uL — ABNORMAL LOW (ref 3.70–5.45)
RDW: 11.9 % (ref 11.2–16.1)
WBC: 3.2 10*3/uL — ABNORMAL LOW (ref 3.9–10.3)

## 2017-08-22 LAB — COMPREHENSIVE METABOLIC PANEL
ALK PHOS: 64 U/L (ref 40–150)
ALT: 10 U/L (ref 0–55)
ANION GAP: 7 (ref 3–11)
AST: 11 U/L (ref 5–34)
Albumin: 3.5 g/dL (ref 3.5–5.0)
BUN: 16 mg/dL (ref 7–26)
CALCIUM: 8.9 mg/dL (ref 8.4–10.4)
CO2: 28 mmol/L (ref 22–29)
CREATININE: 0.61 mg/dL (ref 0.60–1.10)
Chloride: 104 mmol/L (ref 98–109)
GFR calc non Af Amer: >60 mL/min (ref >60)
Glucose, Bld: 109 mg/dL (ref 70–140)
Potassium: 3.9 mmol/L (ref 3.3–4.7)
Sodium: 139 mmol/L (ref 136–145)
TOTAL PROTEIN: 6.2 g/dL — AB (ref 6.4–8.3)

## 2017-08-22 MED ORDER — ACETAMINOPHEN 325 MG PO TABS
650.0000 mg | ORAL_TABLET | Freq: Once | ORAL | Status: AC
  Administered 2017-08-22: 650 mg via ORAL

## 2017-08-22 MED ORDER — SODIUM CHLORIDE 0.9% FLUSH
10.0000 mL | INTRAVENOUS | Status: DC | PRN
  Administered 2017-08-22: 10 mL via INTRAVENOUS
  Filled 2017-08-22: qty 10

## 2017-08-22 MED ORDER — LIDOCAINE-PRILOCAINE 2.5-2.5 % EX CREA
TOPICAL_CREAM | CUTANEOUS | Status: AC
  Filled 2017-08-22: qty 5

## 2017-08-22 MED ORDER — TRASTUZUMAB CHEMO 150 MG IV SOLR
300.0000 mg | Freq: Once | INTRAVENOUS | Status: AC
  Administered 2017-08-22: 300 mg via INTRAVENOUS
  Filled 2017-08-22: qty 14.29

## 2017-08-22 MED ORDER — SODIUM CHLORIDE 0.9% FLUSH
10.0000 mL | INTRAVENOUS | Status: DC | PRN
  Administered 2017-08-22: 10 mL
  Filled 2017-08-22: qty 10

## 2017-08-22 MED ORDER — HEPARIN SOD (PORK) LOCK FLUSH 100 UNIT/ML IV SOLN
500.0000 [IU] | Freq: Once | INTRAVENOUS | Status: AC | PRN
  Administered 2017-08-22: 500 [IU]
  Filled 2017-08-22: qty 5

## 2017-08-22 MED ORDER — DIPHENHYDRAMINE HCL 25 MG PO CAPS
50.0000 mg | ORAL_CAPSULE | Freq: Once | ORAL | Status: AC
  Administered 2017-08-22: 25 mg via ORAL

## 2017-08-22 MED ORDER — DIPHENHYDRAMINE HCL 25 MG PO CAPS
ORAL_CAPSULE | ORAL | Status: AC
  Filled 2017-08-22: qty 2

## 2017-08-22 MED ORDER — ACETAMINOPHEN 325 MG PO TABS
ORAL_TABLET | ORAL | Status: AC
  Filled 2017-08-22: qty 2

## 2017-08-22 MED ORDER — SODIUM CHLORIDE 0.9 % IV SOLN
Freq: Once | INTRAVENOUS | Status: AC
Start: 2017-08-22 — End: 2017-08-22
  Administered 2017-08-22: 15:00:00 via INTRAVENOUS

## 2017-08-22 NOTE — Patient Instructions (Signed)
[facility] Discharge Instructions for Patients Receiving Chemotherapy  Today you received the following chemotherapy agents Herceptin  To help prevent nausea and vomiting after your treatment, we encourage you to take your nausea medication as directed   If you develop nausea and vomiting that is not controlled by your nausea medication, call the clinic.   BELOW ARE SYMPTOMS THAT SHOULD BE REPORTED IMMEDIATELY:  *FEVER GREATER THAN 100.5 F  *CHILLS WITH OR WITHOUT FEVER  NAUSEA AND VOMITING THAT IS NOT CONTROLLED WITH YOUR NAUSEA MEDICATION  *UNUSUAL SHORTNESS OF BREATH  *UNUSUAL BRUISING OR BLEEDING  TENDERNESS IN MOUTH AND THROAT WITH OR WITHOUT PRESENCE OF ULCERS  *URINARY PROBLEMS  *BOWEL PROBLEMS  UNUSUAL RASH Items with * indicate a potential emergency and should be followed up as soon as possible.  Feel free to call the clinic should you have any questions or concerns. The clinic phone number is [phone].  Please show the CHEMO ALERT CARD at check-in to the Emergency Department and triage nurse.   

## 2017-08-22 NOTE — Progress Notes (Signed)
Patient Care Team: Lin Landsman, MD as PCP - General (Family Medicine) [name], MD as Consulting Physician (General Surgery) Nicholas Lose, MD as Consulting Physician (Hematology and Oncology) Eppie Gibson, MD as Attending Physician (Radiation Oncology) Gardenia Phlegm, NP as Nurse Practitioner (Hematology and Oncology)  DIAGNOSIS:  Encounter Diagnosis  Name Primary?  . Malignant neoplasm of upper-inner quadrant of left breast in female, estrogen receptor positive ([place])     SUMMARY OF ONCOLOGIC HISTORY:   Malignant neoplasm of upper-inner quadrant of left breast in female, estrogen receptor positive ([place])   09/14/2016 Initial Diagnosis    Palpable left breast mass for 2 months, ultrasound at 9:00: 4.7 x 3.6 x 3.3 cm, axilla negative: U/S Bx: Grade 2 IDC ER 95%, PR 50%, HER-2 positive ratio 6.17, Ki-67 20%, T2 N0 stage IIA clinical stage      09/27/2016 Breast MRI    Left breast: 3.5 cm irregular necrotic mass; left axillary lymph node 1.2 cm; right breast 0.6 cm mass at 6:00 position 0.5 cm mass right breast lower inner quadrant       10/11/2016 - 01/24/2017 Neo-Adjuvant Chemotherapy    Taxotere, Carboplatin, Herceptin, Perjeta x 6 cycles followed by Herceptin and Perjeta maintenance for one year      01/26/2017 Breast MRI    Left breast: Irregular enhancing mass measuring 2.7 cm, smaller than previous, no abnormal lymph nodes; right breast: No mass or enhancement       03/05/2017 Surgery    Left lumpectomy: IDC grade 2, 2.5 cm, margins negative, 0/2 lymph nodes negative, ER 100%, PR 50%, HER-2 positive ratio 4.88, T2 N0 stage IB AJCC 8       04/17/2017 Mammogram    2 groups of suspicious residual calcifications in the left breast spanning 4.8 cm one is anterior to lumpectomy the other is medial aspect of lumpectomy      05/21/2017 - 06/15/2017 Radiation Therapy    Adjuvant radiation therapy      07/11/2017 -  Anti-estrogen oral therapy    Letrozole 2.5  mg daily       CHIEF COMPLIANT: Follow-up on letrozole and Herceptin  INTERVAL HISTORY: Miranda Woods is a 62 year old with above-mentioned history of left breast cancer treated with lumpectomy after undergoing neoadjuvant chemotherapy.  She finished radiation therapy and is currently on letrozole.  She is also on Herceptin maintenance which will complete after the next 2 doses.  She has been tolerating letrozole extremely well without any hot flashes or arthralgias or myalgias.  She has no problems tolerating Herceptin.  Her biggest complaint is related to insomnia.  Because of her work and the pressures she is unable to have a restful sleep.  REVIEW OF SYSTEMS:   Constitutional: Denies fevers, chills or abnormal weight loss Eyes: Denies blurriness of vision Ears, nose, mouth, throat, and face: Denies mucositis or sore throat Respiratory: Denies cough, dyspnea or wheezes Cardiovascular: Denies palpitation, chest discomfort Gastrointestinal:  Denies nausea, heartburn or change in bowel habits Skin: Denies abnormal skin rashes Lymphatics: Denies new lymphadenopathy or easy bruising Neurological:Denies numbness, tingling or new weaknesses Behavioral/Psych: Mood is stable, no new changes  Extremities: No lower extremity edema Breast:  denies any pain or lumps or nodules in either breasts All other systems were reviewed with the patient and are negative.  I have reviewed the past medical history, past surgical history, social history and family history with the patient and they are unchanged from previous note.  ALLERGIES:  is allergic to codeine.  MEDICATIONS:  Current Outpatient Medications  Medication Sig Dispense Refill  . Eszopiclone 3 MG TABS Take 1 tablet (3 mg total) by mouth at bedtime as needed for sleep. Take immediately before bedtime 30 tablet 5  . letrozole (FEMARA) 2.5 MG tablet Take 1 tablet (2.5 mg total) by mouth daily. 90 tablet 3  . Melatonin 5 MG TABS Take 1  tablet (5 mg total) by mouth at bedtime as needed. 90 tablet 3  . omega-3 acid ethyl esters (LOVAZA) 1 g capsule Take by mouth 2 (two) times daily.    Marland Kitchen UNABLE TO FIND Med Name: Tonic Alcemy. One scoop every morning    . UNABLE TO FIND daily. Med Name: protandium herbal supplement    . UNABLE TO FIND daily. Med Name: tumeric     No current facility-administered medications for this visit.    Facility-Administered Medications Ordered in Other Visits  Medication Dose Route Frequency Provider Last Rate Last Dose  . heparin lock flush 100 unit/mL  500 Units Intracatheter Once PRN Nicholas Lose, MD      . sodium chloride flush (NS) 0.9 % injection 10 mL  10 mL Intravenous PRN Nicholas Lose, MD   10 mL at 01/24/17 1116  . sodium chloride flush (NS) 0.9 % injection 10 mL  10 mL Intravenous PRN Nicholas Lose, MD   10 mL at 02/14/17 1236  . sodium chloride flush (NS) 0.9 % injection 10 mL  10 mL Intracatheter PRN Nicholas Lose, MD      . trastuzumab (HERCEPTIN) 300 mg in sodium chloride 0.9 % 250 mL chemo infusion  300 mg Intravenous Once Nicholas Lose, MD        PHYSICAL EXAMINATION: ECOG PERFORMANCE STATUS: 1 - Symptomatic but completely ambulatory  Vitals:   08/22/17 1504  BP: 117/75  Pulse: (!) 57  Resp: 18  Temp: 97.7 F (36.5 C)  SpO2: 100%   Filed Weights   08/22/17 1504  Weight: 119 lb 4.8 oz (54.1 kg)    GENERAL:alert, no distress and comfortable SKIN: skin color, texture, turgor are normal, no rashes or significant lesions EYES: normal, Conjunctiva are pink and non-injected, sclera clear OROPHARYNX:no exudate, no erythema and lips, buccal mucosa, and tongue normal  NECK: supple, thyroid normal size, non-tender, without nodularity LYMPH:  no palpable lymphadenopathy in the cervical, axillary or inguinal LUNGS: clear to auscultation and percussion with normal breathing effort HEART: regular rate & rhythm and no murmurs and no lower extremity edema ABDOMEN:abdomen soft,  non-tender and normal bowel sounds MUSCULOSKELETAL:no cyanosis of digits and no clubbing  NEURO: alert & oriented x 3 with fluent speech, no focal motor/sensory deficits EXTREMITIES: No lower extremity edema  LABORATORY DATA:  I have reviewed the data as listed CMP Latest Ref Rng & Units 08/22/2017 07/11/2017 05/30/2017  Glucose 70 - 140 mg/dL 109 93 150(H)  BUN 7 - 26 mg/dL 16 14.2 11.9  Creatinine 0.60 - 1.10 mg/dL 0.61 0.6 0.6  Sodium 136 - 145 mmol/L 139 140 140  Potassium 3.3 - 4.7 mmol/L 3.9 4.0 3.8  Chloride 98 - 109 mmol/L 104 - -  CO2 22 - 29 mmol/L '28 24 25  '$ Calcium 8.4 - 10.4 mg/dL 8.9 9.0 9.2  Total Protein 6.4 - 8.3 g/dL 6.2(L) 6.5 6.7  Total Bilirubin 0.2 - 1.2 mg/dL <0.2(L) 0.50 0.36  Alkaline Phos 40 - 150 U/L 64 66 61  AST 5 - 34 U/L '11 14 13  '$ ALT 0 - 55 U/L 10 13 10  Lab Results  Component Value Date   WBC 3.2 (L) 08/22/2017   HGB 11.8 08/22/2017   HCT 34.5 (L) 08/22/2017   MCV 93.5 08/22/2017   PLT 208 08/22/2017   NEUTROABS 1.9 08/22/2017    ASSESSMENT & PLAN:  Malignant neoplasm of upper-inner quadrant of left breast in female, estrogen receptor positive ([facility]) Palpable left breast mass for 2 months, ultrasound at 9:00: 4.7 x 3.6 x 3.3 cm, axilla negative: U/S Bx: Grade 2 IDC ER 95%, PR 50%, HER-2 positive ratio 6.17, Ki-67 20%,  Right breast biopsy: PASH  03/05/2017 Left lumpectomy: IDC grade 2, 2.5 cm, margins negative, 0/2 lymph nodes negative, ER 100%, PR 50%, HER-2 positive ratio 4.88, T2 N0 stage IB AJCC 8  Treatment summary 1. Neoadjuvant chemotherapy with TCH Perjeta 6 started 10/11/2016 completed 01/24/2017 followed by Herceptinmaintenance for 1 year 2.Breast conserving surgery on 03/05/2017 3. Followed by adjuvant radiation therapy 05/21/2017-06/09/2017 4. Followed by adjuvant antiestrogen therapy ---------------------------------------------------------------------- Current treatment: Herceptin maintenance which will be completed  10/03/2017 Letrozole Toxicities: Denies any side effects to letrozole.  Denies any hot flashes or myalgias.  Return to clinic every 3 weeks for Herceptin in 6 weeks for follow-up with me Insomnia: I discussed with her about stress and relaxation exercises  Patient tells me that she is planning to go to [place] and immersed herself in their culture in May 2019.  I spent 25 minutes talking to the patient of which more than half was spent in counseling and coordination of care.  No orders of the defined types were placed in this encounter.  The patient has a good understanding of the overall plan. she agrees with it. she will call with any problems that may develop before the next visit here.   Harriette Ohara, MD 08/22/17

## 2017-08-24 ENCOUNTER — Telehealth: Payer: Self-pay | Admitting: Hematology and Oncology

## 2017-08-24 NOTE — Telephone Encounter (Signed)
Spoke to patient regarding upcoming February and March appointments.

## 2017-09-11 ENCOUNTER — Ambulatory Visit: Payer: BLUE CROSS/BLUE SHIELD | Attending: Radiation Oncology | Admitting: Physical Therapy

## 2017-09-11 ENCOUNTER — Encounter: Payer: Self-pay | Admitting: Physical Therapy

## 2017-09-11 DIAGNOSIS — M62838 Other muscle spasm: Secondary | ICD-10-CM | POA: Diagnosis not present

## 2017-09-11 DIAGNOSIS — M6281 Muscle weakness (generalized): Secondary | ICD-10-CM | POA: Insufficient documentation

## 2017-09-11 NOTE — Therapy (Addendum)
[facility] [address], [place], [place], [address] Phone: [phone]   Fax:  [phone]  Physical Therapy Evaluation  Patient Details  Name: LEATA DOMINY MRN: [id number] Date of Birth: 1956-07-29 Referring Provider: Eppie Gibson, MD   Encounter Date: 09/11/2017  PT End of Session - 09/11/17 1223    Visit Number  1    Number of Visits  8    Date for PT Re-Evaluation  11/06/17    PT Start Time  1017    PT Stop Time  1058    PT Time Calculation (min)  41 min    Activity Tolerance  Patient tolerated treatment well    Behavior During Therapy  [facility] for tasks assessed/performed       Past Medical History:  Diagnosis Date  . ADHD (attention deficit hyperactivity disorder)   . Breast cancer ([place])    left 2018  . Cancer ([place]) 12/2016   left breast cancer  . Fibrocystic breast disease   . History of radiation therapy 05/21/17- 06/15/17   Left Breast/ 40.05 Gy in 15 fractions, Left Breast Boost/ 10 Gy in 5 fractions.   . Personal history of chemotherapy     Past Surgical History:  Procedure Laterality Date  . ANKLE FRACTURE SURGERY Left   . APPENDECTOMY    . BREAST BIOPSY    . BREAST LUMPECTOMY Left    2018  . BREAST LUMPECTOMY WITH RADIOACTIVE SEED AND SENTINEL LYMPH NODE BIOPSY Left 03/05/2017   Procedure: LEFT BREAST LUMPECTOMY WITH RADIOACTIVE SEED AND SENTINEL LYMPH NODE BIOPSY AND BLUE DYE INJECTION;  Surgeon: Rolm Bookbinder, MD;  Location: [place];  Service: General;  Laterality: Left;  . BREAST LUMPECTOMY WITH RADIOACTIVE SEED LOCALIZATION Left 04/23/2017   Procedure: EXCISION OF LEFT BREAST SEED LOCALIZED MARGINS (TWO SEEDS);  Surgeon: Rolm Bookbinder, MD;  Location: [place];  Service: General;  Laterality: Left;  . HUMERUS FRACTURE SURGERY Left   . IR GENERIC HISTORICAL  10/10/2016   IR US GUIDE VASC ACCESS RIGHT 10/10/2016 Jacqulynn Cadet, MD [facility]   . IR GENERIC HISTORICAL  10/10/2016   IR FLUORO GUIDE PORT INSERTION RIGHT 10/10/2016 Jacqulynn Cadet, MD [facility]  . ORIF WRIST FRACTURE    . TEMPOROMANDIBULAR JOINT SURGERY  1986    There were no vitals filed for this visit.   Subjective Assessment - 09/11/17 1023    Subjective  Patient reports that after cancer treatment and going through menopause, I had so much tightness I haven't wanted to have intercourse, but am now hoping I can get back to intercourse in the future    Pertinent History  breast cancer    Patient Stated Goals  be able to enjoy intercourse, improve quality of life    Currently in Pain?  No/denies         [facility] PT Assessment - 09/11/17 0001      Assessment   Medical Diagnosis  C50.212,Z17.0 (ICD-10-CM) - Malignant neoplasm of upper-inner quadrant of left breast in female, estrogen receptor positive [facility]    Referring Provider  Eppie Gibson, MD    Onset Date/Surgical Date  -- post cancer    Prior Therapy  No      Precautions   Precautions  None      Restrictions   Weight Bearing Restrictions  No      Balance Screen   Has the patient fallen in the past 6 months  Yes  How many times?  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    Pelvic Floor Special Questions - 09/11/17 0001    Prior Pelvic/Prostate Exam  Yes    Are you Pregnant or attempting pregnancy?  No    Prior Pregnancies  Yes     Number of Pregnancies  2    Currently Sexually Active  Yes    Is this Painful  Yes    Urinary Leakage  No    Urinary urgency  No    Fecal incontinence  No    Fluid intake  a gallon    Caffeine beverages  a cup/day    Skin Integrity  Intact    Perineal Body/Introitus   Elevated    External Palpation  tender to ischial cavernosus and transverse peroneus attachments    Prolapse  None    Pelvic Floor Internal Exam  pt informed and consent given to perform internal soft tissue assessment    Exam Type  Vaginal    Palpation  tight ischiocavernosis, bulbocavernosis, transverse peroneus, LA, coccygeus, OI    Strength  weak squeeze, no lift    Strength # of seconds  10               PT Education - 09/11/17 1059    Education provided  Yes    Education Details  moisturizer, self massage with and without dilators, stretches    Person(s) Educated  Patient    Methods  Explanation;Demonstration    Comprehension  Verbalized understanding;Returned demonstration       PT Short Term Goals - 09/11/17 1231      PT SHORT TERM GOAL #1   Title  ind with initial HEP    Time  4    Period  Weeks    Status  New    Target Date  10/09/17        PT Long Term Goals - 09/11/17 1231      PT LONG TERM GOAL #1   Title  ind with advanced HEP and able to use dilators or self massage techniques to stretch pelvic floor    Time  8    Period  Weeks    Status  New    Target Date  11/06/17      PT LONG TERM GOAL #2   Title  use moiturizers correctly for improved soft tissue integrity    Time  8    Period  Weeks    Status  New    Target Date  10/09/17      PT LONG TERM GOAL #3   Title  Patient will report 1/3 on Marinoff scale    Time  8    Period  Weeks    Status  New    Target Date  11/06/17        Long Term Clinic Goals - 03/22/17 1203      CC Long Term Goal  #1   Title  Pt. will be knowledgeable about lymphedema risk reduction practices.    Time  3    Period  Weeks    Status   New    Target Date  04/27/17      CC Long Term Goal  #2   Title  Pt. will be knowledgeable about safe strengthening exercise program.    Time  2    Period  Weeks    Status  New    Target Date  04/13/17          Plan - 09/11/17 1316    Clinical Impression Statement  Patient is at clinic due to pain with intercourse.  She is 3/3 on Marinoff scale.  Pt demonstrates all AROM WFL.  She has pelvic floor muscle stenosis throughout with tight and tender bulbospongeosis, ischiocavernosis, obdurator internus, levator ani and coccygeus.  Pt has pelvic floor weakness of 2/5 but was able to hold 10 seconds.  Pt will benefit from skilled therapy to work on improved muscle control and flexibility and address all impairments.  Patient will benefit so she can return to functional activities and improve quality of life.    History and Personal Factors relevant to plan of care:  breast cancer    Clinical Presentation  Stable    Clinical Presentation due to:  pt is stable    Clinical Decision Making  Low    Rehab Potential  Excellent    PT Frequency  1x / week    PT Duration  8 weeks    PT Treatment/Interventions  ADLs/Self Care Home Management;Biofeedback;Cryotherapy;Electrical Stimulation;Moist Heat;Therapeutic activities;Therapeutic exercise;Neuromuscular re-education;Patient/family education;Manual techniques;Passive range of motion;Dry needling    PT Next Visit Plan  review dilator protocol, stretches, internal STM    Consulted and Agree with Plan of Care  Patient       Patient will benefit from skilled therapeutic intervention in order to improve the following deficits and impairments:  Impaired flexibility, Impaired tone, Increased muscle spasms, Pain, Decreased strength  Visit Diagnosis: Other muscle spasm - Plan: PT plan of care cert/re-cert  Muscle weakness (generalized) - Plan: PT plan of care cert/re-cert     Problem List Patient Active Problem List   Diagnosis Date Noted  . Rash  10/26/2016  . Port catheter in place 10/18/2016  . Malignant neoplasm of upper-inner quadrant of left breast in female, estrogen receptor positive ([place]) 09/19/2016  . ADHD (attention deficit hyperactivity disorder)   . Fibrocystic breast disease     Zannie Cove, PT 09/11/2017, 1:36 PM  [facility] [address], [place], [place], [address] Phone: [phone]   Fax:  [phone]  Name: YAMIRA PAPA MRN: [id number] Date of Birth: 11/24/1955  PHYSICAL THERAPY DISCHARGE SUMMARY  Visits from Start of Care: 1  Current functional level related to goals / functional outcomes: See above remaining, eval only   Remaining deficits: See above details   Education / Equipment: See handout  Plan: Patient agrees to discharge.  Patient goals were not met. Patient is being discharged due to not returning since the last visit.  ?????    [employer], PT 11/12/17 9:23 AM

## 2017-09-11 NOTE — Patient Instructions (Signed)
STRETCHING THE PELVIC FLOOR MUSCLES NO DILATOR  Supplies . Vaginal lubricant . Mirror (optional) . Gloves (optional) Positioning . Start in a semi-reclined position with your head propped up. Bend your knees and place your thumb or finger at the vaginal opening. Procedure . Apply a moderate amount of lubricant on the outer skin of your vagina, the labia minora.  Apply additional lubricant to your finger. Marland Kitchen Spread the skin away from the vaginal opening. Place the end of your finger at the opening. . Do a maximum contraction of the pelvic floor muscles. Tighten the vagina and the anus maximally and relax. . When you know they are relaxed, gently and slowly insert your finger into your vagina, directing your finger slightly downward, for 2-3 inches of insertion. . Relax and stretch the 6 o'clock position . Hold each stretch for _2 min__ and repeat __1_ time with rest breaks of _1__ seconds between each stretch. . Repeat the stretching in the 4 o'clock and 8 o'clock positions. . Total time should be _6__ minutes, _1__ x per day.  Note the amount of theme your were able to achieve and your tolerance to your finger in your vagina. . Once you have accomplished the techniques you may try them in standing with one foot resting on the tub, or in other positions.  This is a good stretch to do in the shower if you don't need to use lubricant.  PROTOCOL FOR DILATORS  *Search dilators on smartrecharges.com or amazon 1. Wash dilator with soap and water prior to insertion.    2. Lay on your back reclined. Knees are to be up and apart while on your bed or in the bathtub with warm water.   3. Lubricate the end of the dilator with a water-soluble lubricant.  4. Separate the labia.   5. Tense the pelvic floor muscles than relax; while relaxing, slide lubricated dilator into the vagina.    6. Tense muscles again while holding the dilator so it does not get pushed out; relax and slide it in a little further.    7. Try blowing out as if filling a balloon; this may relax the muscles and allow penetration.  Repeat blowing out to insert dilator further.  8. Keep dilator in for 10 minutes if tolerate, with the pelvic floor muscles relaxed to further stretch the canal.   9. Never force the dilator into the canal.  10. 1-2 times per day  Moisturizers . They are used in the vagina to hydrate the mucous membrane that make up the vaginal canal. . Designed to keep a more normal acid balance (ph) . Once placed in the vagina, it will last between two to three days.  . Use 2-3 times per week at bedtime and last longer than 60 min. . Ingredients to avoid is glycerin and fragrance, can increase chance of infection . Should not be used just before sex due to causing irritation . Most are gels administered either in a tampon-shaped applicator or as a vaginal suppository. They are non-hormonal.   Types of Moisturizers . Samul Dada- drug store . Vitamin E vaginal suppositories- Whole foods, Amazon . Moist Again . Coconut oil- can break down condoms . Michail Jewels . Yes moisturizer- amazon . NeuEve Silk , NeuEve Silver for menopausal or over 65 (if have severe vaginal atrophy or cancer treatments use NeuEve Silk for  1 month than move to [employer]eps[employer])- [employer], MapleFlower.dk . Olive and Bee intimate cream- www.oliveandbee.com.au  Creams to use externally  on the Vulva area  Albertson's (good for for cancer patients that had radiation to the area)- [place] or [employer].FlyingBasics.com.br  V-magic cream - amazon  Julva-amazon  Vital "V Wild Yam salve ( help moisturize and help with thinning vulvar area, does have [place]   Things to avoid in the vaginal area . Do not use things to irritate the vulvar area . No lotions just specialized creams for the vulva area- Neogyn, V-magic, No soaps; can use Aveeno or Calendula cleanser if  needed. Must be gentle . No deodorants . No douches . Good to sleep without underwear to let the vaginal area to air out . No scrubbing: spread the lips to let warm water rinse over labias and pat dry     Cat Cow  Position yourself on your hands and knees with your hands placed under your shoulders and your knees directly under your hips. Slowly round your back up towards the ceiling and then arch your back down by pulling your abdomen towards the floor.     Happy Baby  1. Position yourself as shown, grabbing onto the feet or behind the knees; you should feel a gentle stretch.  2. Breathe in and allow the pelvic floor muscles to relax.  3. Hold this position for 2-3 minutes.

## 2017-09-12 ENCOUNTER — Inpatient Hospital Stay: Payer: BLUE CROSS/BLUE SHIELD

## 2017-09-12 ENCOUNTER — Inpatient Hospital Stay: Payer: BLUE CROSS/BLUE SHIELD | Attending: Hematology and Oncology

## 2017-09-12 VITALS — BP 134/69 | HR 72 | Temp 98.2°F | Resp 16

## 2017-09-12 DIAGNOSIS — C50212 Malignant neoplasm of upper-inner quadrant of left female breast: Secondary | ICD-10-CM | POA: Diagnosis present

## 2017-09-12 DIAGNOSIS — Z170 Estrogen receptor positive status [ER+]: Secondary | ICD-10-CM

## 2017-09-12 DIAGNOSIS — Z95828 Presence of other vascular implants and grafts: Secondary | ICD-10-CM

## 2017-09-12 DIAGNOSIS — Z5112 Encounter for antineoplastic immunotherapy: Secondary | ICD-10-CM | POA: Insufficient documentation

## 2017-09-12 LAB — COMPREHENSIVE METABOLIC PANEL
ALBUMIN: 3.9 g/dL (ref 3.5–5.0)
ALT: 9 U/L (ref 0–55)
AST: 14 U/L (ref 5–34)
Alkaline Phosphatase: 69 U/L (ref 40–150)
Anion gap: 9 (ref 3–11)
BUN: 12 mg/dL (ref 7–26)
CHLORIDE: 105 mmol/L (ref 98–109)
CO2: 27 mmol/L (ref 22–29)
Calcium: 9.2 mg/dL (ref 8.4–10.4)
Creatinine, Ser: 0.66 mg/dL (ref 0.60–1.10)
GFR calc Af Amer: >60 mL/min (ref >60)
GFR calc non Af Amer: >60 mL/min (ref >60)
GLUCOSE: 133 mg/dL (ref 70–140)
POTASSIUM: 3.8 mmol/L (ref 3.5–5.1)
SODIUM: 141 mmol/L (ref 136–145)
Total Bilirubin: 0.4 mg/dL (ref 0.2–1.2)
Total Protein: 6.7 g/dL (ref 6.4–8.3)

## 2017-09-12 LAB — CBC WITH DIFFERENTIAL/PLATELET
BASOS ABS: 0 10*3/uL (ref 0.0–0.1)
BASOS PCT: 1 %
EOS ABS: 0 10*3/uL (ref 0.0–0.5)
Eosinophils Relative: 1 %
HCT: 36.7 % (ref 34.8–46.6)
Hemoglobin: 12.6 g/dL (ref 11.6–15.9)
Lymphocytes Relative: 30 %
Lymphs Abs: 1 10*3/uL (ref 0.9–3.3)
MCH: 31.9 pg (ref 25.1–34.0)
MCHC: 34.2 g/dL (ref 31.5–36.0)
MCV: 93.4 fL (ref 79.5–101.0)
MONOS PCT: 7 %
Monocytes Absolute: 0.2 10*3/uL (ref 0.1–0.9)
NEUTROS ABS: 2 10*3/uL (ref 1.5–6.5)
Neutrophils Relative %: 61 %
Platelets: 208 10*3/uL (ref 145–400)
RBC: 3.94 MIL/uL (ref 3.70–5.45)
RDW: 12.3 % (ref 11.2–14.5)
WBC: 3.3 10*3/uL — ABNORMAL LOW (ref 3.9–10.3)

## 2017-09-12 MED ORDER — SODIUM CHLORIDE 0.9% FLUSH
10.0000 mL | INTRAVENOUS | Status: DC | PRN
  Administered 2017-09-12: 10 mL
  Filled 2017-09-12: qty 10

## 2017-09-12 MED ORDER — DIPHENHYDRAMINE HCL 25 MG PO CAPS
ORAL_CAPSULE | ORAL | Status: AC
  Filled 2017-09-12: qty 1

## 2017-09-12 MED ORDER — ACETAMINOPHEN 325 MG PO TABS
ORAL_TABLET | ORAL | Status: AC
  Filled 2017-09-12: qty 2

## 2017-09-12 MED ORDER — ACETAMINOPHEN 325 MG PO TABS
650.0000 mg | ORAL_TABLET | Freq: Once | ORAL | Status: AC
  Administered 2017-09-12: 650 mg via ORAL

## 2017-09-12 MED ORDER — DIPHENHYDRAMINE HCL 25 MG PO CAPS
50.0000 mg | ORAL_CAPSULE | Freq: Once | ORAL | Status: AC
  Administered 2017-09-12: 25 mg via ORAL

## 2017-09-12 MED ORDER — HEPARIN SOD (PORK) LOCK FLUSH 100 UNIT/ML IV SOLN
500.0000 [IU] | Freq: Once | INTRAVENOUS | Status: AC | PRN
  Administered 2017-09-12: 500 [IU]
  Filled 2017-09-12: qty 5

## 2017-09-12 MED ORDER — SODIUM CHLORIDE 0.9% FLUSH
10.0000 mL | INTRAVENOUS | Status: DC | PRN
  Administered 2017-09-12: 10 mL via INTRAVENOUS
  Filled 2017-09-12: qty 10

## 2017-09-12 MED ORDER — TRASTUZUMAB CHEMO 150 MG IV SOLR
300.0000 mg | Freq: Once | INTRAVENOUS | Status: AC
  Administered 2017-09-12: 300 mg via INTRAVENOUS
  Filled 2017-09-12: qty 14.29

## 2017-09-12 MED ORDER — SODIUM CHLORIDE 0.9 % IV SOLN
Freq: Once | INTRAVENOUS | Status: AC
Start: 2017-09-12 — End: 2017-09-12
  Administered 2017-09-12: 15:00:00 via INTRAVENOUS

## 2017-09-12 NOTE — Patient Instructions (Signed)
[facility] Discharge Instructions for Patients Receiving Chemotherapy  Today you received the following chemotherapy agents herceptin   To help prevent nausea and vomiting after your treatment, we encourage you to take your nausea medication as directed   If you develop nausea and vomiting that is not controlled by your nausea medication, call the clinic.   BELOW ARE SYMPTOMS THAT SHOULD BE REPORTED IMMEDIATELY:  *FEVER GREATER THAN 100.5 F  *CHILLS WITH OR WITHOUT FEVER  NAUSEA AND VOMITING THAT IS NOT CONTROLLED WITH YOUR NAUSEA MEDICATION  *UNUSUAL SHORTNESS OF BREATH  *UNUSUAL BRUISING OR BLEEDING  TENDERNESS IN MOUTH AND THROAT WITH OR WITHOUT PRESENCE OF ULCERS  *URINARY PROBLEMS  *BOWEL PROBLEMS  UNUSUAL RASH Items with * indicate a potential emergency and should be followed up as soon as possible.  Feel free to call the clinic you have any questions or concerns. The clinic phone number is [phone].  

## 2017-09-12 NOTE — Patient Instructions (Signed)
Implanted Port Home Guide An implanted port is a type of central line that is placed under the skin. Central lines are used to provide IV access when treatment or nutrition needs to be given through a person's veins. Implanted ports are used for long-term IV access. An implanted port may be placed because:  You need IV medicine that would be irritating to the small veins in your hands or arms.  You need long-term IV medicines, such as antibiotics.  You need IV nutrition for a long period.  You need frequent blood draws for lab tests.  You need dialysis.  Implanted ports are usually placed in the chest area, but they can also be placed in the upper arm, the abdomen, or the leg. An implanted port has two main parts:  Reservoir. The reservoir is round and will appear as a small, raised area under your skin. The reservoir is the part where a needle is inserted to give medicines or draw blood.  Catheter. The catheter is a thin, flexible tube that extends from the reservoir. The catheter is placed into a large vein. Medicine that is inserted into the reservoir goes into the catheter and then into the vein.  How will I care for my incision site? Do not get the incision site wet. Bathe or shower as directed by your health care provider. How is my port accessed? Special steps must be taken to access the port:  Before the port is accessed, a numbing cream can be placed on the skin. This helps numb the skin over the port site.  Your health care provider uses a sterile technique to access the port. ? Your health care provider must put on a mask and sterile gloves. ? The skin over your port is cleaned carefully with an antiseptic and allowed to dry. ? The port is gently pinched between sterile gloves, and a needle is inserted into the port.  Only "non-coring" port needles should be used to access the port. Once the port is accessed, a blood return should be checked. This helps ensure that the port  is in the vein and is not clogged.  If your port needs to remain accessed for a constant infusion, a clear (transparent) bandage will be placed over the needle site. The bandage and needle will need to be changed every week, or as directed by your health care provider.  Keep the bandage covering the needle clean and dry. Do not get it wet. Follow your health care provider's instructions on how to take a shower or bath while the port is accessed.  If your port does not need to stay accessed, no bandage is needed over the port.  What is flushing? Flushing helps keep the port from getting clogged. Follow your health care provider's instructions on how and when to flush the port. Ports are usually flushed with saline solution or a medicine called heparin. The need for flushing will depend on how the port is used.  If the port is used for intermittent medicines or blood draws, the port will need to be flushed: ? After medicines have been given. ? After blood has been drawn. ? As part of routine maintenance.  If a constant infusion is running, the port may not need to be flushed.  How long will my port stay implanted? The port can stay in for as long as your health care provider thinks it is needed. When it is time for the port to come out, surgery will be   done to remove it. The procedure is similar to the one performed when the port was put in. When should I seek immediate medical care? When you have an implanted port, you should seek immediate medical care if:  You notice a bad smell coming from the incision site.  You have swelling, redness, or drainage at the incision site.  You have more swelling or pain at the port site or the surrounding area.  You have a fever that is not controlled with medicine.  This information is not intended to replace advice given to you by your health care provider. Make sure you discuss any questions you have with your health care provider. Document  Released: 07/17/2005 Document Revised: 12/23/2015 Document Reviewed: 03/24/2013 Elsevier Interactive Patient Education  2017 [employer]  

## 2017-09-18 ENCOUNTER — Ambulatory Visit
Admission: RE | Admit: 2017-09-18 | Discharge: 2017-09-18 | Disposition: A | Discharge status: Home / Self Care | Payer: BLUE CROSS/BLUE SHIELD | Source: Ambulatory Visit | Attending: Hematology and Oncology | Admitting: Hematology and Oncology

## 2017-09-18 ENCOUNTER — Other Ambulatory Visit: Payer: Self-pay | Admitting: Hematology and Oncology

## 2017-09-18 DIAGNOSIS — C50212 Malignant neoplasm of upper-inner quadrant of left female breast: Secondary | ICD-10-CM

## 2017-09-18 DIAGNOSIS — Z170 Estrogen receptor positive status [ER+]: Principal | ICD-10-CM

## 2017-09-18 HISTORY — DX: Personal history of irradiation: Z92.3

## 2017-09-26 ENCOUNTER — Ambulatory Visit: Payer: BLUE CROSS/BLUE SHIELD | Admitting: Physical Therapy

## 2017-09-27 ENCOUNTER — Other Ambulatory Visit: Payer: Self-pay | Admitting: Hematology and Oncology

## 2017-09-27 ENCOUNTER — Ambulatory Visit
Admission: RE | Admit: 2017-09-27 | Discharge: 2017-09-27 | Disposition: A | Discharge status: Home / Self Care | Payer: BLUE CROSS/BLUE SHIELD | Source: Ambulatory Visit | Attending: Hematology and Oncology | Admitting: Hematology and Oncology

## 2017-09-27 DIAGNOSIS — Z170 Estrogen receptor positive status [ER+]: Principal | ICD-10-CM

## 2017-09-27 DIAGNOSIS — C50212 Malignant neoplasm of upper-inner quadrant of left female breast: Secondary | ICD-10-CM

## 2017-09-27 HISTORY — PX: BREAST BIOPSY: SHX20

## 2017-10-03 ENCOUNTER — Inpatient Hospital Stay (HOSPITAL_BASED_OUTPATIENT_CLINIC_OR_DEPARTMENT_OTHER): Payer: BLUE CROSS/BLUE SHIELD | Admitting: Hematology and Oncology

## 2017-10-03 ENCOUNTER — Encounter: Payer: Self-pay | Admitting: *Deleted

## 2017-10-03 ENCOUNTER — Inpatient Hospital Stay: Payer: BLUE CROSS/BLUE SHIELD

## 2017-10-03 ENCOUNTER — Telehealth: Payer: Self-pay | Admitting: Pharmacist

## 2017-10-03 ENCOUNTER — Inpatient Hospital Stay: Payer: BLUE CROSS/BLUE SHIELD | Attending: Hematology and Oncology

## 2017-10-03 DIAGNOSIS — Z170 Estrogen receptor positive status [ER+]: Secondary | ICD-10-CM | POA: Insufficient documentation

## 2017-10-03 DIAGNOSIS — C50212 Malignant neoplasm of upper-inner quadrant of left female breast: Secondary | ICD-10-CM

## 2017-10-03 DIAGNOSIS — Z95828 Presence of other vascular implants and grafts: Secondary | ICD-10-CM

## 2017-10-03 DIAGNOSIS — Z79811 Long term (current) use of aromatase inhibitors: Secondary | ICD-10-CM | POA: Insufficient documentation

## 2017-10-03 DIAGNOSIS — Z5112 Encounter for antineoplastic immunotherapy: Secondary | ICD-10-CM | POA: Insufficient documentation

## 2017-10-03 LAB — COMPREHENSIVE METABOLIC PANEL
ALT: 13 U/L (ref 0–55)
ANION GAP: 8 (ref 3–11)
AST: 14 U/L (ref 5–34)
Albumin: 3.7 g/dL (ref 3.5–5.0)
Alkaline Phosphatase: 62 U/L (ref 40–150)
BUN: 15 mg/dL (ref 7–26)
CHLORIDE: 106 mmol/L (ref 98–109)
CO2: 26 mmol/L (ref 22–29)
Calcium: 9.6 mg/dL (ref 8.4–10.4)
Creatinine, Ser: 0.63 mg/dL (ref 0.60–1.10)
GFR calc non Af Amer: >60 mL/min (ref >60)
Glucose, Bld: 125 mg/dL (ref 70–140)
Potassium: 4.1 mmol/L (ref 3.5–5.1)
SODIUM: 140 mmol/L (ref 136–145)
Total Bilirubin: 0.6 mg/dL (ref 0.2–1.2)
Total Protein: 6.6 g/dL (ref 6.4–8.3)

## 2017-10-03 LAB — CBC WITH DIFFERENTIAL/PLATELET
Basophils Absolute: 0 10*3/uL (ref 0.0–0.1)
Basophils Relative: 0 %
EOS ABS: 0.1 10*3/uL (ref 0.0–0.5)
EOS PCT: 1 %
HCT: 36.1 % (ref 34.8–46.6)
Hemoglobin: 12.2 g/dL (ref 11.6–15.9)
LYMPHS ABS: 0.9 10*3/uL (ref 0.9–3.3)
Lymphocytes Relative: 23 %
MCH: 31.9 pg (ref 25.1–34.0)
MCHC: 33.8 g/dL (ref 31.5–36.0)
MCV: 94.5 fL (ref 79.5–101.0)
MONOS PCT: 6 %
Monocytes Absolute: 0.2 10*3/uL (ref 0.1–0.9)
Neutro Abs: 2.9 10*3/uL (ref 1.5–6.5)
Neutrophils Relative %: 70 %
PLATELETS: 185 10*3/uL (ref 145–400)
RBC: 3.82 MIL/uL (ref 3.70–5.45)
RDW: 12.2 % (ref 11.2–14.5)
WBC: 4.1 10*3/uL (ref 3.9–10.3)

## 2017-10-03 MED ORDER — SODIUM CHLORIDE 0.9% FLUSH
10.0000 mL | INTRAVENOUS | Status: DC | PRN
  Administered 2017-10-03: 10 mL via INTRAVENOUS
  Filled 2017-10-03: qty 10

## 2017-10-03 MED ORDER — SODIUM CHLORIDE 0.9 % IV SOLN
Freq: Once | INTRAVENOUS | Status: AC
  Administered 2017-10-03: 09:00:00 via INTRAVENOUS

## 2017-10-03 MED ORDER — ACETAMINOPHEN 325 MG PO TABS
ORAL_TABLET | ORAL | Status: AC
Start: 2017-10-03 — End: 2017-10-03
  Filled 2017-10-03: qty 2

## 2017-10-03 MED ORDER — HEPARIN SOD (PORK) LOCK FLUSH 100 UNIT/ML IV SOLN
500.0000 [IU] | Freq: Once | INTRAVENOUS | Status: AC | PRN
  Administered 2017-10-03: 500 [IU]
  Filled 2017-10-03: qty 5

## 2017-10-03 MED ORDER — DIPHENHYDRAMINE HCL 25 MG PO CAPS
ORAL_CAPSULE | ORAL | Status: AC
  Filled 2017-10-03: qty 2

## 2017-10-03 MED ORDER — NERATINIB MALEATE 40 MG PO TABS: 240.0000 mg | ORAL_TABLET | Freq: Every day | ORAL | 0 refills | Status: DC

## 2017-10-03 MED ORDER — ACETAMINOPHEN 325 MG PO TABS
650.0000 mg | ORAL_TABLET | Freq: Once | ORAL | Status: AC
  Administered 2017-10-03: 650 mg via ORAL

## 2017-10-03 MED ORDER — SODIUM CHLORIDE 0.9% FLUSH
10.0000 mL | INTRAVENOUS | Status: DC | PRN
  Administered 2017-10-03: 10 mL
  Filled 2017-10-03: qty 10

## 2017-10-03 MED ORDER — TRASTUZUMAB CHEMO 150 MG IV SOLR
300.0000 mg | Freq: Once | INTRAVENOUS | Status: AC
  Administered 2017-10-03: 300 mg via INTRAVENOUS
  Filled 2017-10-03: qty 14.29

## 2017-10-03 MED ORDER — DIPHENHYDRAMINE HCL 25 MG PO CAPS
50.0000 mg | ORAL_CAPSULE | Freq: Once | ORAL | Status: AC
  Administered 2017-10-03: 50 mg via ORAL

## 2017-10-03 NOTE — Assessment & Plan Note (Signed)
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 Neoadjuvant chemotherapy with TCH Perjeta 6 started 10/11/2016 completed 01/24/2017 followed by Herceptinmaintenance for 1 year 2.Breast conserving surgery on 03/05/2017 3. Followed by adjuvant radiation therapy 05/21/2017-06/09/2017 4. Followed by adjuvant antiestrogen therapy ---------------------------------------------------------------------- Current treatment: Herceptin maintenance, today's last dose of Herceptin. Letrozole Toxicities: Denies any major side effects to letrozole.  Does not have any hot flashes or myalgias.  Left breast biopsy 09/27/2017: Fibrocystic changes no evidence of malignancy  I will request the port to be removed. Patient tells me that she is planning to go to [place] and immersed herself in their culture in May 2019.

## 2017-10-03 NOTE — Telephone Encounter (Signed)
Oral Chemotherapy Pharmacist Encounter   I spoke with patient in infusion room for overview of: Nerlynx.  Counseled patient on administration, dosing, side effects, monitoring, drug-food interactions, safe handling, storage, and disposal.  Patient will take Nerlynx 40mg  tablets, 6 tablets (240mg ) by mouth once daily with food.  Patient knows to avoid grapefruit and grapefruit juice while on treatment with Nerlynx.  Patient instructed to avoid use of PPIs or H2RAs while on treatment with Nerlynx, can separate antacids from Nerlynx by 3 hours if acid suppression is needed.  Nerlynx start date: 10/24/2017  Side effects include but not limited to: diarrhea, nausea, vomiting, mouth sores, fatigue, rash, and abdominal pain.    Patient instructed on antidiarrheal prophylaxis with loperamide:  Loperamide 4mg  by mouth TID for weeks 1-2  Loperamide 4mg  by mouth BID for weeks 3-8  Loperamide 4mg  by mouth as needed for diarrhea for remaining length of therapy  Patient instructed to increase or decrease loperamide dose to maintain 1-2 bowel movements per day  Reviewed with patient importance of keeping a medication schedule and plan for any missed doses.  Ms. Hebert voiced understanding and appreciation.   All questions answered. Medication reconciliation performed and medication/allergy list updated. Reviewed herbal supplements patient currently taking, all appear safe to take concurrently with Nerlynx.  Will follow up with patient regarding insurance and pharmacy. Patient will call the office when she starts Nerlynx but does plan to start on 10/24/2017. Prescription originally sent to [facility], however they do not have access to dispense the medication.  Prescription has been e- scribed to [facility] (phone: [phone]). Prior authorization has been submitted, and is pending.  Patient knows to call the office with questions or concerns.  Oral Oncology Clinic will continue to follow.  Thank you,  Johny Drilling, PharmD, BCPS, BCOP 10/03/2017   11:47 AM Oral Oncology Clinic [phone]

## 2017-10-03 NOTE — Patient Instructions (Signed)

## 2017-10-03 NOTE — Telephone Encounter (Signed)
Oral Oncology Pharmacist Encounter  Received new prescription for Nerlynx (neratinib) for the continued adjuvant treatment of hormone receptor positive breast cancer in conjunction with Femara, planned duration 1 year of therapy with Nerlynx. Last Herceptin infusion 10/03/2017  Labs from 10/03/2017 assessed, okay for treatment.  Current medication list in Epic reviewed, no DDIs with Nerlynx identified.  Prescription has been e-scribed to the [facility] for benefits analysis and approval.  Oral Oncology Clinic will continue to follow for insurance authorization, copayment issues, initial counseling and start date.  Johny Drilling, PharmD, BCPS, BCOP 10/03/2017 9:11 AM Oral Oncology Clinic [phone]

## 2017-10-03 NOTE — Progress Notes (Signed)
Patient Care Team: Lin Landsman, MD as PCP - General (Family Medicine) [name], MD as Consulting Physician (General Surgery) Nicholas Lose, MD as Consulting Physician (Hematology and Oncology) Eppie Gibson, MD as Attending Physician (Radiation Oncology) Gardenia Phlegm, NP as Nurse Practitioner (Hematology and Oncology)  DIAGNOSIS:  Encounter Diagnosis  Name Primary?  . Malignant neoplasm of upper-inner quadrant of left breast in female, estrogen receptor positive ([place])     SUMMARY OF ONCOLOGIC HISTORY:   Malignant neoplasm of upper-inner quadrant of left breast in female, estrogen receptor positive ([place])   09/14/2016 Initial Diagnosis    Palpable left breast mass for 2 months, ultrasound at 9:00: 4.7 x 3.6 x 3.3 cm, axilla negative: U/S Bx: Grade 2 IDC ER 95%, PR 50%, HER-2 positive ratio 6.17, Ki-67 20%, T2 N0 stage IIA clinical stage      09/27/2016 Breast MRI    Left breast: 3.5 cm irregular necrotic mass; left axillary lymph node 1.2 cm; right breast 0.6 cm mass at 6:00 position 0.5 cm mass right breast lower inner quadrant       10/11/2016 - 01/24/2017 Neo-Adjuvant Chemotherapy    Taxotere, Carboplatin, Herceptin, Perjeta x 6 cycles followed by Herceptin and Perjeta maintenance for one year      01/26/2017 Breast MRI    Left breast: Irregular enhancing mass measuring 2.7 cm, smaller than previous, no abnormal lymph nodes; right breast: No mass or enhancement       03/05/2017 Surgery    Left lumpectomy: IDC grade 2, 2.5 cm, margins negative, 0/2 lymph nodes negative, ER 100%, PR 50%, HER-2 positive ratio 4.88, T2 N0 stage IB AJCC 8       04/17/2017 Mammogram    2 groups of suspicious residual calcifications in the left breast spanning 4.8 cm one is anterior to lumpectomy the other is medial aspect of lumpectomy      05/21/2017 - 06/15/2017 Radiation Therapy    Adjuvant radiation therapy      07/11/2017 -  Anti-estrogen oral therapy    Letrozole 2.5  mg daily       CHIEF COMPLIANT: Last Herceptin dose  INTERVAL HISTORY: Miranda Woods is a 62 year old with above-mentioned history of left breast cancer treated with lumpectomy followed by radiation and is currently on letrozole therapy.  She is completing adjuvant Herceptin and today's last dose of her treatment.  She had a recent left breast biopsy which was benign.  She is very relieved to hear this information. She is tolerating letrozole extremely well.  REVIEW OF SYSTEMS:   Constitutional: Denies fevers, chills or abnormal weight loss Eyes: Denies blurriness of vision Ears, nose, mouth, throat, and face: Denies mucositis or sore throat Respiratory: Denies cough, dyspnea or wheezes Cardiovascular: Denies palpitation, chest discomfort Gastrointestinal:  Denies nausea, heartburn or change in bowel habits Skin: Denies abnormal skin rashes Lymphatics: Denies new lymphadenopathy or easy bruising Neurological:Denies numbness, tingling or new weaknesses Behavioral/Psych: Mood is stable, no new changes  Extremities: No lower extremity edema Breast:  denies any pain or lumps or nodules in either breasts All other systems were reviewed with the patient and are negative.  I have reviewed the past medical history, past surgical history, social history and family history with the patient and they are unchanged from previous note.  ALLERGIES:  is allergic to codeine.  MEDICATIONS:  Current Outpatient Medications  Medication Sig Dispense Refill  . Eszopiclone 3 MG TABS Take 1 tablet (3 mg total) by mouth at bedtime as needed for  sleep. Take immediately before bedtime 30 tablet 5  . letrozole (FEMARA) 2.5 MG tablet Take 1 tablet (2.5 mg total) by mouth daily. 90 tablet 3  . Melatonin 5 MG TABS Take 1 tablet (5 mg total) by mouth at bedtime as needed. 90 tablet 3  . Neratinib Maleate (NERLYNX) 40 MG tablet Take 6 tablets (240 mg total) by mouth daily. Take with food. 180 tablet 0  .  omega-3 acid ethyl esters (LOVAZA) 1 g capsule Take by mouth 2 (two) times daily.    Marland Kitchen UNABLE TO FIND Med Name: Tonic Alcemy. One scoop every morning    . UNABLE TO FIND daily. Med Name: protandium herbal supplement    . UNABLE TO FIND daily. Med Name: tumeric     No current facility-administered medications for this visit.    Facility-Administered Medications Ordered in Other Visits  Medication Dose Route Frequency Provider Last Rate Last Dose  . 0.9 %  sodium chloride infusion   Intravenous Once Nicholas Lose, MD      . acetaminophen (TYLENOL) tablet 650 mg  650 mg Oral Once Nicholas Lose, MD      . diphenhydrAMINE (BENADRYL) capsule 50 mg  50 mg Oral Once Nicholas Lose, MD      . heparin lock flush 100 unit/mL  500 Units Intracatheter Once PRN Nicholas Lose, MD      . sodium chloride flush (NS) 0.9 % injection 10 mL  10 mL Intravenous PRN Nicholas Lose, MD   10 mL at 01/24/17 1116  . sodium chloride flush (NS) 0.9 % injection 10 mL  10 mL Intravenous PRN Nicholas Lose, MD   10 mL at 02/14/17 1236  . sodium chloride flush (NS) 0.9 % injection 10 mL  10 mL Intracatheter PRN Nicholas Lose, MD      . trastuzumab (HERCEPTIN) 300 mg in sodium chloride 0.9 % 250 mL chemo infusion  300 mg Intravenous Once Nicholas Lose, MD        PHYSICAL EXAMINATION: ECOG PERFORMANCE STATUS: 1 - Symptomatic but completely ambulatory  Vitals:   10/03/17 0826  BP: 136/77  Pulse: 67  Resp: 17  Temp: 97.8 F (36.6 C)  SpO2: 100%   Filed Weights   10/03/17 0826  Weight: 120 lb 11.2 oz (54.7 kg)    GENERAL:alert, no distress and comfortable SKIN: skin color, texture, turgor are normal, no rashes or significant lesions EYES: normal, Conjunctiva are pink and non-injected, sclera clear OROPHARYNX:no exudate, no erythema and lips, buccal mucosa, and tongue normal  NECK: supple, thyroid normal size, non-tender, without nodularity LYMPH:  no palpable lymphadenopathy in the cervical, axillary or  inguinal LUNGS: clear to auscultation and percussion with normal breathing effort HEART: regular rate & rhythm and no murmurs and no lower extremity edema ABDOMEN:abdomen soft, non-tender and normal bowel sounds MUSCULOSKELETAL:no cyanosis of digits and no clubbing  NEURO: alert & oriented x 3 with fluent speech, no focal motor/sensory deficits EXTREMITIES: No lower extremity edema  LABORATORY DATA:  I have reviewed the data as listed CMP Latest Ref Rng & Units 10/03/2017 09/12/2017 08/22/2017  Glucose 70 - 140 mg/dL 125 133 109  BUN 7 - 26 mg/dL _0 Creatinine 0.60 - 1.10 mg/dL 0.63 0.66 0.61  Sodium 136 - 145 mmol/L 140 141 139  Potassium 3.5 - 5.1 mmol/L 4.1 3.8 3.9  Chloride 98 - 109 mmol/L 106 105 104  CO2 22 - 29 mmol/L _1 Calcium 8.4 - 10.4 mg/dL 9.6 9.2 8.9  Total Protein 6.4 - 8.3 g/dL 6.6 6.7 6.2(L)  Total Bilirubin 0.2 - 1.2 mg/dL 0.6 0.4 <0.2(L)  Alkaline Phos 40 - 150 U/L 62 69 64  AST 5 - 34 U/L _0 ALT 0 - 55 U/L _1 Lab Results  Component Value Date   WBC 4.1 10/03/2017   HGB 12.2 10/03/2017   HCT 36.1 10/03/2017   MCV 94.5 10/03/2017   PLT 185 10/03/2017   NEUTROABS 2.9 10/03/2017    ASSESSMENT & PLAN:  Malignant neoplasm of upper-inner quadrant of left breast in female, estrogen receptor positive ([facility]) Palpable left breast mass for 2 months, ultrasound at 9:00: 4.7 x 3.6 x 3.3 cm, axilla negative: U/S Bx: Grade 2 IDC ER 95%, PR 50%, HER-2 positive ratio 6.17, Ki-67 20%,  Right breast biopsy: PASH  03/05/2017 Left lumpectomy: IDC grade 2, 2.5 cm, margins negative, 0/2 lymph nodes negative, ER 100%, PR 50%, HER-2 positive ratio 4.88, T2 N0 stage IB AJCC 8  Treatment summary 1. Neoadjuvant chemotherapy with TCH Perjeta 6 started 10/11/2016 completed 01/24/2017 followed by Herceptinmaintenance for 1 year 2.Breast conserving surgery on 03/05/2017 3. Followed by adjuvant radiation therapy 05/21/2017-06/09/2017 4. Followed by  adjuvant antiestrogen therapy ---------------------------------------------------------------------- Current treatment: Herceptin maintenance, today's last dose of Herceptin. Letrozole Toxicities: Denies any major side effects to letrozole.  Does not have any hot flashes or myalgias.  Left breast biopsy 09/27/2017: Fibrocystic changes no evidence of malignancy I discussed the role of Neratinib and patient is very interested in it. Neratinib counseling: Neratinib is up and HER-2 inhibitor that is currently approved for treatment of HER-2 positive breast cancer after conclusion of one year of Herceptin treatment. Based on the studies it does appear to have a progression free survival difference of 2% absolute value. The starting dose of the spill is 200 mg once daily. The greatest side effect of this medication is diarrhea. Patient will need to be on prophylactic antidiarrheal medications. Days 1 to 14: Loperamide 4 mg orally 3 times daily Days 15 to 56: Loperamide 4 mg orally twice daily Days 57 to 365: Loperamide 4 mg as needed (maximum: 16 mg/day) During the treatment will have to monitor the liver functions. Apart from the diarrhea other side effects include fatigue 27%, skin rash 18%, muscle spasms in 11%.  I will request the port to be removed. Patient tells me that she is planning to go to [place] and immersed herself in their culture in May 2019.   I spent 25 minutes talking to the patient of which more than half was spent in counseling and coordination of care.  Orders Placed This Encounter  Procedures  . CBC with Differential (Cancer Center Only)    Standing Status:   Future    Standing Expiration Date:   10/04/2018  . CMP ([place] only)    Standing Status:   Future    Standing Expiration Date:   10/04/2018   The patient has a good understanding of the overall plan. she agrees with it. she will call with any problems that may develop before the next visit here.   Harriette Ohara, MD 10/03/17

## 2017-10-03 NOTE — Patient Instructions (Signed)
[facility] Discharge Instructions for Patients Receiving Chemotherapy  Today you received the following chemotherapy agents Herceptin  To help prevent nausea and vomiting after your treatment, we encourage you to take your nausea medication as directed   If you develop nausea and vomiting that is not controlled by your nausea medication, call the clinic.   BELOW ARE SYMPTOMS THAT SHOULD BE REPORTED IMMEDIATELY:  *FEVER GREATER THAN 100.5 F  *CHILLS WITH OR WITHOUT FEVER  NAUSEA AND VOMITING THAT IS NOT CONTROLLED WITH YOUR NAUSEA MEDICATION  *UNUSUAL SHORTNESS OF BREATH  *UNUSUAL BRUISING OR BLEEDING  TENDERNESS IN MOUTH AND THROAT WITH OR WITHOUT PRESENCE OF ULCERS  *URINARY PROBLEMS  *BOWEL PROBLEMS  UNUSUAL RASH Items with * indicate a potential emergency and should be followed up as soon as possible.  Feel free to call the clinic should you have any questions or concerns. The clinic phone number is [phone].  Please show the CHEMO ALERT CARD at check-in to the Emergency Department and triage nurse.   

## 2017-10-04 ENCOUNTER — Telehealth: Payer: Self-pay | Admitting: Hematology and Oncology

## 2017-10-04 NOTE — Telephone Encounter (Signed)
Mailed patient calendar of upcoming April appointments.  °

## 2017-10-05 ENCOUNTER — Telehealth: Payer: Self-pay | Admitting: Pharmacist

## 2017-10-05 NOTE — Telephone Encounter (Signed)
Oral Oncology Pharmacist Encounter  Prior authorization for Nerlynx submitted on 10/03/2017 on cover my meds KEY: [id number] Prior authorization has been approved Effective dates: 10/03/2017- 10/02/2018  [facility] will be alerted of insurance approval.  Oral Oncology Clinic will continue to follow.  Johny Drilling, PharmD, BCPS, BCOP 10/05/2017 4:27 PM Oral Oncology Clinic [phone]

## 2017-10-16 ENCOUNTER — Other Ambulatory Visit: Payer: Self-pay | Admitting: Hematology and Oncology

## 2017-10-16 DIAGNOSIS — G4701 Insomnia due to medical condition: Secondary | ICD-10-CM

## 2017-10-16 DIAGNOSIS — Z170 Estrogen receptor positive status [ER+]: Principal | ICD-10-CM

## 2017-10-16 DIAGNOSIS — C50212 Malignant neoplasm of upper-inner quadrant of left female breast: Secondary | ICD-10-CM

## 2017-10-17 NOTE — Telephone Encounter (Signed)
Oral Oncology Patient Advocate Encounter  Received confirmation from [facility] that Miranda Woods has made arrangements to fill her prescription for Nerlynx on 11/02/2017.  It will be shipped to her home at that time for a copayment of $10.    Fabio Asa. Melynda Keller, [place] Patient [place] [phone] 10/17/2017 9:07 AM

## 2017-10-24 ENCOUNTER — Other Ambulatory Visit: Payer: Self-pay | Admitting: Adult Health

## 2017-10-31 ENCOUNTER — Encounter: Payer: Self-pay | Admitting: *Deleted

## 2017-10-31 ENCOUNTER — Inpatient Hospital Stay: Payer: BLUE CROSS/BLUE SHIELD | Attending: Hematology and Oncology

## 2017-10-31 ENCOUNTER — Inpatient Hospital Stay: Payer: BLUE CROSS/BLUE SHIELD

## 2017-10-31 ENCOUNTER — Telehealth: Payer: Self-pay | Admitting: Hematology and Oncology

## 2017-10-31 ENCOUNTER — Inpatient Hospital Stay (HOSPITAL_BASED_OUTPATIENT_CLINIC_OR_DEPARTMENT_OTHER): Payer: BLUE CROSS/BLUE SHIELD | Admitting: Hematology and Oncology

## 2017-10-31 VITALS — BP 132/85 | HR 68 | Resp 18 | Ht 65.0 in | Wt 121.9 lb

## 2017-10-31 DIAGNOSIS — C50212 Malignant neoplasm of upper-inner quadrant of left female breast: Secondary | ICD-10-CM

## 2017-10-31 DIAGNOSIS — Z79811 Long term (current) use of aromatase inhibitors: Secondary | ICD-10-CM | POA: Diagnosis not present

## 2017-10-31 DIAGNOSIS — Z170 Estrogen receptor positive status [ER+]: Secondary | ICD-10-CM | POA: Insufficient documentation

## 2017-10-31 DIAGNOSIS — Z95828 Presence of other vascular implants and grafts: Secondary | ICD-10-CM

## 2017-10-31 DIAGNOSIS — Z780 Asymptomatic menopausal state: Secondary | ICD-10-CM

## 2017-10-31 LAB — CBC WITH DIFFERENTIAL (CANCER CENTER ONLY)
BASOS ABS: 0 10*3/uL (ref 0.0–0.1)
Basophils Relative: 0 %
EOS PCT: 1 %
Eosinophils Absolute: 0 10*3/uL (ref 0.0–0.5)
HEMATOCRIT: 36.3 % (ref 34.8–46.6)
Hemoglobin: 12.2 g/dL (ref 11.6–15.9)
LYMPHS ABS: 0.5 10*3/uL — AB (ref 0.9–3.3)
LYMPHS PCT: 23 %
MCH: 31.6 pg (ref 25.1–34.0)
MCHC: 33.6 g/dL (ref 31.5–36.0)
MCV: 94 fL (ref 79.5–101.0)
Monocytes Absolute: 0.3 10*3/uL (ref 0.1–0.9)
Monocytes Relative: 12 %
NEUTROS ABS: 1.4 10*3/uL — AB (ref 1.5–6.5)
Neutrophils Relative %: 64 %
PLATELETS: 176 10*3/uL (ref 145–400)
RBC: 3.86 MIL/uL (ref 3.70–5.45)
RDW: 12.1 % (ref 11.2–14.5)
WBC: 2.3 10*3/uL — AB (ref 3.9–10.3)

## 2017-10-31 LAB — CMP (CANCER CENTER ONLY)
ALBUMIN: 3.5 g/dL (ref 3.5–5.0)
ALT: 12 U/L (ref 0–55)
ANION GAP: 8 (ref 3–11)
AST: 17 U/L (ref 5–34)
Alkaline Phosphatase: 59 U/L (ref 40–150)
BUN: 8 mg/dL (ref 7–26)
CO2: 26 mmol/L (ref 22–29)
Calcium: 8.9 mg/dL (ref 8.4–10.4)
Chloride: 106 mmol/L (ref 98–109)
Creatinine: 0.57 mg/dL — ABNORMAL LOW (ref 0.60–1.10)
GFR, Est AFR Am: >60 mL/min (ref >60)
Glucose, Bld: 104 mg/dL (ref 70–140)
POTASSIUM: 3.7 mmol/L (ref 3.5–5.1)
Sodium: 140 mmol/L (ref 136–145)
TOTAL PROTEIN: 6.3 g/dL — AB (ref 6.4–8.3)
Total Bilirubin: 0.3 mg/dL (ref 0.2–1.2)

## 2017-10-31 MED ORDER — HEPARIN SOD (PORK) LOCK FLUSH 100 UNIT/ML IV SOLN
500.0000 [IU] | Freq: Once | INTRAVENOUS | Status: AC | PRN
  Administered 2017-10-31: 500 [IU] via INTRAVENOUS
  Filled 2017-10-31: qty 5

## 2017-10-31 MED ORDER — CALCIUM-MAGNESIUM 300-300 MG PO TABS: 1.0000 | ORAL_TABLET | Freq: Every day | ORAL | 0 refills | Status: DC

## 2017-10-31 MED ORDER — SODIUM CHLORIDE 0.9% FLUSH
10.0000 mL | INTRAVENOUS | Status: DC | PRN
  Administered 2017-10-31: 10 mL via INTRAVENOUS
  Filled 2017-10-31: qty 10

## 2017-10-31 MED ORDER — BIOTIN 5 MG PO CAPS: 1.0000 | ORAL_CAPSULE | Freq: Every day | ORAL | 0 refills | Status: DC

## 2017-10-31 NOTE — Telephone Encounter (Signed)
Gave patient AVs and calendar of upcoming may appointments.  °

## 2017-10-31 NOTE — Patient Instructions (Signed)
Implanted Port Home Guide An implanted port is a type of central line that is placed under the skin. Central lines are used to provide IV access when treatment or nutrition needs to be given through a person's veins. Implanted ports are used for long-term IV access. An implanted port may be placed because:  You need IV medicine that would be irritating to the small veins in your hands or arms.  You need long-term IV medicines, such as antibiotics.  You need IV nutrition for a long period.  You need frequent blood draws for lab tests.  You need dialysis.  Implanted ports are usually placed in the chest area, but they can also be placed in the upper arm, the abdomen, or the leg. An implanted port has two main parts:  Reservoir. The reservoir is round and will appear as a small, raised area under your skin. The reservoir is the part where a needle is inserted to give medicines or draw blood.  Catheter. The catheter is a thin, flexible tube that extends from the reservoir. The catheter is placed into a large vein. Medicine that is inserted into the reservoir goes into the catheter and then into the vein.  How will I care for my incision site? Do not get the incision site wet. Bathe or shower as directed by your health care provider. How is my port accessed? Special steps must be taken to access the port:  Before the port is accessed, a numbing cream can be placed on the skin. This helps numb the skin over the port site.  Your health care provider uses a sterile technique to access the port. ? Your health care provider must put on a mask and sterile gloves. ? The skin over your port is cleaned carefully with an antiseptic and allowed to dry. ? The port is gently pinched between sterile gloves, and a needle is inserted into the port.  Only "non-coring" port needles should be used to access the port. Once the port is accessed, a blood return should be checked. This helps ensure that the port  is in the vein and is not clogged.  If your port needs to remain accessed for a constant infusion, a clear (transparent) bandage will be placed over the needle site. The bandage and needle will need to be changed every week, or as directed by your health care provider.  Keep the bandage covering the needle clean and dry. Do not get it wet. Follow your health care provider's instructions on how to take a shower or bath while the port is accessed.  If your port does not need to stay accessed, no bandage is needed over the port.  What is flushing? Flushing helps keep the port from getting clogged. Follow your health care provider's instructions on how and when to flush the port. Ports are usually flushed with saline solution or a medicine called heparin. The need for flushing will depend on how the port is used.  If the port is used for intermittent medicines or blood draws, the port will need to be flushed: ? After medicines have been given. ? After blood has been drawn. ? As part of routine maintenance.  If a constant infusion is running, the port may not need to be flushed.  How long will my port stay implanted? The port can stay in for as long as your health care provider thinks it is needed. When it is time for the port to come out, surgery will be   done to remove it. The procedure is similar to the one performed when the port was put in. When should I seek immediate medical care? When you have an implanted port, you should seek immediate medical care if:  You notice a bad smell coming from the incision site.  You have swelling, redness, or drainage at the incision site.  You have more swelling or pain at the port site or the surrounding area.  You have a fever that is not controlled with medicine.  This information is not intended to replace advice given to you by your health care provider. Make sure you discuss any questions you have with your health care provider. Document  Released: 07/17/2005 Document Revised: 12/23/2015 Document Reviewed: 03/24/2013 Elsevier Interactive Patient Education  2017 [employer]  

## 2017-10-31 NOTE — Progress Notes (Signed)
Patient Care Team: Lin Landsman, MD as PCP - General (Family Medicine) [name], MD as Consulting Physician (General Surgery) Nicholas Lose, MD as Consulting Physician (Hematology and Oncology) Eppie Gibson, MD as Attending Physician (Radiation Oncology) Gardenia Phlegm, NP as Nurse Practitioner (Hematology and Oncology)  DIAGNOSIS:  Encounter Diagnoses  Name Primary?  . Malignant neoplasm of upper-inner quadrant of left breast in female, estrogen receptor positive ([place])   . Post-menopausal Yes    SUMMARY OF ONCOLOGIC HISTORY:   Malignant neoplasm of upper-inner quadrant of left breast in female, estrogen receptor positive ([place])   09/14/2016 Initial Diagnosis    Palpable left breast mass for 2 months, ultrasound at 9:00: 4.7 x 3.6 x 3.3 cm, axilla negative: U/S Bx: Grade 2 IDC ER 95%, PR 50%, HER-2 positive ratio 6.17, Ki-67 20%, T2 N0 stage IIA clinical stage      09/27/2016 Breast MRI    Left breast: 3.5 cm irregular necrotic mass; left axillary lymph node 1.2 cm; right breast 0.6 cm mass at 6:00 position 0.5 cm mass right breast lower inner quadrant       10/11/2016 - 01/24/2017 Neo-Adjuvant Chemotherapy    Taxotere, Carboplatin, Herceptin, Perjeta x 6 cycles followed by Herceptin and Perjeta maintenance for one year      01/26/2017 Breast MRI    Left breast: Irregular enhancing mass measuring 2.7 cm, smaller than previous, no abnormal lymph nodes; right breast: No mass or enhancement       03/05/2017 Surgery    Left lumpectomy: IDC grade 2, 2.5 cm, margins negative, 0/2 lymph nodes negative, ER 100%, PR 50%, HER-2 positive ratio 4.88, T2 N0 stage IB AJCC 8       04/17/2017 Mammogram    2 groups of suspicious residual calcifications in the left breast spanning 4.8 cm one is anterior to lumpectomy the other is medial aspect of lumpectomy      05/21/2017 - 06/15/2017 Radiation Therapy    Adjuvant radiation therapy      07/11/2017 -  Anti-estrogen oral  therapy    Letrozole 2.5 mg daily       CHIEF COMPLIANT: Follow-up on letrozole  INTERVAL HISTORY: Miranda Woods is a 62 year old with above-mentioned history of left breast cancer treated with neoadjuvant chemotherapy followed by lumpectomy and radiation.  She is currently on letrozole therapy.  She has not started Neratinib at this time.  She would like to start it in the next couple of weeks.  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  She is anxious about the diarrhea side effect of Neratinib and wants to wait until those are complete.  REVIEW OF SYSTEMS:   Constitutional: Denies fevers, chills or abnormal weight loss Eyes: Denies blurriness of vision Ears, nose, mouth, throat, and face: Denies mucositis or sore throat Respiratory: Denies cough, dyspnea or wheezes Cardiovascular: Denies palpitation, chest discomfort Gastrointestinal:  Denies nausea, heartburn or change in bowel habits Skin: Denies abnormal skin rashes Lymphatics: Denies new lymphadenopathy or easy bruising Neurological:Denies numbness, tingling or new weaknesses Behavioral/Psych: Mood is stable, no new changes  Extremities: No lower extremity edema Breast:  denies any pain or lumps or nodules in either breasts All other systems were reviewed with the patient and are negative.  I have reviewed the past medical history, past surgical history, social history and family history with the patient and they are unchanged from previous note.  ALLERGIES:  is allergic to codeine.  MEDICATIONS:  Current Outpatient Medications  Medication Sig Dispense Refill  . Biotin 5 MG CAPS Take 1 capsule (5 mg total) by mouth daily.  0  . Calcium-Magnesium (CALCIUM MAGNESIUM 750) 300-300 MG TABS Take 1 tablet by mouth daily.  0  . Eszopiclone 3 MG TABS TAKE 1 TABLET BY MOUTH AT BEDTIME AS NEEDED FOR SLEEP. TAKE IMMEDIATELY BEFORE AT BEDTIME 30 tablet 0  . letrozole (FEMARA) 2.5 MG tablet Take 1 tablet  (2.5 mg total) by mouth daily. 90 tablet 3  . Melatonin 5 MG TABS Take 1 tablet (5 mg total) by mouth at bedtime as needed. 90 tablet 3  . Neratinib Maleate (NERLYNX) 40 MG tablet Take 6 tablets (240 mg total) by mouth daily. Take with food. 180 tablet 0  . omega-3 acid ethyl esters (LOVAZA) 1 g capsule Take by mouth 2 (two) times daily.    Marland Kitchen UNABLE TO FIND Med Name: Tonic Alcemy. One scoop every morning    . UNABLE TO FIND daily. Med Name: protandium herbal supplement    . UNABLE TO FIND daily. Med Name: tumeric     No current facility-administered medications for this visit.    Facility-Administered Medications Ordered in Other Visits  Medication Dose Route Frequency Provider Last Rate Last Dose  . sodium chloride flush (NS) 0.9 % injection 10 mL  10 mL Intravenous PRN Nicholas Lose, MD   10 mL at 01/24/17 1116  . sodium chloride flush (NS) 0.9 % injection 10 mL  10 mL Intravenous PRN Nicholas Lose, MD   10 mL at 02/14/17 1236    PHYSICAL EXAMINATION: ECOG PERFORMANCE STATUS: 1 - Symptomatic but completely ambulatory  Vitals:   10/31/17 1054  BP: 132/85  Pulse: 68  Resp: 18  SpO2: 99%   Filed Weights   10/31/17 1054  Weight: 121 lb 14.4 oz (55.3 kg)    GENERAL:alert, no distress and comfortable SKIN: skin color, texture, turgor are normal, no rashes or significant lesions EYES: normal, Conjunctiva are pink and non-injected, sclera clear OROPHARYNX:no exudate, no erythema and lips, buccal mucosa, and tongue normal  NECK: supple, thyroid normal size, non-tender, without nodularity LYMPH:  no palpable lymphadenopathy in the cervical, axillary or inguinal LUNGS: clear to auscultation and percussion with normal breathing effort HEART: regular rate & rhythm and no murmurs and no lower extremity edema ABDOMEN:abdomen soft, non-tender and normal bowel sounds MUSCULOSKELETAL:no cyanosis of digits and no clubbing  NEURO: alert & oriented x 3 with fluent speech, no focal motor/sensory  deficits EXTREMITIES: No lower extremity edema BREAST: No palpable masses or nodules in either right or left breasts. No palpable axillary supraclavicular or infraclavicular adenopathy no breast tenderness or nipple discharge. (exam performed in the presence of a chaperone)  LABORATORY DATA:  I have reviewed the data as listed CMP Latest Ref Rng & Units 10/03/2017 09/12/2017 08/22/2017  Glucose 70 - 140 mg/dL 125 133 109  BUN 7 - 26 mg/dL _0 Creatinine 0.60 - 1.10 mg/dL 0.63 0.66 0.61  Sodium 136 - 145 mmol/L 140 141 139  Potassium 3.5 - 5.1 mmol/L 4.1 3.8 3.9  Chloride 98 - 109 mmol/L 106 105 104  CO2 22 - 29 mmol/L _1 Calcium 8.4 - 10.4 mg/dL 9.6 9.2 8.9  Total Protein 6.4 - 8.3 g/dL 6.6 6.7 6.2(L)  Total Bilirubin 0.2 - 1.2 mg/dL 0.6 0.4 <0.2(L)  Alkaline Phos 40 - 150 U/L 62 69 64  AST 5 - 34 U/L _2 ALT 0 - 55 U/L  _0 Lab Results  Component Value Date   WBC 2.3 (L) 10/31/2017   HGB 12.2 10/03/2017   HCT 36.3 10/31/2017   MCV 94.0 10/31/2017   PLT 176 10/31/2017   NEUTROABS 1.4 (L) 10/31/2017    ASSESSMENT & PLAN:  Malignant neoplasm of upper-inner quadrant of left breast in female, estrogen receptor positive ([facility]) Palpable left breast mass for 2 months, ultrasound at 9:00: 4.7 x 3.6 x 3.3 cm, axilla negative: U/S Bx: Grade 2 IDC ER 95%, PR 50%, HER-2 positive ratio 6.17, Ki-67 20%,  Right breast biopsy: PASH  03/05/2017 Left lumpectomy: IDC grade 2, 2.5 cm, margins negative, 0/2 lymph nodes negative, ER 100%, PR 50%, HER-2 positive ratio 4.88, T2 N0 stage IB AJCC 8  Treatment summary 1. Neoadjuvant chemotherapy with TCH Perjeta 6 started 10/11/2016 completed 01/24/2017 followed by Herceptinmaintenance for 1 year 2.Breast conserving surgery on 03/05/2017 3. Followed by adjuvant radiation therapy10/22/2018-06/09/2017 4. Followed by adjuvant antiestrogen therapy ---------------------------------------------------------------------- Current  treatment: letrozole letrozole Toxicities: Denies any major side effects to letrozole.  Does not have any hot flashes or myalgias. Neratinib will start in the next couple of weeks Left breast biopsy 09/27/2017: Fibrocystic changes no evidence of malignancy Leukopenia: Recently she had a viral gastrointestinal infection.  I suspect that the low counts are related to that.  We will repeat this in May 2019. Patient is planning a trip to [place] and [place]  Return to clinic in the middle of May for follow-up    Orders Placed This Encounter  Procedures  . DG Bone Density    Standing Status:   Future    Standing Expiration Date:   12/05/2018    Order Specific Question:   Reason for exam:    Answer:   Post menopausal    Order Specific Question:   Preferred imaging location?    Answer:   [facility]  . CBC with Differential (Cancer Center Only)    Standing Status:   Future    Standing Expiration Date:   11/01/2018  . CMP ([place] only)    Standing Status:   Future    Standing Expiration Date:   11/01/2018   The patient has a good understanding of the overall plan. she agrees with it. she will call with any problems that may develop before the next visit here.   Harriette Ohara, MD 10/31/17

## 2017-10-31 NOTE — Assessment & Plan Note (Signed)
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 Neoadjuvant chemotherapy with TCH Perjeta 6 started 10/11/2016 completed 01/24/2017 followed by Herceptinmaintenance for 1 year 2.Breast conserving surgery on 03/05/2017 3. Followed by adjuvant radiation therapy10/22/2018-06/09/2017 4. Followed by adjuvant antiestrogen therapy ---------------------------------------------------------------------- Current treatment: Neratinib with letrozole letrozole Toxicities: Denies any major side effects to letrozole.  Does not have any hot flashes or myalgias.  Left breast biopsy 09/27/2017: Fibrocystic changes no evidence of malignancy  Neratinib toxicities:  Patient tells me that she is planning to go to [place] and immersed herself in their culture in May 2019.  Return to clinic in 2 months for follow-up

## 2017-11-30 ENCOUNTER — Telehealth: Payer: Self-pay | Admitting: Pharmacist

## 2017-11-30 NOTE — Telephone Encounter (Signed)
Oral Chemotherapy Pharmacist Encounter   I spoke with patient for overview of: Nerlynx (neratinib) for the maintenance adjuvant treatment of triple positive breast cancer, after completion of Herceptin, planned duration one year.  I had spoken with patient back in March for initial counseling and acquisition issues for Nerlynx start at that time. Was contacted early April from the dispensing pharmacy ([facility]) that patient had requested new delivery date of first shipment of Nerlynx on 11/07/2017.  Patient stated today (11/30/2017) that she had never heard back from the dispensing pharmacy and had not called them to schedule delivery of her medication. Patient called today to discuss Nerlynx start.  Patient provided phone number to [facility] (ph: [phone]). Patient will call the dispensing pharmacy today to schedule delivery. It is likely that will be delivered to patient's home tomorrow, and she will start her Nerlynx on Monday. Patient will call the office if she runs into any issues with Nerlynx acquisition.  Last Herceptin infusion: 10/03/2017  Counseled patient on administration, dosing, side effects, monitoring, drug-food interactions, safe handling, storage, and disposal.  When we originally spoke, patient was going to start Nerlynx at full dose, 6 tablets (240 mg) by mouth once daily with food. We had extensively discussed diarrhea prophylaxis with loperamide.  After discussion today, patient will initiate Nerlynx on a dose titration schedule. Antidiarrheal prophylaxis will not be used.   Patient will use loperamide as needed if symptoms of diarrhea occur.  Week 1: Patient will take Nerlynx 40mg  tablets, 3 tablets (120mg ) by mouth once daily with food Week 2: Patient will take Nerlynx 40mg  tablets, 4 tablets (160mg ) by mouth once daily with food Week 3: Patient will take Nerlynx 40mg  tablets, 5 tablets (200mg ) by mouth once daily with  food Week 4: Patient will take Nerlynx 40mg  tablets, 6 tablets (240mg ) by mouth once daily with food. 240 mg once daily is the target dose of Nerlynx.  Patient knows to avoid grapefruit and grapefruit juice while on treatment with Nerlynx.  Patient instructed to avoid use of PPIs or H2RAs while on treatment with Nerlynx, can separate antacids from Nerlynx by 3 hours if acid suppression is needed.  Nerlynx start date: 12/03/17  Side effects include but not limited to: diarrhea, nausea, vomiting, mouth sores, fatigue, rash, and abdominal pain.    Patient instructed to increase or decrease loperamide dosing to maintain 1-2 bowel movements per day  Reviewed with patient importance of keeping a medication schedule and plan for any missed doses.  Ms. Scioli voiced understanding and appreciation.   All questions answered. Medication reconciliation performed and medication/allergy list updated.  Confirmed office visits on 12/12/2017 with patient.  Patient knows to call the office with questions or concerns. Oral Oncology Clinic will continue to follow.  Thank you,  Johny Drilling, PharmD, BCPS, BCOP 11/30/2017   10:53 AM Oral Oncology Clinic [phone]

## 2017-12-05 ENCOUNTER — Telehealth: Payer: Self-pay | Admitting: Pharmacist

## 2017-12-05 NOTE — Telephone Encounter (Signed)
Oral Chemotherapy Pharmacist Encounter   I spoke with patient for overview of: Nerlynx. Patient states she has received her 1st shipment and wanted to go over dosing schedule one more time.  Last Herceptin infusion: 10/03/2017  Counseled patienton administration, dosing, side effects, monitoring, drug-food interactions, safe handling, storage, and disposal.  Patient will initiate Nerlynx on a dose titration schedule. Antidiarrheal prophylaxis will not be used.   Patient will use loperamide as needed if symptoms of diarrhea occur.  Week 1: Patient will take Nerlynx 40mg  tablets, 3 tablets (120mg ) by mouth once daily with food Week 2: Patient will take Nerlynx 40mg  tablets, 4 tablets (160mg ) by mouth once daily with food Week 3: Patient will take Nerlynx 40mg  tablets, 5 tablets (200mg ) by mouth once daily with food Week 4: Patient will take Nerlynx 40mg  tablets, 6 tablets (240mg ) by mouth once daily with food. 240 mg once daily is the target dose of Nerlynx.  Patient knows to avoid grapefruit and grapefruit juice while on treatment with Nerlynx.  Patient instructed to avoid use of PPIs or H2RAs while on treatment with Nerlynx, can separate antacids from Nerlynx by 3 hours if acid suppression is needed.  Nerlynx start date: 12/06/17  Side effects include but not limited to: diarrhea, nausea, vomiting, mouth sores, fatigue, rash, and abdominal pain.    Patient instructed to increase or decrease loperamide dosing to maintain 1-2 bowel movements per day  Reviewed with patient importance of keeping a medication schedule and plan for any missed doses.  Miranda Woods voiced understanding and appreciation.   All questions answered. Medication reconciliation performed and medication/allergy list updated.  Confirmed office visits on 12/12/2017 with patient.  Patient knows to call the office with questions or concerns. Oral Oncology Clinic will continue to follow.  Thank  you,  Johny Drilling, PharmD, BCPS, BCOP 11/30/2017   10:53 AM Oral Oncology Clinic [phone]

## 2017-12-12 ENCOUNTER — Inpatient Hospital Stay (HOSPITAL_BASED_OUTPATIENT_CLINIC_OR_DEPARTMENT_OTHER): Payer: BLUE CROSS/BLUE SHIELD | Admitting: Hematology and Oncology

## 2017-12-12 ENCOUNTER — Inpatient Hospital Stay: Payer: BLUE CROSS/BLUE SHIELD | Attending: Hematology and Oncology

## 2017-12-12 ENCOUNTER — Telehealth: Payer: Self-pay

## 2017-12-12 ENCOUNTER — Ambulatory Visit
Admission: RE | Admit: 2017-12-12 | Discharge: 2017-12-12 | Disposition: A | Discharge status: Home / Self Care | Payer: BLUE CROSS/BLUE SHIELD | Source: Ambulatory Visit | Attending: Hematology and Oncology | Admitting: Hematology and Oncology

## 2017-12-12 ENCOUNTER — Encounter: Payer: Self-pay | Admitting: *Deleted

## 2017-12-12 ENCOUNTER — Telehealth: Payer: Self-pay | Admitting: Hematology and Oncology

## 2017-12-12 ENCOUNTER — Telehealth: Payer: Self-pay | Admitting: Pharmacist

## 2017-12-12 DIAGNOSIS — R197 Diarrhea, unspecified: Secondary | ICD-10-CM | POA: Diagnosis not present

## 2017-12-12 DIAGNOSIS — Z170 Estrogen receptor positive status [ER+]: Secondary | ICD-10-CM | POA: Diagnosis not present

## 2017-12-12 DIAGNOSIS — C50212 Malignant neoplasm of upper-inner quadrant of left female breast: Secondary | ICD-10-CM | POA: Insufficient documentation

## 2017-12-12 DIAGNOSIS — D72819 Decreased white blood cell count, unspecified: Secondary | ICD-10-CM | POA: Insufficient documentation

## 2017-12-12 DIAGNOSIS — Z780 Asymptomatic menopausal state: Secondary | ICD-10-CM

## 2017-12-12 DIAGNOSIS — G4701 Insomnia due to medical condition: Secondary | ICD-10-CM | POA: Insufficient documentation

## 2017-12-12 DIAGNOSIS — Z79811 Long term (current) use of aromatase inhibitors: Secondary | ICD-10-CM | POA: Insufficient documentation

## 2017-12-12 LAB — CBC WITH DIFFERENTIAL/PLATELET
BASOS ABS: 0 10*3/uL (ref 0.0–0.1)
BASOS PCT: 1 %
Eosinophils Absolute: 0.1 10*3/uL (ref 0.0–0.5)
Eosinophils Relative: 3 %
HEMATOCRIT: 41 % (ref 34.8–46.6)
HEMOGLOBIN: 13.9 g/dL (ref 11.6–15.9)
LYMPHS PCT: 33 %
Lymphs Abs: 1.1 10*3/uL (ref 0.9–3.3)
MCH: 32.1 pg (ref 25.1–34.0)
MCHC: 33.9 g/dL (ref 31.5–36.0)
MCV: 94.7 fL (ref 79.5–101.0)
Monocytes Absolute: 0.4 10*3/uL (ref 0.1–0.9)
Monocytes Relative: 11 %
NEUTROS ABS: 1.7 10*3/uL (ref 1.5–6.5)
NEUTROS PCT: 52 %
Platelets: 204 10*3/uL (ref 145–400)
RBC: 4.33 MIL/uL (ref 3.70–5.45)
RDW: 12.1 % (ref 11.2–14.5)
WBC: 3.2 10*3/uL — ABNORMAL LOW (ref 3.9–10.3)

## 2017-12-12 LAB — COMPREHENSIVE METABOLIC PANEL
ALBUMIN: 4.3 g/dL (ref 3.5–5.0)
ALK PHOS: 64 U/L (ref 40–150)
ALT: 16 U/L (ref 0–55)
AST: 18 U/L (ref 5–34)
Anion gap: 5 (ref 3–11)
BILIRUBIN TOTAL: 0.4 mg/dL (ref 0.2–1.2)
BUN: 11 mg/dL (ref 7–26)
CALCIUM: 9.4 mg/dL (ref 8.4–10.4)
CO2: 29 mmol/L (ref 22–29)
CREATININE: 0.64 mg/dL (ref 0.60–1.10)
Chloride: 105 mmol/L (ref 98–109)
GFR calc Af Amer: >60 mL/min (ref >60)
GFR calc non Af Amer: >60 mL/min (ref >60)
GLUCOSE: 84 mg/dL (ref 70–140)
Potassium: 3.9 mmol/L (ref 3.5–5.1)
Sodium: 139 mmol/L (ref 136–145)
TOTAL PROTEIN: 7 g/dL (ref 6.4–8.3)

## 2017-12-12 MED ORDER — BUDESONIDE ER 9 MG PO TB24: 9.0000 mg | ORAL_TABLET | Freq: Every day | ORAL | 2 refills | Status: DC

## 2017-12-12 MED ORDER — ESZOPICLONE 2 MG PO TABS: 2.0000 mg | ORAL_TABLET | Freq: Every evening | ORAL | 3 refills | Status: DC | PRN

## 2017-12-12 NOTE — Assessment & Plan Note (Signed)
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 Neoadjuvant chemotherapy with TCH Perjeta 6 started 10/11/2016 completed 01/24/2017 followed by Herceptinmaintenance for 1 year 2.Breast conserving surgery on 03/05/2017 3. Followed by adjuvant radiation therapy10/22/2018-06/09/2017 4. Followed by adjuvant antiestrogen therapy ---------------------------------------------------------------------- Current treatment: letrozole letrozole Toxicities:Denies any major side effects to letrozole. Does not have any hot flashes or myalgias.  Neratinib toxicities:  Leukopenia: Lab review from today:  Patient is planning a trip to [place]  Return to clinic in 2 months for follow-up

## 2017-12-12 NOTE — Telephone Encounter (Signed)
Per Dr Lindi Adie, he would like Miranda Woods in oral chemo to get with patient in regards to a voucher for budesonide (Uceris) for diarrhea and needs correct dosing for prescription for pt.  Sent Miranda Woods an inbasket message and see her note in regards to budesonide dosing and voucher.

## 2017-12-12 NOTE — Progress Notes (Signed)
Patient Care Team: Patient, No Pcp Per as PCP - General (General Practice) [name], MD as Consulting Physician (General Surgery) Nicholas Lose, MD as Consulting Physician (Hematology and Oncology) Eppie Gibson, MD as Attending Physician (Radiation Oncology) Gardenia Phlegm, NP as Nurse Practitioner (Hematology and Oncology)  DIAGNOSIS:  Encounter Diagnoses  Name Primary?  . Malignant neoplasm of upper-inner quadrant of left breast in female, estrogen receptor positive ([facility])   . Insomnia due to medical condition     SUMMARY OF ONCOLOGIC HISTORY:   Malignant neoplasm of upper-inner quadrant of left breast in female, estrogen receptor positive ([place])   09/14/2016 Initial Diagnosis    Palpable left breast mass for 2 months, ultrasound at 9:00: 4.7 x 3.6 x 3.3 cm, axilla negative: U/S Bx: Grade 2 IDC ER 95%, PR 50%, HER-2 positive ratio 6.17, Ki-67 20%, T2 N0 stage IIA clinical stage      09/27/2016 Breast MRI    Left breast: 3.5 cm irregular necrotic mass; left axillary lymph node 1.2 cm; right breast 0.6 cm mass at 6:00 position 0.5 cm mass right breast lower inner quadrant       10/11/2016 - 01/24/2017 Neo-Adjuvant Chemotherapy    Taxotere, Carboplatin, Herceptin, Perjeta x 6 cycles followed by Herceptin and Perjeta maintenance for one year      01/26/2017 Breast MRI    Left breast: Irregular enhancing mass measuring 2.7 cm, smaller than previous, no abnormal lymph nodes; right breast: No mass or enhancement       03/05/2017 Surgery    Left lumpectomy: IDC grade 2, 2.5 cm, margins negative, 0/2 lymph nodes negative, ER 100%, PR 50%, HER-2 positive ratio 4.88, T2 N0 stage IB AJCC 8       04/17/2017 Mammogram    2 groups of suspicious residual calcifications in the left breast spanning 4.8 cm one is anterior to lumpectomy the other is medial aspect of lumpectomy      05/21/2017 - 06/15/2017 Radiation Therapy    Adjuvant radiation therapy      07/11/2017 -   Anti-estrogen oral therapy    Letrozole 2.5 mg daily; Neratinib started 11/28/2017       CHIEF COMPLIANT: Follow-up on Neratinib along with letrozole  INTERVAL HISTORY: ADALEEN HULGAN is a 62 year old with above-mentioned history of HER-2 positive ER positive left breast cancer treated with lumpectomy after finishing neoadjuvant chemotherapy.  She underwent adjuvant radiation and is currently on letrozole as well as Neratinib which was started on 11/28/2017.  She is here for toxicity evaluation of Neratinib.  She started taking 3 tablets of Neratinib initially and tolerated it well.  She increase up to 4 tablets and had 1 day of diarrhea.  She took Imodium for that and it subsided.  She is planning to continue with the 4 tablets and then slowly go up on the dosage again.  REVIEW OF SYSTEMS:   Constitutional: Denies fevers, chills or abnormal weight loss Eyes: Denies blurriness of vision Ears, nose, mouth, throat, and face: Denies mucositis or sore throat Respiratory: Denies cough, dyspnea or wheezes Cardiovascular: Denies palpitation, chest discomfort Gastrointestinal: Intermittent diarrhea Skin: Denies abnormal skin rashes Lymphatics: Denies new lymphadenopathy or easy bruising Neurological:Denies numbness, tingling or new weaknesses Behavioral/Psych: Mood is stable, no new changes  Extremities: No lower extremity edema  All other systems were reviewed with the patient and are negative.  I have reviewed the past medical history, past surgical history, social history and family history with the patient and they are unchanged from  previous note.  ALLERGIES:  is allergic to codeine.  MEDICATIONS:  Current Outpatient Medications  Medication Sig Dispense Refill  . Biotin 5 MG CAPS Take 1 capsule (5 mg total) by mouth daily.  0  . Budesonide ER (UCERIS) 9 MG TB24 Take 9 mg by mouth daily. 30 tablet 2  . Calcium-Magnesium (CALCIUM MAGNESIUM 750) 300-300 MG TABS Take 1 tablet by mouth daily.   0  . eszopiclone (LUNESTA) 2 MG TABS tablet Take 1 tablet (2 mg total) by mouth at bedtime as needed for sleep. Take immediately before bedtime 30 tablet 3  . Eszopiclone 3 MG TABS TAKE 1 TABLET BY MOUTH AT BEDTIME AS NEEDED FOR SLEEP. TAKE IMMEDIATELY BEFORE AT BEDTIME 30 tablet 0  . letrozole (FEMARA) 2.5 MG tablet Take 1 tablet (2.5 mg total) by mouth daily. 90 tablet 3  . Melatonin 5 MG TABS Take 1 tablet (5 mg total) by mouth at bedtime as needed. 90 tablet 3  . Neratinib Maleate (NERLYNX) 40 MG tablet Take 6 tablets (240 mg total) by mouth daily. Take with food. 180 tablet 0  . omega-3 acid ethyl esters (LOVAZA) 1 g capsule Take by mouth 2 (two) times daily.    Marland Kitchen UNABLE TO FIND Med Name: Tonic Alcemy. One scoop every morning    . UNABLE TO FIND daily. Med Name: protandium herbal supplement    . UNABLE TO FIND daily. Med Name: tumeric     No current facility-administered medications for this visit.    Facility-Administered Medications Ordered in Other Visits  Medication Dose Route Frequency Provider Last Rate Last Dose  . sodium chloride flush (NS) 0.9 % injection 10 mL  10 mL Intravenous PRN Nicholas Lose, MD   10 mL at 01/24/17 1116  . sodium chloride flush (NS) 0.9 % injection 10 mL  10 mL Intravenous PRN Nicholas Lose, MD   10 mL at 02/14/17 1236    PHYSICAL EXAMINATION: ECOG PERFORMANCE STATUS: 1 - Symptomatic but completely ambulatory  Vitals:   12/12/17 1111  BP: 118/70  Pulse: 69  Resp: 17  Temp: 98.4 F (36.9 C)  SpO2: 99%   Filed Weights   12/12/17 1111  Weight: 121 lb 3.2 oz (55 kg)    GENERAL:alert, no distress and comfortable SKIN: skin color, texture, turgor are normal, no rashes or significant lesions EYES: normal, Conjunctiva are pink and non-injected, sclera clear OROPHARYNX:no exudate, no erythema and lips, buccal mucosa, and tongue normal  NECK: supple, thyroid normal size, non-tender, without nodularity LYMPH:  no palpable lymphadenopathy in the  cervical, axillary or inguinal LUNGS: clear to auscultation and percussion with normal breathing effort HEART: regular rate & rhythm and no murmurs and no lower extremity edema ABDOMEN:abdomen soft, non-tender and normal bowel sounds MUSCULOSKELETAL:no cyanosis of digits and no clubbing  NEURO: alert & oriented x 3 with fluent speech, no focal motor/sensory deficits EXTREMITIES: No lower extremity edema  LABORATORY DATA:  I have reviewed the data as listed CMP Latest Ref Rng & Units 12/12/2017 10/31/2017 10/03/2017  Glucose 70 - 140 mg/dL 84 104 125  BUN 7 - 26 mg/dL '11 8 15  '$ Creatinine 0.60 - 1.10 mg/dL 0.64 0.57(L) 0.63  Sodium 136 - 145 mmol/L 139 140 140  Potassium 3.5 - 5.1 mmol/L 3.9 3.7 4.1  Chloride 98 - 109 mmol/L 105 106 106  CO2 22 - 29 mmol/L '29 26 26  '$ Calcium 8.4 - 10.4 mg/dL 9.4 8.9 9.6  Total Protein 6.4 - 8.3 g/dL 7.0 6.3(L) 6.6  Total Bilirubin 0.2 - 1.2 mg/dL 0.4 0.3 0.6  Alkaline Phos 40 - 150 U/L 64 59 62  AST 5 - 34 U/L '18 17 14  '$ ALT 0 - 55 U/L '16 12 13    '$ Lab Results  Component Value Date   WBC 3.2 (L) 12/12/2017   HGB 13.9 12/12/2017   HCT 41.0 12/12/2017   MCV 94.7 12/12/2017   PLT 204 12/12/2017   NEUTROABS 1.7 12/12/2017    ASSESSMENT & PLAN:  Malignant neoplasm of upper-inner quadrant of left breast in female, estrogen receptor positive ([facility]) Palpable left breast mass for 2 months, ultrasound at 9:00: 4.7 x 3.6 x 3.3 cm, axilla negative: U/S Bx: Grade 2 IDC ER 95%, PR 50%, HER-2 positive ratio 6.17, Ki-67 20%,  Right breast biopsy: PASH  03/05/2017 Left lumpectomy: IDC grade 2, 2.5 cm, margins negative, 0/2 lymph nodes negative, ER 100%, PR 50%, HER-2 positive ratio 4.88, T2 N0 stage IB AJCC 8  Treatment summary 1. Neoadjuvant chemotherapy with TCH Perjeta 6 started 10/11/2016 completed 01/24/2017 followed by Herceptinmaintenance for 1 year 2.Breast conserving surgery on 03/05/2017 3. Followed by adjuvant radiation  therapy10/22/2018-06/09/2017 4. Followed by adjuvant antiestrogen therapy ---------------------------------------------------------------------- Current treatment: letrozole letrozole Toxicities:Denies any major side effects to letrozole. Does not have any hot flashes or myalgias.  Neratinib toxicities: Diarrhea: Intermittently.  She is slowly increasing the dosage I recommended that she take budesonide orally to prevent diarrhea for the next 28 days. We will investigate how we can get this medicine paid for by the drug company.  Leukopenia: Lab review from today: WBC 3.2, ANC 1.7  Patient is planning a trip to [place] and [place] in June 2019  Return to clinic in 6 weeks for follow-up    No orders of the defined types were placed in this encounter.  The patient has a good understanding of the overall plan. she agrees with it. she will call with any problems that may develop before the next visit here.   Harriette Ohara, MD 12/12/17

## 2017-12-12 NOTE — Telephone Encounter (Signed)
Oral Chemotherapy Pharmacist Encounter  Follow-Up Form  Spoke with patient today when she was in the office for follow-up regarding patient's oral chemotherapy medication: Nerlynx  Original Start date of oral chemotherapy: 12/06/17  Patient started on Nerlynx 40 mg tablets, 3 tablets (120 mg) by mouth once daily with food.  On 12/11/2017 patient increased to 4 tablets (160 mg) by mouth once daily with food.  Pt reports the following side effects:  Patient started to experience loose stools and diarrhea almost immediately with Nerlynx initiation. She has been using loperamide to manage her symptoms. Discussed with MD adding budesonide to loperamide for increased control of diarrhea symptoms.  Patient instructed to wait a full 7 days prior to increasing Nerlynx to 5 tablets (200 mg) by mouth once daily with food in order to fully assess any symptoms from dose increase.  Patient was instructed that oral oncology clinic will follow up with her regarding budesonide prescription and any copayment issues.  Prescription for budesonide (Uceris) 9mg  tablets, 1 tablet once daily, #30 with 2 refills e-scribed to patient's local pharmacy. Oral Oncology Patient Advocate has obtained billing information for manufacturer voucher to cover 3 month supply of budesonide. Billing information will be emailed to patient for her to present to dispensing pharmacy of budesonide.  I tried to leave patient a message with above information about budesonide, however, mailbox was full and I was unable to leave message. Information about budesonide Rx was sent in email to patient with voucher billing info.  Oral Oncology Clinic will continue to follow.  Thank you,  Johny Drilling, PharmD, BCPS, BCOP  12/12/2017 1:39 PM Oral Oncology Clinic [phone]

## 2017-12-12 NOTE — Telephone Encounter (Signed)
Gave patient AVs and calendar of upcoming July appointments. Patient scheduled 7/10 due to being out of town for vacation 7/3.

## 2017-12-13 ENCOUNTER — Telehealth: Payer: Self-pay

## 2017-12-13 ENCOUNTER — Encounter: Payer: Self-pay | Admitting: Pharmacist

## 2017-12-13 ENCOUNTER — Telehealth: Payer: Self-pay | Admitting: Pharmacist

## 2017-12-13 NOTE — Telephone Encounter (Signed)
Outgoing call to patient as I could see that Miranda Woods was unable to reach her per last telephone note.  Spoke with Miranda Woods and she said she actually ran into [place] yesterday.  So I just reminded that the medication for diarrhea was escribed to her pharmacy and per Miranda Woods: "Oral Oncology Patient Advocate has obtained billing information for manufacturer voucher to cover 3 month supply of budesonide.  Billing information will be emailed to patient for her to present to dispensing pharmacy of budesonide."  Miranda Woods voiced understanding that she would need that information to take with her to her pharmacy.  Miranda Woods said she hasn't received yet so I made sure we have have her correct email:  [email or web address]  I contacted pharmacy Miranda Woods and they will email Miranda Woods voucher info again and/or use mychart.

## 2017-12-13 NOTE — Telephone Encounter (Signed)
Oral Oncology Pharmacist Encounter  Billing information for voucher for budesonide Gaetano Hawthorne) emailed to patient. Information also sent through MyChart message.  Patient informed that budesonide prescription (Uceris) was e-scribed to the [facility] on [address] yesterday (12/12/17).  Patient will need to present this billing info to the dispensing pharmacy for them to cover any out of pocket cost that may be due from patient for the Uceris.   ID: [id number]  BIN: [id number]  PCN: [id number]  Group: [id number]   Oral Oncology Clinic will continue to follow.  Johny Drilling, PharmD, BCPS, BCOP  12/13/2017 1:28 PM Oral Oncology Clinic [phone]

## 2018-01-01 ENCOUNTER — Other Ambulatory Visit: Payer: Self-pay

## 2018-01-01 DIAGNOSIS — Z170 Estrogen receptor positive status [ER+]: Principal | ICD-10-CM

## 2018-01-01 DIAGNOSIS — C50212 Malignant neoplasm of upper-inner quadrant of left female breast: Secondary | ICD-10-CM

## 2018-01-01 MED ORDER — NERATINIB MALEATE 40 MG PO TABS: 240.0000 mg | ORAL_TABLET | Freq: Every day | ORAL | 0 refills | Status: DC

## 2018-01-24 ENCOUNTER — Other Ambulatory Visit: Payer: Self-pay

## 2018-01-24 DIAGNOSIS — Z170 Estrogen receptor positive status [ER+]: Principal | ICD-10-CM

## 2018-01-24 DIAGNOSIS — C50212 Malignant neoplasm of upper-inner quadrant of left female breast: Secondary | ICD-10-CM

## 2018-01-24 MED ORDER — NERATINIB MALEATE 40 MG PO TABS: 240.0000 mg | ORAL_TABLET | Freq: Every day | ORAL | 0 refills | Status: DC

## 2018-02-08 ENCOUNTER — Inpatient Hospital Stay (HOSPITAL_BASED_OUTPATIENT_CLINIC_OR_DEPARTMENT_OTHER): Payer: BLUE CROSS/BLUE SHIELD | Admitting: Hematology and Oncology

## 2018-02-08 ENCOUNTER — Telehealth: Payer: Self-pay | Admitting: Hematology and Oncology

## 2018-02-08 ENCOUNTER — Inpatient Hospital Stay: Payer: BLUE CROSS/BLUE SHIELD | Attending: Hematology and Oncology

## 2018-02-08 DIAGNOSIS — Z79811 Long term (current) use of aromatase inhibitors: Secondary | ICD-10-CM | POA: Diagnosis not present

## 2018-02-08 DIAGNOSIS — Z170 Estrogen receptor positive status [ER+]: Secondary | ICD-10-CM | POA: Insufficient documentation

## 2018-02-08 DIAGNOSIS — C50212 Malignant neoplasm of upper-inner quadrant of left female breast: Secondary | ICD-10-CM | POA: Diagnosis present

## 2018-02-08 LAB — CMP (CANCER CENTER ONLY)
ALBUMIN: 4.4 g/dL (ref 3.5–5.0)
ALK PHOS: 79 U/L (ref 38–126)
ALT: 15 U/L (ref 0–44)
AST: 17 U/L (ref 15–41)
Anion gap: 9 (ref 5–15)
BILIRUBIN TOTAL: 0.5 mg/dL (ref 0.3–1.2)
BUN: 12 mg/dL (ref 6–20)
CALCIUM: 9.5 mg/dL (ref 8.9–10.3)
CO2: 28 mmol/L (ref 22–32)
Chloride: 104 mmol/L (ref 98–111)
Creatinine: 0.72 mg/dL (ref 0.44–1.00)
GFR, Est AFR Am: >60 mL/min (ref >60)
GFR, Estimated: >60 mL/min (ref >60)
GLUCOSE: 70 mg/dL (ref 70–99)
Potassium: 4 mmol/L (ref 3.5–5.1)
Sodium: 141 mmol/L (ref 135–145)
TOTAL PROTEIN: 7.3 g/dL (ref 6.5–8.1)

## 2018-02-08 LAB — CBC WITH DIFFERENTIAL (CANCER CENTER ONLY)
BASOS ABS: 0 10*3/uL (ref 0.0–0.1)
BASOS PCT: 1 %
EOS PCT: 11 %
Eosinophils Absolute: 0.3 10*3/uL (ref 0.0–0.5)
HCT: 41.3 % (ref 34.8–46.6)
Hemoglobin: 13.9 g/dL (ref 11.6–15.9)
Lymphocytes Relative: 30 %
Lymphs Abs: 0.9 10*3/uL (ref 0.9–3.3)
MCH: 31.6 pg (ref 25.1–34.0)
MCHC: 33.7 g/dL (ref 31.5–36.0)
MCV: 93.9 fL (ref 79.5–101.0)
MONO ABS: 0.2 10*3/uL (ref 0.1–0.9)
Monocytes Relative: 8 %
Neutro Abs: 1.5 10*3/uL (ref 1.5–6.5)
Neutrophils Relative %: 50 %
Platelet Count: 204 10*3/uL (ref 145–400)
RBC: 4.4 MIL/uL (ref 3.70–5.45)
RDW: 12.4 % (ref 11.2–14.5)
WBC Count: 3 10*3/uL — ABNORMAL LOW (ref 3.9–10.3)

## 2018-02-08 NOTE — Assessment & Plan Note (Signed)
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 Neoadjuvant chemotherapy with TCH Perjeta 6 started 10/11/2016 completed 01/24/2017 followed by Herceptinmaintenance for 1 year 2.Breast conserving surgery on 03/05/2017 3. Followed by adjuvant radiation therapy10/22/2018-06/09/2017 4. Followed by adjuvant antiestrogen therapy with letrozole started 07/11/2017; Neratinib started 11/28/2017  ---------------------------------------------------------------------- Current treatment:letrozole and Neratinib letrozole Toxicities:Denies any major side effects to letrozole. Does not have any hot flashes or myalgias.  Neratinib toxicities: Diarrhea: Intermittently.  She came back from a trip to [place] and had a wonderful time.  Return to clinic in 3 months for follow-up  

## 2018-02-08 NOTE — Progress Notes (Signed)
Patient Care Team: Patient, No Pcp Per as PCP - General (General Practice) [name], MD as Consulting Physician (General Surgery) Nicholas Lose, MD as Consulting Physician (Hematology and Oncology) Eppie Gibson, MD as Attending Physician (Radiation Oncology) Gardenia Phlegm, NP as Nurse Practitioner (Hematology and Oncology)  DIAGNOSIS:  Encounter Diagnosis  Name Primary?  . Malignant neoplasm of upper-inner quadrant of left breast in female, estrogen receptor positive ([place])     SUMMARY OF ONCOLOGIC HISTORY:   Malignant neoplasm of upper-inner quadrant of left breast in female, estrogen receptor positive ([place])   09/14/2016 Initial Diagnosis    Palpable left breast mass for 2 months, ultrasound at 9:00: 4.7 x 3.6 x 3.3 cm, axilla negative: U/S Bx: Grade 2 IDC ER 95%, PR 50%, HER-2 positive ratio 6.17, Ki-67 20%, T2 N0 stage IIA clinical stage      09/27/2016 Breast MRI    Left breast: 3.5 cm irregular necrotic mass; left axillary lymph node 1.2 cm; right breast 0.6 cm mass at 6:00 position 0.5 cm mass right breast lower inner quadrant       10/11/2016 - 01/24/2017 Neo-Adjuvant Chemotherapy    Taxotere, Carboplatin, Herceptin, Perjeta x 6 cycles followed by Herceptin and Perjeta maintenance for one year      01/26/2017 Breast MRI    Left breast: Irregular enhancing mass measuring 2.7 cm, smaller than previous, no abnormal lymph nodes; right breast: No mass or enhancement       03/05/2017 Surgery    Left lumpectomy: IDC grade 2, 2.5 cm, margins negative, 0/2 lymph nodes negative, ER 100%, PR 50%, HER-2 positive ratio 4.88, T2 N0 stage IB AJCC 8       04/17/2017 Mammogram    2 groups of suspicious residual calcifications in the left breast spanning 4.8 cm one is anterior to lumpectomy the other is medial aspect of lumpectomy      05/21/2017 - 06/15/2017 Radiation Therapy    Adjuvant radiation therapy      07/11/2017 -  Anti-estrogen oral therapy    Letrozole  2.5 mg daily; Neratinib started 11/28/2017       CHIEF COMPLIANT: Follow-up on letrozole and Neratinib  INTERVAL HISTORY: Miranda Woods is a 12-year with above-mentioned history of breast cancer treated with lumpectomy followed by adjuvant radiation and is currently on antiestrogen therapy with letrozole and Neratinib.  She is able to tolerate both of these treatments reasonably well.  She went to [place] for several weeks and had a wonderful time.  During that time she cut down the Neratinib to 4 tablets daily.  She is currently started on 5 tablets a day and it appears that once every 2 weeks she has a very bad episode of severe diarrhea.  Other than that she is able to tolerate the treatment fairly well.  Does not have any hot flashes or myalgias to letrozole therapy.  REVIEW OF SYSTEMS:   Constitutional: Denies fevers, chills or abnormal weight loss Eyes: Denies blurriness of vision Ears, nose, mouth, throat, and face: Denies mucositis or sore throat Respiratory: Denies cough, dyspnea or wheezes Cardiovascular: Denies palpitation, chest discomfort Gastrointestinal:  Denies nausea, heartburn or change in bowel habits Skin: Denies abnormal skin rashes Lymphatics: Denies new lymphadenopathy or easy bruising Neurological:Denies numbness, tingling or new weaknesses Behavioral/Psych: Mood is stable, no new changes  Extremities: No lower extremity edema Breast:  denies any pain or lumps or nodules in either breasts All other systems were reviewed with the patient and are negative.  I  have reviewed the past medical history, past surgical history, social history and family history with the patient and they are unchanged from previous note.  ALLERGIES:  is allergic to codeine.  MEDICATIONS:  Current Outpatient Medications  Medication Sig Dispense Refill  . Biotin 5 MG CAPS Take 1 capsule (5 mg total) by mouth daily.  0  . Calcium-Magnesium (CALCIUM MAGNESIUM 750) 300-300 MG TABS Take 1  tablet by mouth daily.  0  . eszopiclone (LUNESTA) 2 MG TABS tablet Take 1 tablet (2 mg total) by mouth at bedtime as needed for sleep. Take immediately before bedtime 30 tablet 3  . letrozole (FEMARA) 2.5 MG tablet Take 1 tablet (2.5 mg total) by mouth daily. 90 tablet 3  . Melatonin 5 MG TABS Take 1 tablet (5 mg total) by mouth at bedtime as needed. 90 tablet 3  . Neratinib Maleate (NERLYNX) 40 MG tablet Take 6 tablets (240 mg total) by mouth daily. Take with food. 180 tablet 0  . omega-3 acid ethyl esters (LOVAZA) 1 g capsule Take by mouth 2 (two) times daily.    Marland Kitchen UNABLE TO FIND Med Name: Tonic Alcemy. One scoop every morning    . UNABLE TO FIND daily. Med Name: protandium herbal supplement    . UNABLE TO FIND daily. Med Name: tumeric     No current facility-administered medications for this visit.    Facility-Administered Medications Ordered in Other Visits  Medication Dose Route Frequency Provider Last Rate Last Dose  . sodium chloride flush (NS) 0.9 % injection 10 mL  10 mL Intravenous PRN Nicholas Lose, MD   10 mL at 01/24/17 1116  . sodium chloride flush (NS) 0.9 % injection 10 mL  10 mL Intravenous PRN Nicholas Lose, MD   10 mL at 02/14/17 1236    PHYSICAL EXAMINATION: ECOG PERFORMANCE STATUS: 1 - Symptomatic but completely ambulatory  Vitals:   02/08/18 1005  BP: 120/80  Pulse: 66  Resp: 19  Temp: 97.6 F (36.4 C)  SpO2: 100%   Filed Weights   02/08/18 1005  Weight: 121 lb 4.8 oz (55 kg)    GENERAL:alert, no distress and comfortable SKIN: skin color, texture, turgor are normal, no rashes or significant lesions EYES: normal, Conjunctiva are pink and non-injected, sclera clear OROPHARYNX:no exudate, no erythema and lips, buccal mucosa, and tongue normal  NECK: supple, thyroid normal size, non-tender, without nodularity LYMPH:  no palpable lymphadenopathy in the cervical, axillary or inguinal LUNGS: clear to auscultation and percussion with normal breathing  effort HEART: regular rate & rhythm and no murmurs and no lower extremity edema ABDOMEN:abdomen soft, non-tender and normal bowel sounds MUSCULOSKELETAL:no cyanosis of digits and no clubbing  NEURO: alert & oriented x 3 with fluent speech, no focal motor/sensory deficits EXTREMITIES: No lower extremity edema BREAST: No palpable masses or nodules in either right or left breasts. No palpable axillary supraclavicular or infraclavicular adenopathy no breast tenderness or nipple discharge. (exam performed in the presence of a chaperone)  LABORATORY DATA:  I have reviewed the data as listed CMP Latest Ref Rng & Units 02/08/2018 12/12/2017 10/31/2017  Glucose 70 - 99 mg/dL 70 84 104  BUN 6 - 20 mg/dL '12 11 8  '$ Creatinine 0.44 - 1.00 mg/dL 0.72 0.64 0.57(L)  Sodium 135 - 145 mmol/L 141 139 140  Potassium 3.5 - 5.1 mmol/L 4.0 3.9 3.7  Chloride 98 - 111 mmol/L 104 105 106  CO2 22 - 32 mmol/L '28 29 26  '$ Calcium 8.9 - 10.3 mg/dL  9.5 9.4 8.9  Total Protein 6.5 - 8.1 g/dL 7.3 7.0 6.3(L)  Total Bilirubin 0.3 - 1.2 mg/dL 0.5 0.4 0.3  Alkaline Phos 38 - 126 U/L 79 64 59  AST 15 - 41 U/L '17 18 17  '$ ALT 0 - 44 U/L '15 16 12    '$ Lab Results  Component Value Date   WBC 3.0 (L) 02/08/2018   HGB 13.9 02/08/2018   HCT 41.3 02/08/2018   MCV 93.9 02/08/2018   PLT 204 02/08/2018   NEUTROABS 1.5 02/08/2018    ASSESSMENT & PLAN:  Malignant neoplasm of upper-inner quadrant of left breast in female, estrogen receptor positive ([facility]) Palpable left breast mass for 2 months, ultrasound at 9:00: 4.7 x 3.6 x 3.3 cm, axilla negative: U/S Bx: Grade 2 IDC ER 95%, PR 50%, HER-2 positive ratio 6.17, Ki-67 20%,  Right breast biopsy: PASH  03/05/2017 Left lumpectomy: IDC grade 2, 2.5 cm, margins negative, 0/2 lymph nodes negative, ER 100%, PR 50%, HER-2 positive ratio 4.88, T2 N0 stage IB AJCC 8  Treatment summary 1. Neoadjuvant chemotherapy with TCH Perjeta 6 started 10/11/2016 completed 01/24/2017 followed by  Herceptinmaintenance for 1 year 2.Breast conserving surgery on 03/05/2017 3. Followed by adjuvant radiation therapy10/22/2018-06/09/2017 4. Followed by adjuvant antiestrogen therapy with letrozole started 07/11/2017; Neratinib started 11/28/2017  ---------------------------------------------------------------------- Current treatment:letrozole and Neratinib letrozole Toxicities:Denies any major side effects to letrozole. Does not have any hot flashes or myalgias.  Neratinib toxicities: Diarrhea: Intermittently.  She came back from a trip to [place] and had a wonderful time.  Return to clinic in 3 months for follow-up   Orders Placed This Encounter  Procedures  . CBC with Differential (Cancer Center Only)    Standing Status:   Future    Standing Expiration Date:   02/09/2019  . CMP ([facility] only)    Standing Status:   Future    Standing Expiration Date:   02/09/2019   The patient has a good understanding of the overall plan. she agrees with it. she will call with any problems that may develop before the next visit here.   Harriette Ohara, MD 02/08/18

## 2018-02-08 NOTE — Telephone Encounter (Signed)
Patient declined avs and calendar of upcoming appts

## 2018-04-02 ENCOUNTER — Telehealth: Payer: Self-pay | Admitting: *Deleted

## 2018-04-02 NOTE — Telephone Encounter (Signed)
Patient called to request appointment change.  Rescheduled to 05/10/18 at 10am.

## 2018-05-03 ENCOUNTER — Ambulatory Visit: Payer: BLUE CROSS/BLUE SHIELD | Admitting: Hematology and Oncology

## 2018-05-03 ENCOUNTER — Other Ambulatory Visit: Payer: BLUE CROSS/BLUE SHIELD

## 2018-05-08 ENCOUNTER — Encounter: Payer: Self-pay | Admitting: Family Medicine

## 2018-05-08 ENCOUNTER — Ambulatory Visit: Payer: BLUE CROSS/BLUE SHIELD | Admitting: Family Medicine

## 2018-05-08 ENCOUNTER — Other Ambulatory Visit: Payer: Self-pay

## 2018-05-08 VITALS — BP 123/76 | HR 70 | Temp 97.6°F | Ht 65.0 in | Wt 120.6 lb

## 2018-05-08 DIAGNOSIS — N3001 Acute cystitis with hematuria: Secondary | ICD-10-CM

## 2018-05-08 DIAGNOSIS — Z1211 Encounter for screening for malignant neoplasm of colon: Secondary | ICD-10-CM

## 2018-05-08 DIAGNOSIS — R350 Frequency of micturition: Secondary | ICD-10-CM | POA: Diagnosis not present

## 2018-05-08 LAB — POCT URINALYSIS DIP (MANUAL ENTRY)
NITRITE UA: POSITIVE — AB
Spec Grav, UA: <=1.005 — AB (ref 1.010–1.025)
UROBILINOGEN UA: 4 U/dL — AB
pH, UA: 5 (ref 5.0–8.0)

## 2018-05-08 LAB — POC MICROSCOPIC URINALYSIS (UMFC): MUCUS RE: ABSENT

## 2018-05-08 MED ORDER — NITROFURANTOIN MONOHYD MACRO 100 MG PO CAPS: 100.0000 mg | ORAL_CAPSULE | Freq: Two times a day (BID) | ORAL | 0 refills | Status: DC

## 2018-05-08 MED ORDER — PHENAZOPYRIDINE HCL 100 MG PO TABS: 100.0000 mg | ORAL_TABLET | Freq: Three times a day (TID) | ORAL | 0 refills | Status: DC | PRN

## 2018-05-08 NOTE — Patient Instructions (Addendum)
  Start Macrobid twice daily for antibiotic, drink plenty fluids, Pyridium if needed.  I did send some to your pharmacy, but you do not have to get that filled if you already have some.  Symptoms should be improving the next few days.  If any fevers, nausea vomiting or worsening symptoms follow-up for recheck.  I will refer you for colonoscopy, but please follow-up for physical and we can review other health maintenance.   Thank you for coming in today  Urinary Tract Infection, Adult A urinary tract infection (UTI) is an infection of any part of the urinary tract. The urinary tract includes the:  Kidneys.  Ureters.  Bladder.  Urethra.  These organs make, store, and get rid of pee (urine) in the body. Follow these instructions at home:  Take over-the-counter and prescription medicines only as told by your doctor.  If you were prescribed an antibiotic medicine, take it as told by your doctor. Do not stop taking the antibiotic even if you start to feel better.  Avoid the following drinks: ? Alcohol. ? Caffeine. ? Tea. ? Carbonated drinks.  Drink enough fluid to keep your pee clear or pale yellow.  Keep all follow-up visits as told by your doctor. This is important.  Make sure to: ? Empty your bladder often and completely. Do not to hold pee for long periods of time. ? Empty your bladder before and after sex. ? Wipe from front to back after a bowel movement if you are female. Use each tissue one time when you wipe. Contact a doctor if:  You have back pain.  You have a fever.  You feel sick to your stomach (nauseous).  You throw up (vomit).  Your symptoms do not get better after 3 days.  Your symptoms go away and then come back. Get help right away if:  You have very bad back pain.  You have very bad lower belly (abdominal) pain.  You are throwing up and cannot keep down any medicines or water. This information is not intended to replace advice given to you by  your health care provider. Make sure you discuss any questions you have with your health care provider. Document Released: 01/03/2008 Document Revised: 12/23/2015 Document Reviewed: 06/07/2015 Elsevier Interactive Patient Education  [employer].     If you have lab work done today you will be contacted with your lab results within the next 2 weeks.  If you have not heard from [place] then please contact us. The fastest way to get your results is to register for My Chart.   IF you received an x-ray today, you will receive an invoice from [facility]. Please contact [facility] at [phone] with questions or concerns regarding your invoice.   IF you received labwork today, you will receive an invoice from [facility]. Please contact [facility] at [phone] with questions or concerns regarding your invoice.   Our billing staff will not be able to assist you with questions regarding bills from these companies.  You will be contacted with the lab results as soon as they are available. The fastest way to get your results is to activate your My Chart account. Instructions are located on the last page of this paperwork. If you have not heard from [place] regarding the results in 2 weeks, please contact this office.

## 2018-05-08 NOTE — Progress Notes (Signed)
Subjective:  By signing my name below, I, [name], attest that this documentation has been prepared under the direction and in the presence of [name], MD Electronically Signed: Ladene Artist, ED Scribe 05/08/2018 at 5:43 PM.   Patient ID: Miranda Woods, female    DOB: 1956/07/13, 62 y.o.   MRN: [id number]  Chief Complaint  Patient presents with  . Urinary Tract Infection Possibly  . Urinary Frequency    started this morning   . Abdominal Pain   HPI Miranda Woods is a 62 y.o. female who presents to Primary Care at [facility] complaining of urinary freq and abdominal pain since this morning. Currently under treatment for breast CA s/p surgery, chemo and radiation therapy in 2018. Currently on neratinib and letrozole for antigen antiestrogen therapy.  Pt reports urinary freq, urinary urgency, dysuria, lower abdominal discomfort upon waking this morning. Reports trying Azo around 11:30 AM today and drinking more water throughout the day without improvement in symptoms. Denies fever, nausea, vomiting. No antibiotic allergies.  Health Maintenance Pt has never had a colonoscopy. Agrees to referral at this time.  Pt works as a Management consultant.  Patient Active Problem List   Diagnosis Date Noted  . Rash 10/26/2016  . Port catheter in place 10/18/2016  . Malignant neoplasm of upper-inner quadrant of left breast in female, estrogen receptor positive ([place]) 09/19/2016  . ADHD (attention deficit hyperactivity disorder)   . Fibrocystic breast disease    Past Medical History:  Diagnosis Date  . ADHD (attention deficit hyperactivity disorder)   . Breast cancer ([place])    left 2018  . Cancer ([place]) 12/2016   left breast cancer  . Fibrocystic breast disease   . History of radiation therapy 05/21/17- 06/15/17   Left Breast/ 40.05 Gy in 15 fractions, Left Breast Boost/ 10 Gy in 5 fractions.   . Personal history of chemotherapy   . Personal history of radiation therapy    Past  Surgical History:  Procedure Laterality Date  . ANKLE FRACTURE SURGERY Left   . APPENDECTOMY    . BREAST BIOPSY    . BREAST LUMPECTOMY Left    2018  . BREAST LUMPECTOMY WITH RADIOACTIVE SEED AND SENTINEL LYMPH NODE BIOPSY Left 03/05/2017   Procedure: LEFT BREAST LUMPECTOMY WITH RADIOACTIVE SEED AND SENTINEL LYMPH NODE BIOPSY AND BLUE DYE INJECTION;  Surgeon: Rolm Bookbinder, MD;  Location: [facility];  Service: General;  Laterality: Left;  . BREAST LUMPECTOMY WITH RADIOACTIVE SEED LOCALIZATION Left 04/23/2017   Procedure: EXCISION OF LEFT BREAST SEED LOCALIZED MARGINS (TWO SEEDS);  Surgeon: Rolm Bookbinder, MD;  Location: [facility];  Service: General;  Laterality: Left;  . HUMERUS FRACTURE SURGERY Left   . IR GENERIC HISTORICAL  10/10/2016   IR US GUIDE VASC ACCESS RIGHT 10/10/2016 Jacqulynn Cadet, MD [facility]  . IR GENERIC HISTORICAL  10/10/2016   IR FLUORO GUIDE PORT INSERTION RIGHT 10/10/2016 Jacqulynn Cadet, MD [facility]  . ORIF WRIST FRACTURE    . TEMPOROMANDIBULAR JOINT SURGERY  1986   Allergies  Allergen Reactions  . Codeine Hives and Itching    Tolerates hydrocodone   Prior to Admission medications   Medication Sig Start Date End Date Taking? Authorizing Provider  Biotin 5 MG CAPS Take 1 capsule (5 mg total) by mouth daily. 10/31/17   Nicholas Lose, MD  Calcium-Magnesium (CALCIUM MAGNESIUM 750) 300-300 MG TABS Take 1 tablet by mouth daily. 10/31/17   Nicholas Lose, MD  eszopiclone Johnnye Sima) 2  MG TABS tablet Take 1 tablet (2 mg total) by mouth at bedtime as needed for sleep. Take immediately before bedtime 12/12/17   Nicholas Lose, MD  letrozole [facility]) 2.5 MG tablet Take 1 tablet (2.5 mg total) by mouth daily. 07/11/17   Nicholas Lose, MD  Melatonin 5 MG TABS Take 1 tablet (5 mg total) by mouth at bedtime as needed. 07/11/17   Nicholas Lose, MD  Neratinib Maleate (NERLYNX) 40 MG tablet Take 6 tablets (240 mg total) by mouth daily. Take with  food. 01/24/18   Nicholas Lose, MD  omega-3 acid ethyl esters (LOVAZA) 1 g capsule Take by mouth 2 (two) times daily.    [provider]  UNABLE TO FIND Med Name: Tonic Alcemy. One scoop every morning    [provider]  UNABLE TO FIND daily. Med Name: protandium herbal supplement    [provider]  UNABLE TO FIND daily. Med Name: tumeric    [provider]  prochlorperazine (COMPAZINE) 10 MG tablet Take 1 tablet (10 mg total) by mouth every 6 (six) hours as needed (Nausea or vomiting). Patient not taking: Reported on 11/28/2016 09/28/16 02/13/17  Nicholas Lose, MD   Social History   Socioeconomic History  . Marital status: Single    Spouse name: [name]  . Number of children: 0  . Years of education: Not on file  . Highest education level: Not on file  Occupational History  . Occupation: counselor  Social Needs  . Financial resource strain: Not on file  . Food insecurity:    Worry: Not on file    Inability: Not on file  . Transportation needs:    Medical: Not on file    Non-medical: Not on file  Tobacco Use  . Smoking status: Former Smoker    Types: Cigarettes  . Smokeless tobacco: Never Used  Substance and Sexual Activity  . Alcohol use: No    Comment: ocassional wine  . Drug use: No  . Sexual activity: Yes    Partners: Male    Birth control/protection: Post-menopausal  Lifestyle  . Physical activity:    Days per week: Not on file    Minutes per session: Not on file  . Stress: Not on file  Relationships  . Social connections:    Talks on phone: Not on file    Gets together: Not on file    Attends religious service: Not on file    Active member of club or organization: Not on file    Attends meetings of clubs or organizations: Not on file    Relationship status: Not on file  . Intimate partner violence:    Fear of current or ex partner: Not on file    Emotionally abused: Not on file    Physically abused: Not on file    Forced  sexual activity: Not on file  Other Topics Concern  . Not on file  Social History Narrative  . Not on file   Review of Systems  Constitutional: Negative for fever.  Gastrointestinal: Positive for abdominal pain. Negative for nausea and vomiting.  Genitourinary: Positive for dysuria, frequency and urgency.      Objective:   Physical Exam  Constitutional: She is oriented to person, place, and time. She appears well-developed and well-nourished. No distress.  HENT:  Head: Normocephalic and atraumatic.  Eyes: Conjunctivae and EOM are normal.  Neck: Neck supple. No tracheal deviation present.  Cardiovascular: Normal rate.  Pulmonary/Chest: Effort normal. No respiratory distress.  Abdominal: There is tenderness (minimal) in the suprapubic area. There is no rebound, no guarding and no CVA tenderness.  Musculoskeletal: Normal range of motion.  Neurological: She is alert and oriented to person, place, and time.  Skin: Skin is warm and dry.  Psychiatric: She has a normal mood and affect. Her behavior is normal.  Nursing note and vitals reviewed.  Vitals:   05/08/18 1721  BP: 123/76  Pulse: 70  Temp: 97.6 F (36.4 C)  TempSrc: Oral  SpO2: 99%  Weight: 120 lb 9.6 oz (54.7 kg)  Height: 5\' 5"  (1.651 m)   Results for orders placed or performed in visit on 05/08/18  POCT Microscopic Urinalysis ([facility])  Result Value Ref Range   WBC,UR,HPF,POC Moderate (A) None WBC/hpf   RBC,UR,HPF,POC Few (A) None RBC/hpf   Bacteria None None, Too numerous to count   Mucus Absent Absent   Epithelial Cells, UR Per Microscopy None None, Too numerous to count cells/hpf  POCT urinalysis dipstick  Result Value Ref Range   Color, UA red (A) yellow   Clarity, UA cloudy (A) clear   Glucose, UA =100 (A) negative mg/dL   Bilirubin, UA small (A) negative   Ketones, POC UA small (15) (A) negative mg/dL   Spec Grav, UA <=1.005 (A) 1.010 - 1.025   Blood, UA trace-lysed (A) negative   pH, UA 5.0 5.0 - 8.0    Protein Ur, POC =30 (A) negative mg/dL   Urobilinogen, UA 4.0 (A) 0.2 or 1.0 E.U./dL   Nitrite, UA Positive (A) Negative   Leukocytes, UA Large (3+) (A) Negative      Assessment & Plan:   JEWELS LANGONE is a 62 y.o. female Urinary frequency - Plan: POCT Microscopic Urinalysis ([facility]), POCT urinalysis dipstick Acute cystitis with hematuria - Plan: Urine Culture, nitrofurantoin, macrocrystal-monohydrate, (MACROBID) 100 MG capsule, phenazopyridine (PYRIDIUM) 100 MG tablet  -UTI with hematuria, no nausea/vomiting/fevers or upper tract symptoms.  Start Macrobid, check urine culture, continue Azo as needed and push fluids.  RTC precautions.    Special screening for malignant neoplasms, colon - Plan: Ambulatory referral to Gastroenterology  -Referred for screening colonoscopy, plans to follow-up for physical to review other health maintenance  Meds ordered this encounter  Medications  . nitrofurantoin, macrocrystal-monohydrate, (MACROBID) 100 MG capsule    Sig: Take 1 capsule (100 mg total) by mouth 2 (two) times daily.    Dispense:  14 capsule    Refill:  0  . phenazopyridine (PYRIDIUM) 100 MG tablet    Sig: Take 1 tablet (100 mg total) by mouth 3 (three) times daily as needed for pain.    Dispense:  10 tablet    Refill:  0   Patient Instructions    Start Macrobid twice daily for antibiotic, drink plenty fluids, Pyridium if needed.  I did send some to your pharmacy, but you do not have to get that filled if you already have some.  Symptoms should be improving the next few days.  If any fevers, nausea vomiting or worsening symptoms follow-up for recheck.  I will refer you for colonoscopy, but please follow-up for physical and we can review other health maintenance.   Thank you for coming in today  Urinary Tract Infection, Adult A urinary tract infection (UTI) is an infection of any part of the urinary tract. The urinary tract includes  the:  Kidneys.  Ureters.  Bladder.  Urethra.  These organs make, store, and get rid of pee (urine) in the body. Follow these  instructions at home:  Take over-the-counter and prescription medicines only as told by your doctor.  If you were prescribed an antibiotic medicine, take it as told by your doctor. Do not stop taking the antibiotic even if you start to feel better.  Avoid the following drinks: ? Alcohol. ? Caffeine. ? Tea. ? Carbonated drinks.  Drink enough fluid to keep your pee clear or pale yellow.  Keep all follow-up visits as told by your doctor. This is important.  Make sure to: ? Empty your bladder often and completely. Do not to hold pee for long periods of time. ? Empty your bladder before and after sex. ? Wipe from front to back after a bowel movement if you are female. Use each tissue one time when you wipe. Contact a doctor if:  You have back pain.  You have a fever.  You feel sick to your stomach (nauseous).  You throw up (vomit).  Your symptoms do not get better after 3 days.  Your symptoms go away and then come back. Get help right away if:  You have very bad back pain.  You have very bad lower belly (abdominal) pain.  You are throwing up and cannot keep down any medicines or water. This information is not intended to replace advice given to you by your health care provider. Make sure you discuss any questions you have with your health care provider. Document Released: 01/03/2008 Document Revised: 12/23/2015 Document Reviewed: 06/07/2015 Elsevier Interactive Patient Education  [employer].     If you have lab work done today you will be contacted with your lab results within the next 2 weeks.  If you have not heard from [place] then please contact us. The fastest way to get your results is to register for My Chart.   IF you received an x-ray today, you will receive an invoice from [facility]. Please contact [facility] at [phone] with questions or concerns regarding your invoice.   IF you received labwork today, you will receive an invoice from [place] [facility]. Please contact [facility] at [phone] with questions or concerns regarding your invoice.   Our billing staff will not be able to assist you with questions regarding bills from these companies.  You will be contacted with the lab results as soon as they are available. The fastest way to get your results is to activate your My Chart account. Instructions are located on the last page of this paperwork. If you have not heard from [place] regarding the results in 2 weeks, please contact this office.       I personally performed the services described in this documentation, which was scribed in my presence. The recorded information has been reviewed and considered for accuracy and completeness, addended by me as needed, and agree with information above.  Signed,   Merri Ray, MD Primary Care at [facility].  05/08/18 6:04 PM

## 2018-05-10 ENCOUNTER — Other Ambulatory Visit: Payer: BLUE CROSS/BLUE SHIELD

## 2018-05-10 ENCOUNTER — Ambulatory Visit: Payer: BLUE CROSS/BLUE SHIELD | Admitting: Hematology and Oncology

## 2018-05-10 LAB — URINE CULTURE

## 2018-05-17 ENCOUNTER — Other Ambulatory Visit: Payer: Self-pay

## 2018-05-17 ENCOUNTER — Ambulatory Visit: Payer: BLUE CROSS/BLUE SHIELD | Admitting: Family Medicine

## 2018-05-17 ENCOUNTER — Inpatient Hospital Stay: Payer: BLUE CROSS/BLUE SHIELD | Admitting: Hematology and Oncology

## 2018-05-17 ENCOUNTER — Encounter: Payer: Self-pay | Admitting: Family Medicine

## 2018-05-17 ENCOUNTER — Telehealth: Payer: Self-pay | Admitting: Hematology and Oncology

## 2018-05-17 ENCOUNTER — Inpatient Hospital Stay: Payer: BLUE CROSS/BLUE SHIELD | Attending: Hematology and Oncology

## 2018-05-17 VITALS — BP 112/68 | HR 66 | Temp 98.0°F | Ht 65.0 in | Wt 122.0 lb

## 2018-05-17 DIAGNOSIS — Z79811 Long term (current) use of aromatase inhibitors: Secondary | ICD-10-CM

## 2018-05-17 DIAGNOSIS — C50212 Malignant neoplasm of upper-inner quadrant of left female breast: Secondary | ICD-10-CM

## 2018-05-17 DIAGNOSIS — Z170 Estrogen receptor positive status [ER+]: Secondary | ICD-10-CM | POA: Insufficient documentation

## 2018-05-17 DIAGNOSIS — M654 Radial styloid tenosynovitis [de Quervain]: Secondary | ICD-10-CM

## 2018-05-17 LAB — CMP (CANCER CENTER ONLY)
ALBUMIN: 4.1 g/dL (ref 3.5–5.0)
ALK PHOS: 75 U/L (ref 38–126)
ALT: 11 U/L (ref 0–44)
ANION GAP: 9 (ref 5–15)
AST: 16 U/L (ref 15–41)
BUN: 11 mg/dL (ref 6–20)
CHLORIDE: 104 mmol/L (ref 98–111)
CO2: 26 mmol/L (ref 22–32)
Calcium: 9.6 mg/dL (ref 8.9–10.3)
Creatinine: 0.73 mg/dL (ref 0.44–1.00)
GFR, Est AFR Am: >60 mL/min (ref >60)
GFR, Estimated: >60 mL/min (ref >60)
GLUCOSE: 106 mg/dL — AB (ref 70–99)
POTASSIUM: 4.1 mmol/L (ref 3.5–5.1)
Sodium: 139 mmol/L (ref 135–145)
Total Bilirubin: 0.5 mg/dL (ref 0.3–1.2)
Total Protein: 7.3 g/dL (ref 6.5–8.1)

## 2018-05-17 LAB — CBC WITH DIFFERENTIAL (CANCER CENTER ONLY)
Abs Immature Granulocytes: 0.01 10*3/uL (ref 0.00–0.07)
BASOS ABS: 0 10*3/uL (ref 0.0–0.1)
Basophils Relative: 1 %
EOS PCT: 3 %
Eosinophils Absolute: 0.1 10*3/uL (ref 0.0–0.5)
HEMATOCRIT: 39.1 % (ref 36.0–46.0)
Hemoglobin: 13.2 g/dL (ref 12.0–15.0)
IMMATURE GRANULOCYTES: 0 %
LYMPHS ABS: 0.7 10*3/uL (ref 0.7–4.0)
Lymphocytes Relative: 20 %
MCH: 31.6 pg (ref 26.0–34.0)
MCHC: 33.8 g/dL (ref 30.0–36.0)
MCV: 93.5 fL (ref 80.0–100.0)
Monocytes Absolute: 0.4 10*3/uL (ref 0.1–1.0)
Monocytes Relative: 11 %
NEUTROS PCT: 65 %
Neutro Abs: 2.2 10*3/uL (ref 1.7–7.7)
PLATELETS: 241 10*3/uL (ref 150–400)
RBC: 4.18 MIL/uL (ref 3.87–5.11)
RDW: 11.7 % (ref 11.5–15.5)
WBC Count: 3.4 10*3/uL — ABNORMAL LOW (ref 4.0–10.5)
nRBC: 0 % (ref 0.0–0.2)

## 2018-05-17 NOTE — Progress Notes (Signed)
Patient Care Team: [name], MD as PCP - General (Family Medicine) [name], MD as Consulting Physician (General Surgery) Nicholas Lose, MD as Consulting Physician (Hematology and Oncology) Eppie Gibson, MD as Attending Physician (Radiation Oncology) Gardenia Phlegm, NP as Nurse Practitioner (Hematology and Oncology)  DIAGNOSIS:  Encounter Diagnosis  Name Primary?  . Malignant neoplasm of upper-inner quadrant of left breast in female, estrogen receptor positive ([place])     SUMMARY OF ONCOLOGIC HISTORY:   Malignant neoplasm of upper-inner quadrant of left breast in female, estrogen receptor positive ([place])   09/14/2016 Initial Diagnosis    Palpable left breast mass for 2 months, ultrasound at 9:00: 4.7 x 3.6 x 3.3 cm, axilla negative: U/S Bx: Grade 2 IDC ER 95%, PR 50%, HER-2 positive ratio 6.17, Ki-67 20%, T2 N0 stage IIA clinical stage    09/27/2016 Breast MRI    Left breast: 3.5 cm irregular necrotic mass; left axillary lymph node 1.2 cm; right breast 0.6 cm mass at 6:00 position 0.5 cm mass right breast lower inner quadrant     10/11/2016 - 01/24/2017 Neo-Adjuvant Chemotherapy    Taxotere, Carboplatin, Herceptin, Perjeta x 6 cycles followed by Herceptin and Perjeta maintenance for one year    01/26/2017 Breast MRI    Left breast: Irregular enhancing mass measuring 2.7 cm, smaller than previous, no abnormal lymph nodes; right breast: No mass or enhancement     03/05/2017 Surgery    Left lumpectomy: IDC grade 2, 2.5 cm, margins negative, 0/2 lymph nodes negative, ER 100%, PR 50%, HER-2 positive ratio 4.88, T2 N0 stage IB AJCC 8     04/17/2017 Mammogram    2 groups of suspicious residual calcifications in the left breast spanning 4.8 cm one is anterior to lumpectomy the other is medial aspect of lumpectomy    05/21/2017 - 06/15/2017 Radiation Therapy    Adjuvant radiation therapy    07/11/2017 -  Anti-estrogen oral therapy    Letrozole 2.5 mg daily;  Neratinib started 11/28/2017     CHIEF COMPLIANT: Follow-up on letrozole and Neratinib  INTERVAL HISTORY: Miranda Woods is a 62 year old with above-mentioned history of left breast cancer treated with neoadjuvant chemotherapy followed by lumpectomy and radiation and is currently on letrozole along with Neratinib.  He could go ahead and I think he is all right given some platelets unfortunately stuck  REVIEW OF SYSTEMS:   Constitutional: Denies fevers, chills or abnormal weight loss Eyes: Denies blurriness of vision Ears, nose, mouth, throat, and face: Denies mucositis or sore throat Respiratory: Denies cough, dyspnea or wheezes Cardiovascular: Denies palpitation, chest discomfort Gastrointestinal:  Denies nausea, heartburn or change in bowel habits Skin: Denies abnormal skin rashes Lymphatics: Denies new lymphadenopathy or easy bruising Neurological:Denies numbness, tingling or new weaknesses Behavioral/Psych: Mood is stable, no new changes  Extremities: No lower extremity edema Breast:  denies any pain or lumps or nodules in either breasts All other systems were reviewed with the patient and are negative.  I have reviewed the past medical history, past surgical history, social history and family history with the patient and they are unchanged from previous note.  ALLERGIES:  is allergic to codeine.  MEDICATIONS:  Current Outpatient Medications  Medication Sig Dispense Refill  . Biotin 5 MG CAPS Take 1 capsule (5 mg total) by mouth daily.  0  . Calcium-Magnesium (CALCIUM MAGNESIUM 750) 300-300 MG TABS Take 1 tablet by mouth daily.  0  . eszopiclone (LUNESTA) 2 MG TABS tablet Take 1 tablet (2  mg total) by mouth at bedtime as needed for sleep. Take immediately before bedtime 30 tablet 3  . letrozole (FEMARA) 2.5 MG tablet Take 1 tablet (2.5 mg total) by mouth daily. 90 tablet 3  . Melatonin 5 MG TABS Take 1 tablet (5 mg total) by mouth at bedtime as needed. 90 tablet 3  . Neratinib  Maleate (NERLYNX) 40 MG tablet Take 6 tablets (240 mg total) by mouth daily. Take with food. 180 tablet 0  . omega-3 acid ethyl esters (LOVAZA) 1 g capsule Take by mouth 2 (two) times daily.    Marland Kitchen UNABLE TO FIND Med Name: Tonic Alcemy. One scoop every morning    . UNABLE TO FIND daily. Med Name: protandium herbal supplement    . UNABLE TO FIND daily. Med Name: tumeric     No current facility-administered medications for this visit.    Facility-Administered Medications Ordered in Other Visits  Medication Dose Route Frequency Provider Last Rate Last Dose  . sodium chloride flush (NS) 0.9 % injection 10 mL  10 mL Intravenous PRN Nicholas Lose, MD   10 mL at 01/24/17 1116  . sodium chloride flush (NS) 0.9 % injection 10 mL  10 mL Intravenous PRN Nicholas Lose, MD   10 mL at 02/14/17 1236    PHYSICAL EXAMINATION: ECOG PERFORMANCE STATUS: 0 - Asymptomatic  Vitals:   05/17/18 1055  BP: 128/80  Pulse: 73  Resp: 18  Temp: 97.6 F (36.4 C)  SpO2: 100%   Filed Weights   05/17/18 1055  Weight: 119 lb 6.4 oz (54.2 kg)    GENERAL:alert, no distress and comfortable SKIN: skin color, texture, turgor are normal, no rashes or significant lesions EYES: normal, Conjunctiva are pink and non-injected, sclera clear OROPHARYNX:no exudate, no erythema and lips, buccal mucosa, and tongue normal  NECK: supple, thyroid normal size, non-tender, without nodularity LYMPH:  no palpable lymphadenopathy in the cervical, axillary or inguinal LUNGS: clear to auscultation and percussion with normal breathing effort HEART: regular rate & rhythm and no murmurs and no lower extremity edema ABDOMEN:abdomen soft, non-tender and normal bowel sounds MUSCULOSKELETAL:no cyanosis of digits and no clubbing  NEURO: alert & oriented x 3 with fluent speech, no focal motor/sensory deficits EXTREMITIES: No lower extremity edema BREAST: No palpable masses or nodules in either right or left breasts. No palpable axillary  supraclavicular or infraclavicular adenopathy no breast tenderness or nipple discharge. (exam performed in the presence of a chaperone)  LABORATORY DATA:  I have reviewed the data as listed CMP Latest Ref Rng & Units 05/17/2018 02/08/2018 12/12/2017  Glucose 70 - 99 mg/dL 106(H) 70 84  BUN 6 - 20 mg/dL '11 12 11  '$ Creatinine 0.44 - 1.00 mg/dL 0.73 0.72 0.64  Sodium 135 - 145 mmol/L 139 141 139  Potassium 3.5 - 5.1 mmol/L 4.1 4.0 3.9  Chloride 98 - 111 mmol/L 104 104 105  CO2 22 - 32 mmol/L '26 28 29  '$ Calcium 8.9 - 10.3 mg/dL 9.6 9.5 9.4  Total Protein 6.5 - 8.1 g/dL 7.3 7.3 7.0  Total Bilirubin 0.3 - 1.2 mg/dL 0.5 0.5 0.4  Alkaline Phos 38 - 126 U/L 75 79 64  AST 15 - 41 U/L '16 17 18  '$ ALT 0 - 44 U/L '11 15 16    '$ Lab Results  Component Value Date   WBC 3.4 (L) 05/17/2018   HGB 13.2 05/17/2018   HCT 39.1 05/17/2018   MCV 93.5 05/17/2018   PLT 241 05/17/2018   NEUTROABS 2.2 05/17/2018  ASSESSMENT & PLAN:  Malignant neoplasm of upper-inner quadrant of left breast in female, estrogen receptor positive ([place]) Palpable left breast mass for 2 months, ultrasound at 9:00: 4.7 x 3.6 x 3.3 cm, axilla negative: U/S Bx: Grade 2 IDC ER 95%, PR 50%, HER-2 positive ratio 6.17, Ki-67 20%,  Right breast biopsy: PASH  03/05/2017 Left lumpectomy: IDC grade 2, 2.5 cm, margins negative, 0/2 lymph nodes negative, ER 100%, PR 50%, HER-2 positive ratio 4.88, T2 N0 stage IB AJCC 8  Treatment summary 1. Neoadjuvant chemotherapy with TCH Perjeta 6 started 10/11/2016 completed 01/24/2017 followed by Herceptinmaintenance for 1 year 2.Breast conserving surgery on 03/05/2017 3. Followed by adjuvant radiation therapy10/22/2018-06/09/2017 4. Followed by adjuvant antiestrogen therapy with letrozole started 07/11/2017; Neratinib started 11/28/2017  ---------------------------------------------------------------------- Current treatment:letrozole and Neratinib letrozole Toxicities:Denies any major side  effects to letrozole. Occasional hot flashes and vaginal dryness I encouraged her to use Replens  Neratinib toxicities: Tolerating it very well Diarrhea: Intermittently.  She came back from a trip to [place] and had a wonderful time.  Return to clinic in 3 months for follow-up    No orders of the defined types were placed in this encounter.  The patient has a good understanding of the overall plan. she agrees with it. she will call with any problems that may develop before the next visit here.   Harriette Ohara, MD 05/17/18

## 2018-05-17 NOTE — Progress Notes (Signed)
Subjective:    Patient ID: Miranda Woods, female    DOB: 1956/05/16, 62 y.o.   MRN: [id number]  HPI Miranda Woods is a 62 y.o. female Presents today for: Chief Complaint  Patient presents with  . right wrist pain    4 months. started in june   R wrist pain: Had to work on reports and more typing in early June. One night was awakened with pain at thumb side of wrist.  NKI.  Tried otc thumb brace for a few weeks that helped. Improved some then restarted activity then symptoms returned.  Typing and writing for work. R hand dominant.   No pain relievers.   Patient Active Problem List   Diagnosis Date Noted  . Rash 10/26/2016  . Port catheter in place 10/18/2016  . Malignant neoplasm of upper-inner quadrant of left breast in female, estrogen receptor positive ([place]) 09/19/2016  . ADHD (attention deficit hyperactivity disorder)   . Fibrocystic breast disease    Past Medical History:  Diagnosis Date  . ADHD (attention deficit hyperactivity disorder)   . Breast cancer ([place])    left 2018  . Cancer ([place]) 12/2016   left breast cancer  . Fibrocystic breast disease   . History of radiation therapy 05/21/17- 06/15/17   Left Breast/ 40.05 Gy in 15 fractions, Left Breast Boost/ 10 Gy in 5 fractions.   . Personal history of chemotherapy   . Personal history of radiation therapy    Past Surgical History:  Procedure Laterality Date  . ANKLE FRACTURE SURGERY Left   . APPENDECTOMY    . BREAST BIOPSY    . BREAST LUMPECTOMY Left    2018  . BREAST LUMPECTOMY WITH RADIOACTIVE SEED AND SENTINEL LYMPH NODE BIOPSY Left 03/05/2017   Procedure: LEFT BREAST LUMPECTOMY WITH RADIOACTIVE SEED AND SENTINEL LYMPH NODE BIOPSY AND BLUE DYE INJECTION;  Surgeon: Rolm Bookbinder, MD;  Location: [facility];  Service: General;  Laterality: Left;  . BREAST LUMPECTOMY WITH RADIOACTIVE SEED LOCALIZATION Left 04/23/2017   Procedure: EXCISION OF LEFT BREAST SEED LOCALIZED MARGINS (TWO SEEDS);   Surgeon: Rolm Bookbinder, MD;  Location: [facility];  Service: General;  Laterality: Left;  . HUMERUS FRACTURE SURGERY Left   . IR GENERIC HISTORICAL  10/10/2016   IR US GUIDE VASC ACCESS RIGHT 10/10/2016 Jacqulynn Cadet, MD [facility]  . IR GENERIC HISTORICAL  10/10/2016   IR FLUORO GUIDE PORT INSERTION RIGHT 10/10/2016 Jacqulynn Cadet, MD [facility]  . ORIF WRIST FRACTURE    . TEMPOROMANDIBULAR JOINT SURGERY  1986   Allergies  Allergen Reactions  . Codeine Hives and Itching    Tolerates hydrocodone   Prior to Admission medications   Medication Sig Start Date End Date Taking? Authorizing Provider  Biotin 5 MG CAPS Take 1 capsule (5 mg total) by mouth daily. 10/31/17  Yes Nicholas Lose, MD  Calcium-Magnesium (CALCIUM MAGNESIUM 750) 300-300 MG TABS Take 1 tablet by mouth daily. 10/31/17  Yes Nicholas Lose, MD  eszopiclone (LUNESTA) 2 MG TABS tablet Take 1 tablet (2 mg total) by mouth at bedtime as needed for sleep. Take immediately before bedtime 12/12/17  Yes Nicholas Lose, MD  letrozole [facility]) 2.5 MG tablet Take 1 tablet (2.5 mg total) by mouth daily. 07/11/17  Yes Nicholas Lose, MD  Melatonin 5 MG TABS Take 1 tablet (5 mg total) by mouth at bedtime as needed. 07/11/17  Yes Nicholas Lose, MD  UNABLE TO FIND Med Name: Tonic Alcemy. One scoop every  morning   Yes [provider]  UNABLE TO FIND daily. Med Name: protandium herbal supplement   Yes [provider]  UNABLE TO FIND daily. Med Name: tumeric   Yes [provider]  prochlorperazine (COMPAZINE) 10 MG tablet Take 1 tablet (10 mg total) by mouth every 6 (six) hours as needed (Nausea or vomiting). Patient not taking: Reported on 11/28/2016 09/28/16 02/13/17  Nicholas Lose, MD   Social History   Socioeconomic History  . Marital status: Single    Spouse name: [name]  . Number of children: 0  . Years of education: Not on file  . Highest education level: Not on file  Occupational History    . Occupation: counselor  Social Needs  . Financial resource strain: Not on file  . Food insecurity:    Worry: Not on file    Inability: Not on file  . Transportation needs:    Medical: Not on file    Non-medical: Not on file  Tobacco Use  . Smoking status: Former Smoker    Types: Cigarettes  . Smokeless tobacco: Never Used  Substance and Sexual Activity  . Alcohol use: No    Comment: ocassional wine  . Drug use: No  . Sexual activity: Yes    Partners: Male    Birth control/protection: Post-menopausal  Lifestyle  . Physical activity:    Days per week: Not on file    Minutes per session: Not on file  . Stress: Not on file  Relationships  . Social connections:    Talks on phone: Not on file    Gets together: Not on file    Attends religious service: Not on file    Active member of club or organization: Not on file    Attends meetings of clubs or organizations: Not on file    Relationship status: Not on file  . Intimate partner violence:    Fear of current or ex partner: Not on file    Emotionally abused: Not on file    Physically abused: Not on file    Forced sexual activity: Not on file  Other Topics Concern  . Not on file  Social History Narrative  . Not on file    Review of Systems Per hpi    Objective:   Physical Exam  Constitutional: She is oriented to person, place, and time. She appears well-developed and well-nourished. No distress.  HENT:  Head: Normocephalic and atraumatic.  Cardiovascular: Normal rate.  Pulmonary/Chest: Effort normal.  Musculoskeletal:       Right wrist: She exhibits tenderness (Radial wrist at APL, EPB.  Positive Finkelstein's). She exhibits no bony tenderness, no swelling and no crepitus.  Neurological: She is alert and oriented to person, place, and time.  Psychiatric: She has a normal mood and affect.   Vitals:   05/17/18 1544  BP: 112/68  Pulse: 66  Temp: 98 F (36.7 C)  TempSrc: Oral  SpO2: 96%  Weight: 122 lb (55.3  kg)  Height: 5\' 5"  (1.651 m)       Assessment & Plan:  Miranda Woods is a 62 y.o. female De Quervain's tenosynovitis, right - Plan: Ambulatory referral to Hand Surgery  -Typical symptoms, exam of de Quervain's.  Likely with increased computer work.  Recommended voice dictation system to lessen typing, wrist splint from home should be sufficient, range of motion few times per day.  Will also refer to hand specialist given chronicity of symptoms to consider injection if not improving  with activity modification.  No orders of the defined types were placed in this encounter.  Patient Instructions   Continue brace and try to avoid use of thumb for now.  Range of motion out of brace ok few times per day.   I will refer you to hand specialist to evaluate the area and decide if injection needed.   Thanks for coming in today.    De Quervain Tenosynovitis Tendons attach muscles to bones. They also help with joint movements. When tendons become irritated or swollen, it is called tendinitis. The extensor pollicis brevis (EPB) tendon connects the EPB muscle to a bone that is near the base of the thumb. The EPB muscle helps to straighten and extend the thumb. De Quervain tenosynovitis is a condition in which the EPB tendon lining (sheath) becomes irritated, thickened, and swollen. This condition is sometimes called stenosing tenosynovitis. This condition causes pain on the thumb side of the back of the wrist. What are the causes? Causes of this condition include:  Activities that repeatedly cause your thumb and wrist to extend.  A sudden increase in activity or change in activity that affects your wrist.  What increases the risk? This condition is more likely to develop in:  Females.  People who have diabetes.  Women who have recently given birth.  People who are over 51 years of age.  People who do activities that involve repeated hand and wrist motions, such as tennis, racquetball,  volleyball, gardening, and taking care of children.  People who do heavy labor.  People who have poor wrist strength and flexibility.  People who do not warm up properly before activities.  What are the signs or symptoms? Symptoms of this condition include:  Pain or tenderness over the thumb side of the back of the wrist when your thumb and wrist are not moving.  Pain that gets worse when you straighten your thumb or extend your thumb or wrist.  Pain when the injured area is touched.  Locking or catching of the thumb joint while you bend and straighten your thumb.  Decreased thumb motion due to pain.  Swelling over the affected area.  How is this diagnosed? This condition is diagnosed with a medical history and physical exam. Your health care provider will ask for details about your injury and ask about your symptoms. How is this treated? Treatment may include the use of icing and medicines to reduce pain and swelling. You may also be advised to wear a splint or brace to limit your thumb and wrist motion. In less severe cases, treatment may also include working with a physical therapist to strengthen your wrist and calm the irritation around your EPB tendon sheath. In severe cases, surgery may be needed. Follow these instructions at home: If you have a splint or brace:  Wear it as told by your health care provider. Remove it only as told by your health care provider.  Loosen the splint or brace if your fingers become numb and tingle, or if they turn cold and blue.  Keep the splint or brace clean and dry. Managing pain, stiffness, and swelling  If directed, apply ice to the injured area. ? Put ice in a plastic bag. ? Place a towel between your skin and the bag. ? Leave the ice on for 20 minutes, 2-3 times per day.  Move your fingers often to avoid stiffness and to lessen swelling.  Raise (elevate) the injured area above the level of your heart while  you are sitting or lying  down. General instructions  Return to your normal activities as told by your health care provider. Ask your health care provider what activities are safe for you.  Take over-the-counter and prescription medicines only as told by your health care provider.  Keep all follow-up visits as told by your health care provider. This is important.  Do not drive or operate heavy machinery while taking prescription pain medicine. Contact a health care provider if:  Your pain, tenderness, or swelling gets worse, even if you have had treatment.  You have numbness or tingling in your wrist, hand, or fingers on the injured side. This information is not intended to replace advice given to you by your health care provider. Make sure you discuss any questions you have with your health care provider. Document Released: 07/17/2005 Document Revised: 12/23/2015 Document Reviewed: 09/22/2014 Elsevier Interactive Patient Education  [employer].   If you have lab work done today you will be contacted with your lab results within the next 2 weeks.  If you have not heard from [place] then please contact us. The fastest way to get your results is to register for My Chart.   IF you received an x-ray today, you will receive an invoice from [facility]. Please contact [facility] at [phone] with questions or concerns regarding your invoice.   IF you received labwork today, you will receive an invoice from [facility]. Please contact [facility] at [phone] with questions or concerns regarding your invoice.   Our billing staff will not be able to assist you with questions regarding bills from these companies.  You will be contacted with the lab results as soon as they are available. The fastest way to get your results is to activate your My Chart account. Instructions are located on the last page of this paperwork. If you have not heard from [place] regarding the results in 2 weeks, please contact this  office.      Signed,   Merri Ray, MD [facility].  05/21/18 3:10 PM

## 2018-05-17 NOTE — Patient Instructions (Addendum)
Continue brace and try to avoid use of thumb for now.  Range of motion out of brace ok few times per day.   I will refer you to hand specialist to evaluate the area and decide if injection needed.   Thanks for coming in today.    De Quervain Tenosynovitis Tendons attach muscles to bones. They also help with joint movements. When tendons become irritated or swollen, it is called tendinitis. The extensor pollicis brevis (EPB) tendon connects the EPB muscle to a bone that is near the base of the thumb. The EPB muscle helps to straighten and extend the thumb. De Quervain tenosynovitis is a condition in which the EPB tendon lining (sheath) becomes irritated, thickened, and swollen. This condition is sometimes called stenosing tenosynovitis. This condition causes pain on the thumb side of the back of the wrist. What are the causes? Causes of this condition include:  Activities that repeatedly cause your thumb and wrist to extend.  A sudden increase in activity or change in activity that affects your wrist.  What increases the risk? This condition is more likely to develop in:  Females.  People who have diabetes.  Women who have recently given birth.  People who are over 22 years of age.  People who do activities that involve repeated hand and wrist motions, such as tennis, racquetball, volleyball, gardening, and taking care of children.  People who do heavy labor.  People who have poor wrist strength and flexibility.  People who do not warm up properly before activities.  What are the signs or symptoms? Symptoms of this condition include:  Pain or tenderness over the thumb side of the back of the wrist when your thumb and wrist are not moving.  Pain that gets worse when you straighten your thumb or extend your thumb or wrist.  Pain when the injured area is touched.  Locking or catching of the thumb joint while you bend and straighten your thumb.  Decreased thumb motion due to  pain.  Swelling over the affected area.  How is this diagnosed? This condition is diagnosed with a medical history and physical exam. Your health care provider will ask for details about your injury and ask about your symptoms. How is this treated? Treatment may include the use of icing and medicines to reduce pain and swelling. You may also be advised to wear a splint or brace to limit your thumb and wrist motion. In less severe cases, treatment may also include working with a physical therapist to strengthen your wrist and calm the irritation around your EPB tendon sheath. In severe cases, surgery may be needed. Follow these instructions at home: If you have a splint or brace:  Wear it as told by your health care provider. Remove it only as told by your health care provider.  Loosen the splint or brace if your fingers become numb and tingle, or if they turn cold and blue.  Keep the splint or brace clean and dry. Managing pain, stiffness, and swelling  If directed, apply ice to the injured area. ? Put ice in a plastic bag. ? Place a towel between your skin and the bag. ? Leave the ice on for 20 minutes, 2-3 times per day.  Move your fingers often to avoid stiffness and to lessen swelling.  Raise (elevate) the injured area above the level of your heart while you are sitting or lying down. General instructions  Return to your normal activities as told by your health care  provider. Ask your health care provider what activities are safe for you.  Take over-the-counter and prescription medicines only as told by your health care provider.  Keep all follow-up visits as told by your health care provider. This is important.  Do not drive or operate heavy machinery while taking prescription pain medicine. Contact a health care provider if:  Your pain, tenderness, or swelling gets worse, even if you have had treatment.  You have numbness or tingling in your wrist, hand, or fingers on the  injured side. This information is not intended to replace advice given to you by your health care provider. Make sure you discuss any questions you have with your health care provider. Document Released: 07/17/2005 Document Revised: 12/23/2015 Document Reviewed: 09/22/2014 Elsevier Interactive Patient Education  [employer].   If you have lab work done today you will be contacted with your lab results within the next 2 weeks.  If you have not heard from [place] then please contact us. The fastest way to get your results is to register for My Chart.   IF you received an x-ray today, you will receive an invoice from [facility] Radiology. Please contact [facility] Radiology at [phone] with questions or concerns regarding your invoice.   IF you received labwork today, you will receive an invoice from [facility]. Please contact [facility] at [phone] with questions or concerns regarding your invoice.   Our billing staff will not be able to assist you with questions regarding bills from these companies.  You will be contacted with the lab results as soon as they are available. The fastest way to get your results is to activate your My Chart account. Instructions are located on the last page of this paperwork. If you have not heard from [place] regarding the results in 2 weeks, please contact this office.

## 2018-05-17 NOTE — Telephone Encounter (Signed)
Gave pt avs and calendar  °

## 2018-05-17 NOTE — Assessment & Plan Note (Signed)
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 Neoadjuvant chemotherapy with TCH Perjeta 6 started 10/11/2016 completed 01/24/2017 followed by Herceptinmaintenance for 1 year 2.Breast conserving surgery on 03/05/2017 3. Followed by adjuvant radiation therapy10/22/2018-06/09/2017 4. Followed by adjuvant antiestrogen therapy with letrozole started 07/11/2017; Neratinib started 11/28/2017  ---------------------------------------------------------------------- Current treatment:letrozole and Neratinib letrozole Toxicities:Denies any major side effects to letrozole. Does not have any hot flashes or myalgias.  Neratinib toxicities: Diarrhea: Intermittently.  She came back from a trip to [place] and had a wonderful time.  Return to clinic in 3 months for follow-up  

## 2018-05-21 ENCOUNTER — Encounter: Payer: Self-pay | Admitting: Family Medicine

## 2018-05-26 ENCOUNTER — Other Ambulatory Visit: Payer: Self-pay | Admitting: Hematology and Oncology

## 2018-06-06 ENCOUNTER — Ambulatory Visit (INDEPENDENT_AMBULATORY_CARE_PROVIDER_SITE_OTHER): Payer: BLUE CROSS/BLUE SHIELD | Admitting: Orthopaedic Surgery

## 2018-06-10 ENCOUNTER — Encounter: Payer: Self-pay | Admitting: Family Medicine

## 2018-06-25 ENCOUNTER — Telehealth: Payer: Self-pay

## 2018-06-25 NOTE — Telephone Encounter (Signed)
Oral Oncology Patient Advocate Encounter  I received a fax from [facility] requesting an up to date medication and allergy list in an effort to ensure safety and efficacy of medications they fill for their patients.  I emailed this information to our direct contact, [employer]. This patient is not in my portal, I am working on getting them added.  [facility] Patient Powers Phone [phone] Fax [phone]

## 2018-07-16 ENCOUNTER — Other Ambulatory Visit: Payer: Self-pay | Admitting: Hematology and Oncology

## 2018-08-02 ENCOUNTER — Telehealth: Payer: Self-pay | Admitting: *Deleted

## 2018-08-02 NOTE — Telephone Encounter (Signed)
Received call from patient stating she has noticed for the past month that when she takes her nerylnx and letrozole 20 minutes later she sees a circle in both eyes with 4 frames and this lasts about 30 minutes and she cannot read anything for that 30 minutes.  She denies any dizziness or blurred vision.  Dr. Lindi Adie suggests she take the nerylnx and letrozole at different times to see if it makes a difference and if not she can stop nerylnx for a week to if symptoms subside.  Patient verbalized understanding.

## 2018-08-16 ENCOUNTER — Inpatient Hospital Stay: Payer: Self-pay | Attending: Hematology and Oncology | Admitting: Hematology and Oncology

## 2018-08-16 ENCOUNTER — Telehealth: Payer: Self-pay | Admitting: Hematology and Oncology

## 2018-08-16 DIAGNOSIS — Z79811 Long term (current) use of aromatase inhibitors: Secondary | ICD-10-CM

## 2018-08-16 DIAGNOSIS — C50212 Malignant neoplasm of upper-inner quadrant of left female breast: Secondary | ICD-10-CM

## 2018-08-16 DIAGNOSIS — Z79899 Other long term (current) drug therapy: Secondary | ICD-10-CM

## 2018-08-16 DIAGNOSIS — Z170 Estrogen receptor positive status [ER+]: Secondary | ICD-10-CM

## 2018-08-16 DIAGNOSIS — Z923 Personal history of irradiation: Secondary | ICD-10-CM

## 2018-08-16 DIAGNOSIS — Z9221 Personal history of antineoplastic chemotherapy: Secondary | ICD-10-CM

## 2018-08-16 MED ORDER — NERATINIB MALEATE 40 MG PO TABS: 240.0000 mg | ORAL_TABLET | Freq: Every day | ORAL | 0 refills | Status: DC

## 2018-08-16 NOTE — Progress Notes (Signed)
Patient Care Team: [name], MD as PCP - General (Family Medicine) [name], MD as Consulting Physician (General Surgery) Nicholas Lose, MD as Consulting Physician (Hematology and Oncology) Eppie Gibson, MD as Attending Physician (Radiation Oncology) Gardenia Phlegm, NP as Nurse Practitioner (Hematology and Oncology)  DIAGNOSIS:    ICD-10-CM   1. Malignant neoplasm of upper-inner quadrant of left breast in female, estrogen receptor positive ([place]) C50.212 MM DIAG BREAST TOMO BILATERAL   Z17.0     SUMMARY OF ONCOLOGIC HISTORY:   Malignant neoplasm of upper-inner quadrant of left breast in female, estrogen receptor positive ([place])   09/14/2016 Initial Diagnosis    Palpable left breast mass for 2 months, ultrasound at 9:00: 4.7 x 3.6 x 3.3 cm, axilla negative: U/S Bx: Grade 2 IDC ER 95%, PR 50%, HER-2 positive ratio 6.17, Ki-67 20%, T2 N0 stage IIA clinical stage    09/27/2016 Breast MRI    Left breast: 3.5 cm irregular necrotic mass; left axillary lymph node 1.2 cm; right breast 0.6 cm mass at 6:00 position 0.5 cm mass right breast lower inner quadrant     10/11/2016 - 01/24/2017 Neo-Adjuvant Chemotherapy    Taxotere, Carboplatin, Herceptin, Perjeta x 6 cycles followed by Herceptin and Perjeta maintenance for one year    01/26/2017 Breast MRI    Left breast: Irregular enhancing mass measuring 2.7 cm, smaller than previous, no abnormal lymph nodes; right breast: No mass or enhancement     03/05/2017 Surgery    Left lumpectomy: IDC grade 2, 2.5 cm, margins negative, 0/2 lymph nodes negative, ER 100%, PR 50%, HER-2 positive ratio 4.88, T2 N0 stage IB AJCC 8     04/17/2017 Mammogram    2 groups of suspicious residual calcifications in the left breast spanning 4.8 cm one is anterior to lumpectomy the other is medial aspect of lumpectomy    05/21/2017 - 06/15/2017 Radiation Therapy    Adjuvant radiation therapy    07/11/2017 -  Anti-estrogen oral therapy   Letrozole 2.5 mg daily; Neratinib started 11/28/2017     CHIEF COMPLIANT: Follow-up on letrozole and Neratinib  INTERVAL HISTORY: Miranda Woods is a 63 y.o. with above-mentioned history of left breast cancer treated with neoadjuvant chemotherapy followed by lumpectomy and radiation and is currently on letrozole along with Neratinib. She presents to the clinic alone today. She reports occasional diarrhea that is tolerable and for which she takes Immodium. She reports at the beginning of December she began seeing the shape of a circle with four circles that flashes in front over her eyes that doesn't happen every day and goes away. It is very difficult to focus on a computer and when she is driving, but it is not blurry vision. She reports short term memory loss. She is following up with her opthalmologist soon. She notes it could be anxiety. She denies taking any other medications or drugs. Taking a small dose of Ritalin did not help with focusing. She notes her symptoms worsen when traveling, especially more diarrhea.   REVIEW OF SYSTEMS:   Constitutional: Denies fevers, chills or abnormal weight loss Eyes: (+) difficulty focusing (+) seeing shapes Ears, nose, mouth, throat, and face: Denies mucositis or sore throat Respiratory: Denies cough, dyspnea or wheezes Cardiovascular: Denies palpitation, chest discomfort Gastrointestinal:  Denies nausea, heartburn (+) occasional diarrhea Skin: Denies abnormal skin rashes Lymphatics: Denies new lymphadenopathy or easy bruising Neurological: Denies numbness, tingling or new weaknesses Behavioral/Psych: (+) anxiety (+) short term memory loss Extremities: No lower extremity  edema Breast: denies any pain or lumps or nodules in either breasts All other systems were reviewed with the patient and are negative.  I have reviewed the past medical history, past surgical history, social history and family history with the patient and they are unchanged from  previous note.  ALLERGIES:  is allergic to codeine.  MEDICATIONS:  Current Outpatient Medications  Medication Sig Dispense Refill  . Biotin 5 MG CAPS Take 1 capsule (5 mg total) by mouth daily.  0  . Calcium-Magnesium (CALCIUM MAGNESIUM 750) 300-300 MG TABS Take 1 tablet by mouth daily.  0  . letrozole (FEMARA) 2.5 MG tablet TAKE 1 TABLET(2.5 MG) BY MOUTH DAILY 90 tablet 3  . Melatonin 5 MG TABS Take 1 tablet (5 mg total) by mouth at bedtime as needed. 90 tablet 3  . Neratinib Maleate (NERLYNX) 40 MG tablet Take 6 tablets (240 mg total) by mouth daily. Take with food. 180 tablet 0  . UNABLE TO FIND Med Name: Tonic Alcemy. One scoop every morning    . UNABLE TO FIND daily. Med Name: protandium herbal supplement    . UNABLE TO FIND daily. Med Name: tumeric     No current facility-administered medications for this visit.    Facility-Administered Medications Ordered in Other Visits  Medication Dose Route Frequency Provider Last Rate Last Dose  . sodium chloride flush (NS) 0.9 % injection 10 mL  10 mL Intravenous PRN Nicholas Lose, MD   10 mL at 01/24/17 1116  . sodium chloride flush (NS) 0.9 % injection 10 mL  10 mL Intravenous PRN Nicholas Lose, MD   10 mL at 02/14/17 1236    PHYSICAL EXAMINATION: ECOG PERFORMANCE STATUS: 1 - Symptomatic but completely ambulatory  Vitals:   08/16/18 1012  BP: 127/78  Pulse: 61  Resp: 18  SpO2: 100%   Filed Weights   08/16/18 1012  Weight: 125 lb 9.6 oz (57 kg)    GENERAL: alert, no distress and comfortable SKIN: skin color, texture, turgor are normal, no rashes or significant lesions EYES: normal, Conjunctiva are pink and non-injected, sclera clear OROPHARYNX: no exudate, no erythema and lips, buccal mucosa, and tongue normal  NECK: supple, thyroid normal size, non-tender, without nodularity LYMPH: no palpable lymphadenopathy in the cervical, axillary or inguinal LUNGS: clear to auscultation and percussion with normal breathing  effort HEART: regular rate & rhythm and no murmurs and no lower extremity edema ABDOMEN: abdomen soft, non-tender and normal bowel sounds MUSCULOSKELETAL: no cyanosis of digits and no clubbing  NEURO: alert & oriented x 3 with fluent speech, no focal motor/sensory deficits EXTREMITIES: No lower extremity edema  LABORATORY DATA:  I have reviewed the data as listed CMP Latest Ref Rng & Units 05/17/2018 02/08/2018 12/12/2017  Glucose 70 - 99 mg/dL 106(H) 70 84  BUN 6 - 20 mg/dL _0 Creatinine 0.44 - 1.00 mg/dL 0.73 0.72 0.64  Sodium 135 - 145 mmol/L 139 141 139  Potassium 3.5 - 5.1 mmol/L 4.1 4.0 3.9  Chloride 98 - 111 mmol/L 104 104 105  CO2 22 - 32 mmol/L _1 Calcium 8.9 - 10.3 mg/dL 9.6 9.5 9.4  Total Protein 6.5 - 8.1 g/dL 7.3 7.3 7.0  Total Bilirubin 0.3 - 1.2 mg/dL 0.5 0.5 0.4  Alkaline Phos 38 - 126 U/L 75 79 64  AST 15 - 41 U/L _2 ALT 0 - 44 U/L _3 Lab Results  Component Value Date  WBC 3.4 (L) 05/17/2018   HGB 13.2 05/17/2018   HCT 39.1 05/17/2018   MCV 93.5 05/17/2018   PLT 241 05/17/2018   NEUTROABS 2.2 05/17/2018    ASSESSMENT & PLAN:  Malignant neoplasm of upper-inner quadrant of left breast in female, estrogen receptor positive ([place]) Palpable left breast mass for 2 months, ultrasound at 9:00: 4.7 x 3.6 x 3.3 cm, axilla negative: U/S Bx: Grade 2 IDC ER 95%, PR 50%, HER-2 positive ratio 6.17, Ki-67 20%,  Right breast biopsy: PASH  03/05/2017 Left lumpectomy: IDC grade 2, 2.5 cm, margins negative, 0/2 lymph nodes negative, ER 100%, PR 50%, HER-2 positive ratio 4.88, T2 N0 stage IB AJCC 8  Treatment summary 1. Neoadjuvant chemotherapy with TCH Perjeta 6 started 10/11/2016 completed 01/24/2017 followed by Herceptinmaintenance for 1 year 2.Breast conserving surgery on 03/05/2017 3. Followed by adjuvant radiation therapy10/22/2018-06/09/2017 4. Followed by adjuvant antiestrogen therapy with letrozole started 07/11/2017; Neratinib  started 11/28/2017  ---------------------------------------------------------------------- Current treatment:letrozole and Neratinib letrozole Toxicities:Denies any major side effects to letrozole. Does not have any hot flashes or myalgias. Vision issues: Patient going to see an eye doctor. I suspect patient has anxiety which has caused her problems with lightheadedness/dizziness in stressful environments. Patient's dog has been diagnosed with myasthenia gravis.  Neratinib toxicities: Diarrhea: Intermittently.    Return to clinic in 5 months for follow-up    Orders Placed This Encounter  Procedures  . MM DIAG BREAST TOMO BILATERAL    Standing Status:   Future    Standing Expiration Date:   08/17/2019    Order Specific Question:   Reason for Exam (SYMPTOM  OR DIAGNOSIS REQUIRED)    Answer:   Annual mammograms with History of breast cancer    Order Specific Question:   Is the patient pregnant?    Answer:   Yes    Order Specific Question:   Preferred imaging location?    Answer:   [facility]   The patient has a good understanding of the overall plan. she agrees with it. she will call with any problems that may develop before the next visit here.  Nicholas Lose, MD 08/16/2018  Julious Oka Dorshimer am acting as scribe for Dr. Nicholas Lose.  I have reviewed the above documentation for accuracy and completeness, and I agree with the above.

## 2018-08-16 NOTE — Assessment & Plan Note (Signed)
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 Neoadjuvant chemotherapy with TCH Perjeta 6 started 10/11/2016 completed 01/24/2017 followed by Herceptinmaintenance for 1 year 2.Breast conserving surgery on 03/05/2017 3. Followed by adjuvant radiation therapy10/22/2018-06/09/2017 4. Followed by adjuvant antiestrogen therapy with letrozole started 07/11/2017; Neratinib started 11/28/2017  ---------------------------------------------------------------------- Current treatment:letrozole and Neratinib letrozole Toxicities:Denies any major side effects to letrozole. Does not have any hot flashes or myalgias.  Neratinib toxicities: Diarrhea: Intermittently.    Return to clinic in 3 months for follow-up

## 2018-08-16 NOTE — Telephone Encounter (Signed)
Gave avs and calendar ° °

## 2018-09-05 ENCOUNTER — Telehealth: Payer: Self-pay

## 2018-09-05 NOTE — Telephone Encounter (Signed)
Oral Oncology Patient Advocate Encounter  Received notification from [place] from [facility] [employer] that the existing prior authorization for Nerlynx is due for renewal.  Renewal PA submitted on CoverMyMeds Key [id number] Status is pending  Oral Oncology Clinic will continue to follow.  [place] Patient [place] Phone [phone] Fax [phone]

## 2018-09-06 NOTE — Telephone Encounter (Signed)
Oral Oncology Pharmacist Encounter  Received notification of insurance authorization approval from [employer] prescription insurance  Nerlynx 40 mg tablets, quantity #180 tablets per 30 days has been approved Tracking ID: [id number] Effective dates: 09/05/2018- 09/08/2019  I called [place] at [phone] to report insurance authorization approval Representative stated that prior authorization department does not arrive until 9 AM  Copy of prior authorization approval has been faxed to [facility] PA department at [place], PharmD, BCPS, BCOP  09/06/2018 8:11 AM [facility] [phone]

## 2018-09-25 ENCOUNTER — Ambulatory Visit
Admission: RE | Admit: 2018-09-25 | Discharge: 2018-09-25 | Disposition: A | Discharge status: Home / Self Care | Payer: PRIVATE HEALTH INSURANCE | Source: Ambulatory Visit | Attending: Hematology and Oncology | Admitting: Hematology and Oncology

## 2018-09-25 DIAGNOSIS — C50212 Malignant neoplasm of upper-inner quadrant of left female breast: Secondary | ICD-10-CM

## 2018-09-25 DIAGNOSIS — Z170 Estrogen receptor positive status [ER+]: Principal | ICD-10-CM

## 2018-10-14 ENCOUNTER — Ambulatory Visit: Payer: BLUE CROSS/BLUE SHIELD | Admitting: Family Medicine

## 2018-10-30 DIAGNOSIS — C50912 Malignant neoplasm of unspecified site of left female breast: Secondary | ICD-10-CM

## 2018-10-30 HISTORY — DX: Malignant neoplasm of unspecified site of left female breast: C50.912

## 2018-11-20 ENCOUNTER — Telehealth: Payer: Self-pay | Admitting: *Deleted

## 2018-11-20 ENCOUNTER — Other Ambulatory Visit: Payer: Self-pay | Admitting: Hematology and Oncology

## 2018-11-20 ENCOUNTER — Other Ambulatory Visit: Payer: Self-pay | Admitting: *Deleted

## 2018-11-20 DIAGNOSIS — C50212 Malignant neoplasm of upper-inner quadrant of left female breast: Secondary | ICD-10-CM

## 2018-11-20 DIAGNOSIS — Z170 Estrogen receptor positive status [ER+]: Principal | ICD-10-CM

## 2018-11-20 MED ORDER — LORAZEPAM 1 MG PO TABS: 1.0000 mg | ORAL_TABLET | Freq: Once | ORAL | 0 refills | Status: AC

## 2018-11-20 NOTE — Telephone Encounter (Signed)
Attempted to call patient. Voicemail is full.

## 2018-11-20 NOTE — Progress Notes (Signed)
Received call from pt stating she is still experiencing issues with her vision.  She is seeing the shape of a circle with four circles that flashes in front over her eyes.  The pt also states that her vision has been extremely blurry to the point where it is interfering with her work.  Pt did visit her optomolagist who stated that her eyes and eye tissue looked normal.  Her vision has slightly changed from last year but nothing abnormal.  Per Dr. Lindi Adie, order pt an MRI W WO contrast and based on those results we will figure out the next steps.  Pt is requesting something for anxiety prior to the MRI for claustrophobia. Ativan 1mg  will be sent to pt pharmacy to take 1 hour prior to scan.  Pt verbalized understanding.

## 2018-11-27 ENCOUNTER — Ambulatory Visit (HOSPITAL_COMMUNITY)
Admission: RE | Admit: 2018-11-27 | Discharge: 2018-11-27 | Disposition: A | Discharge status: Home / Self Care | Payer: PRIVATE HEALTH INSURANCE | Source: Ambulatory Visit | Attending: Hematology and Oncology | Admitting: Hematology and Oncology

## 2018-11-27 ENCOUNTER — Other Ambulatory Visit: Payer: Self-pay

## 2018-11-27 ENCOUNTER — Encounter: Payer: Self-pay | Admitting: General Practice

## 2018-11-27 ENCOUNTER — Inpatient Hospital Stay: Payer: PRIVATE HEALTH INSURANCE | Attending: Hematology and Oncology | Admitting: Hematology and Oncology

## 2018-11-27 ENCOUNTER — Inpatient Hospital Stay
Admission: RE | Admit: 2018-11-27 | Discharge: 2018-11-27 | Disposition: A | Discharge status: Home / Self Care | Payer: Self-pay | Source: Ambulatory Visit | Admitting: Radiation Oncology

## 2018-11-27 VITALS — BP 127/85 | HR 76 | Temp 97.6°F | Resp 18 | Ht 65.0 in | Wt 125.5 lb

## 2018-11-27 DIAGNOSIS — Z885 Allergy status to narcotic agent status: Secondary | ICD-10-CM | POA: Diagnosis not present

## 2018-11-27 DIAGNOSIS — Z7189 Other specified counseling: Secondary | ICD-10-CM | POA: Insufficient documentation

## 2018-11-27 DIAGNOSIS — Z170 Estrogen receptor positive status [ER+]: Secondary | ICD-10-CM

## 2018-11-27 DIAGNOSIS — Z79899 Other long term (current) drug therapy: Secondary | ICD-10-CM | POA: Insufficient documentation

## 2018-11-27 DIAGNOSIS — C50212 Malignant neoplasm of upper-inner quadrant of left female breast: Secondary | ICD-10-CM | POA: Insufficient documentation

## 2018-11-27 DIAGNOSIS — C7931 Secondary malignant neoplasm of brain: Secondary | ICD-10-CM | POA: Insufficient documentation

## 2018-11-27 DIAGNOSIS — Z79811 Long term (current) use of aromatase inhibitors: Secondary | ICD-10-CM | POA: Insufficient documentation

## 2018-11-27 LAB — POCT I-STAT CREATININE: Creatinine, Ser: 0.5 mg/dL (ref 0.44–1.00)

## 2018-11-27 MED ORDER — SODIUM CHLORIDE 0.9 % IV SOLN
20.0000 mg | Freq: Once | INTRAVENOUS | Status: AC
  Administered 2018-11-27: 20 mg via INTRAVENOUS
  Filled 2018-11-27: qty 2

## 2018-11-27 MED ORDER — DEXAMETHASONE 4 MG PO TABS: 4.0000 mg | ORAL_TABLET | Freq: Two times a day (BID) | ORAL | 0 refills | Status: DC

## 2018-11-27 MED ORDER — GADOBUTROL 1 MMOL/ML IV SOLN
5.0000 mL | Freq: Once | INTRAVENOUS | Status: AC | PRN
  Administered 2018-11-27: 5 mL via INTRAVENOUS

## 2018-11-27 MED ORDER — LIDOCAINE-PRILOCAINE 2.5-2.5 % EX CREA: TOPICAL_CREAM | CUTANEOUS | 3 refills | Status: AC

## 2018-11-27 NOTE — Progress Notes (Signed)
[facility] CSW Progress Notes  At request of Jolene Schimke, RN, met w patient in exam room to offer support and provide information about financial assistance options.  Discussed ways to initiate application for Social Security Disability income - sent referral to [facility] for assistance.  Provided my contact information and encouraged patient to reach out for help/support.  Edwyna Shell, LCSW Clinical Social Worker Phone:  [phone]

## 2018-11-27 NOTE — Progress Notes (Signed)
Patient Care Team: [name], MD as PCP - General (Family Medicine) [name], MD as Consulting Physician (General Surgery) Nicholas Lose, MD as Consulting Physician (Hematology and Oncology) Eppie Gibson, MD as Attending Physician (Radiation Oncology) Gardenia Phlegm, NP as Nurse Practitioner (Hematology and Oncology)  DIAGNOSIS:  Encounter Diagnoses  Name Primary?  . Malignant neoplasm of upper-inner quadrant of left breast in female, estrogen receptor positive ([place]) Yes  . Brain metastases ([place])     SUMMARY OF ONCOLOGIC HISTORY:   Malignant neoplasm of upper-inner quadrant of left breast in female, estrogen receptor positive ([place])   09/14/2016 Initial Diagnosis    Palpable left breast mass for 2 months, ultrasound at 9:00: 4.7 x 3.6 x 3.3 cm, axilla negative: U/S Bx: Grade 2 IDC ER 95%, PR 50%, HER-2 positive ratio 6.17, Ki-67 20%, T2 N0 stage IIA clinical stage    09/27/2016 Breast MRI    Left breast: 3.5 cm irregular necrotic mass; left axillary lymph node 1.2 cm; right breast 0.6 cm mass at 6:00 position 0.5 cm mass right breast lower inner quadrant     10/11/2016 - 01/24/2017 Neo-Adjuvant Chemotherapy    Taxotere, Carboplatin, Herceptin, Perjeta x 6 cycles followed by Herceptin and Perjeta maintenance for one year    01/26/2017 Breast MRI    Left breast: Irregular enhancing mass measuring 2.7 cm, smaller than previous, no abnormal lymph nodes; right breast: No mass or enhancement     03/05/2017 Surgery    Left lumpectomy: IDC grade 2, 2.5 cm, margins negative, 0/2 lymph nodes negative, ER 100%, PR 50%, HER-2 positive ratio 4.88, T2 N0 stage IB AJCC 8     04/17/2017 Mammogram    2 groups of suspicious residual calcifications in the left breast spanning 4.8 cm one is anterior to lumpectomy the other is medial aspect of lumpectomy    05/21/2017 - 06/15/2017 Radiation Therapy    Adjuvant radiation therapy    07/11/2017 -  Anti-estrogen oral therapy   Letrozole 2.5 mg daily; Neratinib started 11/28/2017     CHIEF COMPLIANT: New onset development of brain metastases  INTERVAL HISTORY: Miranda Woods is a 63 year old with above-mentioned history of HER-2 positive breast cancer who has been in remission since 2018 who presented today from the MRI which showed extensive brain metastases.  Since January she has been experiencing multiple neurological symptoms including visual problems as well as the difficulties with cognition and ability to read.  It has gotten so worse to the point that she has been unable to read any material.  Her work is also been significantly curtailed because of this limitation.  She had even trouble with driving and therefore she came in with her brother.  REVIEW OF SYSTEMS:   Constitutional: Denies fevers, chills or abnormal weight loss Eyes: Loss of peripheral vision Ears, nose, mouth, throat, and face: Denies mucositis or sore throat Respiratory: Denies cough, dyspnea or wheezes Cardiovascular: Denies palpitation, chest discomfort Gastrointestinal:  Denies nausea, heartburn or change in bowel habits Skin: Denies abnormal skin rashes Lymphatics: Denies new lymphadenopathy or easy bruising Neurological: Neurological problems including vision issues. Behavioral/Psych: Depressed mood Extremities: No lower extremity edema   All other systems were reviewed with the patient and are negative.  I have reviewed the past medical history, past surgical history, social history and family history with the patient and they are unchanged from previous note.  ALLERGIES:  is allergic to codeine.  MEDICATIONS:  Current Outpatient Medications  Medication Sig Dispense Refill  .  Biotin 5 MG CAPS Take 1 capsule (5 mg total) by mouth daily.  0  . Calcium-Magnesium (CALCIUM MAGNESIUM 750) 300-300 MG TABS Take 1 tablet by mouth daily.  0  . dexamethasone (DECADRON) 4 MG tablet Take 1 tablet (4 mg total) by mouth 2 (two) times  daily. 180 tablet 0  . letrozole (FEMARA) 2.5 MG tablet TAKE 1 TABLET(2.5 MG) BY MOUTH DAILY 90 tablet 3  . Melatonin 5 MG TABS Take 1 tablet (5 mg total) by mouth at bedtime as needed. 90 tablet 3  . Neratinib Maleate (NERLYNX) 40 MG tablet Take 6 tablets (240 mg total) by mouth daily. Take with food. 180 tablet 0  . UNABLE TO FIND Med Name: Tonic Alcemy. One scoop every morning    . UNABLE TO FIND daily. Med Name: protandium herbal supplement    . UNABLE TO FIND daily. Med Name: tumeric     Current Facility-Administered Medications  Medication Dose Route Frequency Provider Last Rate Last Dose  . dexamethasone (DECADRON) 20 mg in sodium chloride 0.9 % 50 mL IVPB  20 mg Intravenous Once Nicholas Lose, MD 208 mL/hr at 11/27/18 1237 20 mg at 11/27/18 1237   Facility-Administered Medications Ordered in Other Visits  Medication Dose Route Frequency Provider Last Rate Last Dose  . sodium chloride flush (NS) 0.9 % injection 10 mL  10 mL Intravenous PRN Nicholas Lose, MD   10 mL at 01/24/17 1116  . sodium chloride flush (NS) 0.9 % injection 10 mL  10 mL Intravenous PRN Nicholas Lose, MD   10 mL at 02/14/17 1236    PHYSICAL EXAMINATION: ECOG PERFORMANCE STATUS: 2 - Symptomatic, <50% confined to bed  Vitals:   11/27/18 1124  BP: 127/85  Pulse: 76  Resp: 18  Temp: 97.6 F (36.4 C)  SpO2: 100%   Filed Weights   11/27/18 1124  Weight: 125 lb 8.6 oz (56.9 kg)    GENERAL:alert, no distress and comfortable SKIN: skin color, texture, turgor are normal, no rashes or significant lesions EYES: normal, Conjunctiva are pink and non-injected, sclera clear OROPHARYNX:no exudate, no erythema and lips, buccal mucosa, and tongue normal  NECK: supple, thyroid normal size, non-tender, without nodularity LYMPH:  no palpable lymphadenopathy in the cervical, axillary or inguinal LUNGS: clear to auscultation and percussion with normal breathing effort HEART: regular rate & rhythm and no murmurs and no  lower extremity edema ABDOMEN:abdomen soft, non-tender and normal bowel sounds MUSCULOSKELETAL:no cyanosis of digits and no clubbing  NEURO: alert & oriented x 3 with fluent speech, no motor or sensory deficits.  Speech is normal.  Peripheral vision is markedly impaired.  Inability to read  EXTREMITIES: No lower extremity edema   LABORATORY DATA:  I have reviewed the data as listed CMP Latest Ref Rng & Units 11/27/2018 05/17/2018 02/08/2018  Glucose 70 - 99 mg/dL - 106(H) 70  BUN 6 - 20 mg/dL - 11 12  Creatinine 0.44 - 1.00 mg/dL 0.50 0.73 0.72  Sodium 135 - 145 mmol/L - 139 141  Potassium 3.5 - 5.1 mmol/L - 4.1 4.0  Chloride 98 - 111 mmol/L - 104 104  CO2 22 - 32 mmol/L - 26 28  Calcium 8.9 - 10.3 mg/dL - 9.6 9.5  Total Protein 6.5 - 8.1 g/dL - 7.3 7.3  Total Bilirubin 0.3 - 1.2 mg/dL - 0.5 0.5  Alkaline Phos 38 - 126 U/L - 75 79  AST 15 - 41 U/L - 16 17  ALT 0 - 44 U/L - 11  15    Lab Results  Component Value Date   WBC 3.4 (L) 05/17/2018   HGB 13.2 05/17/2018   HCT 39.1 05/17/2018   MCV 93.5 05/17/2018   PLT 241 05/17/2018   NEUTROABS 2.2 05/17/2018    ASSESSMENT & PLAN:  Brain metastases ([place]) Extensive and widespread brain metastases: Patient has extensive visual symptoms are started off fairly mild in January 2020.  She started losing peripheral vision and later ability to read.  She was also having poor thinking and thought process.  She has no motor deficits.  Denies any sensory deficits.  Brain MRI 11/27/2018: Extensive widespread brain metastases.  Largest tumor in the left occipital lobe 4.5 cm.  Several other large tumors noted in multiple areas.  Extensive smaller tumors are noted including the cerebellum.  Plan: 1.  IV dexamethasone 20 mg x 1 and 4 mg p.o. twice daily 2.  Consult radiation oncology for palliative whole brain radiation 3.  Consult neuro oncology with Dr. Mickeal Skinner.  I discussed with him and he will evaluate.  He did not think she needs seizure  prophylaxis at this time. 4.  PET CT scan 5.  Xeloda with Tucatinib and Herceptin once radiation is complete 6.  We will request port placement  Tucatinib trastuzumab and Xeloda  Tucatinib is a tyrosine kinase inhibitor that targets HER-2 and her 3 receptors. The major toxicities of this combination are hand-foot syndrome 63% and skin rashes, diarrhea 81%, hepatotoxicity 42%, renal and liver function abnormalities, nausea vomiting and anemia.  The initial dose is 300 mg p.o. twice daily.  The efficacy was studied in [facility] trial enrolled 612 patients with HER2-positive metastatic breast cancer. median PFS in patients receiving tucatinib was 7.8 months (95% CI: 7.5, 9.6) compared to 5.6 months (95% CI: 4.2, 7.1) for patients enrolled on the control arm (HR 0.54; 95% CI: 0.42, 0.71; p<0.00001). median OS in patients on the tucatinib arm was 21.9 months (95% CI: 18.3, 31.0) compared to 17.4 months (95% CI: 13.6, 19.9) for patients on the control arm (HR: 0.66; 95% CI: 0.50, 0.87; p=[id number]). The median PFS for patients with baseline brain metastases on the tucatinib arm was 7.6 months (95% CI: 6.2, 9.5) compared to 5.4 months (95% CI: 4.1, 5.7) for those on the control arm (HR: 0.48; 0.34, 0.69; p<0.00001).  Prognosis: I discussed with her that untreated she has extremely poor prognosis.  With treatment, she could be alive for several years.  She has excellent performance status.   I will see her towards end of radiation to start her on systemic therapy.    Orders Placed This Encounter  Procedures  . NM PET Image Initial (PI) Skull Base To Thigh    Standing Status:   Future    Standing Expiration Date:   01/27/2020    Order Specific Question:   ** REASON FOR EXAM (FREE TEXT)    Answer:   Metastatic breast cancer with brain metastases    Order Specific Question:   If indicated for the ordered procedure, I authorize the administration of a radiopharmaceutical per Radiology protocol    Answer:    Yes    Order Specific Question:   Is the patient pregnant?    Answer:   No    Order Specific Question:   Preferred imaging location?    Answer:   [facility]    Order Specific Question:   Radiology Contrast Protocol - do NOT remove file path    Answer:   [email or web address]  The patient has a good understanding of the overall plan. she agrees with it. she will call with any problems that may develop before the next visit here.   Harriette Ohara, MD 11/27/18

## 2018-11-27 NOTE — Progress Notes (Signed)
Pt instructed to go to Cancer center after MRI. Spoke with Dr Carlis Abbott.

## 2018-11-27 NOTE — Assessment & Plan Note (Addendum)
Extensive and widespread brain metastases: Patient has extensive visual symptoms are started off fairly mild in January 2020.  She started losing peripheral vision and later ability to read.  She was also having poor thinking and thought process.  She has no motor deficits.  Denies any sensory deficits.  Brain MRI 11/27/2018: Extensive widespread brain metastases.  Largest tumor in the left occipital lobe 4.5 cm.  Several other large tumors noted in multiple areas.  Extensive smaller tumors are noted including the cerebellum.  Plan: 1.  IV dexamethasone 20 mg x 1 and 4 mg p.o. twice daily 2.  Consult radiation oncology for palliative whole brain radiation 3.  Consult neuro oncology with Dr. Mickeal Skinner.  I discussed with him and he will evaluate.  He did not think she needs seizure prophylaxis at this time. 4.  PET CT scan 5.  Xeloda with Tucatinib and Herceptin once radiation is complete 6.  We will request port placement  Tucatinib trastuzumab and Xeloda  Tucatinib is a tyrosine kinase inhibitor that targets HER-2 and her 3 receptors. The major toxicities of this combination are hand-foot syndrome 63% and skin rashes, diarrhea 81%, hepatotoxicity 42%, renal and liver function abnormalities, nausea vomiting and anemia.  The initial dose is 300 mg p.o. twice daily.  The efficacy was studied in [facility] trial enrolled 612 patients with HER2-positive metastatic breast cancer. median PFS in patients receiving tucatinib was 7.8 months (95% CI: 7.5, 9.6) compared to 5.6 months (95% CI: 4.2, 7.1) for patients enrolled on the control arm (HR 0.54; 95% CI: 0.42, 0.71; p<0.00001). median OS in patients on the tucatinib arm was 21.9 months (95% CI: 18.3, 31.0) compared to 17.4 months (95% CI: 13.6, 19.9) for patients on the control arm (HR: 0.66; 95% CI: 0.50, 0.87; p=[phone]). The median PFS for patients with baseline brain metastases on the tucatinib arm was 7.6 months (95% CI: 6.2, 9.5) compared to 5.4 months  (95% CI: 4.1, 5.7) for those on the control arm (HR: 0.48; 0.34, 0.69; p<0.00001).  Prognosis: I discussed with her that untreated she has extremely poor prognosis.  With treatment, she could be alive for several years.  She has excellent performance status.   I will see her towards end of radiation to start her on systemic therapy.

## 2018-11-28 ENCOUNTER — Other Ambulatory Visit: Payer: Self-pay | Admitting: *Deleted

## 2018-11-28 ENCOUNTER — Telehealth: Payer: Self-pay

## 2018-11-28 ENCOUNTER — Telehealth: Payer: Self-pay | Admitting: Hematology and Oncology

## 2018-11-28 ENCOUNTER — Other Ambulatory Visit: Payer: Self-pay | Admitting: Hematology and Oncology

## 2018-11-28 ENCOUNTER — Other Ambulatory Visit: Payer: Self-pay | Admitting: General Surgery

## 2018-11-28 ENCOUNTER — Telehealth: Payer: Self-pay | Admitting: *Deleted

## 2018-11-28 ENCOUNTER — Telehealth: Payer: Self-pay | Admitting: Pharmacist

## 2018-11-28 DIAGNOSIS — C50212 Malignant neoplasm of upper-inner quadrant of left female breast: Secondary | ICD-10-CM

## 2018-11-28 DIAGNOSIS — Z170 Estrogen receptor positive status [ER+]: Principal | ICD-10-CM

## 2018-11-28 MED ORDER — CAPECITABINE 500 MG PO TABS: 625.0000 mg/m2 | ORAL_TABLET | Freq: Two times a day (BID) | ORAL | 6 refills | Status: DC

## 2018-11-28 MED ORDER — NONFORMULARY OR COMPOUNDED ITEM: 4 refills | Status: DC

## 2018-11-28 NOTE — Telephone Encounter (Signed)
Tried to reach °

## 2018-11-28 NOTE — Telephone Encounter (Signed)
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  Patient is planned to receive whole brain irradiation and then initiation tucatinib, capecitabine, and trastuzumab, after completion of radiation treatments. Plan for mid-May CT simulation appointment scheduled for tomorrow, 11/29/18. Radiation schedule and length of therapy will be known after that appointment.  Laury Axon is planned to be administered at 373m by mouth 2 times daily, without regard to food, continuously  Xeloda is planned to be administered at ~625 mg/m2 (10071m by mouth 2 times daily, immediately after food, taken for 14 days on, 7 days off, repeated every 21 days  Herceptin will be administered at standard dosing IV every 3 weeks   Last labs from 05/17/2018 assessed, OK for treatment at that time. Labs will be repeated tomorrow. Patient is also scheduled for ECHO on 11/29/18 to assess LVEF prior to restarting Herceptin.  Current medication list in Epic reviewed, no DDIs with Tukysa or Xeloda identified.  Prescriptions for TuLaury Axonnd [facility].  Oral Oncology Clinic will continue to follow for insurance authorization, copayment issues, initial counseling and start date.  JeJohny DrillingPharmD, BCPS, BCOP  11/28/2018 9:12 AM Oral Oncology Clinic [phone]

## 2018-11-28 NOTE — Telephone Encounter (Signed)
Called and spoke with pt letting her know that she is scheduled to have an echo cardiogram done tomorrow at 10 am at [facility].  Also informed pt that I messaged Dr. Donne Hazel and his staff to schedule her to have her port place and they will be in contact with her.  I also informed the pt that Dr. Lindi Adie wants her to stop taking her letrozole and that she can take tylenol PM as needed at night to help her sleep.  Pt verbalized understanding.

## 2018-11-28 NOTE — Telephone Encounter (Signed)
Oral Oncology Patient Advocate Encounter  Received notification from [place] that prior authorization for Xeloda and Laury Axon is required.  PA submitted on CoverMyMeds Xeloda [place] [id number] Status is pending  Oral Oncology Clinic will continue to follow.  [place] Patient [place] Phone [phone] Fax [phone] 11/28/2018    2:59 PM

## 2018-11-29 ENCOUNTER — Telehealth: Payer: Self-pay | Admitting: *Deleted

## 2018-11-29 ENCOUNTER — Telehealth: Payer: Self-pay

## 2018-11-29 ENCOUNTER — Emergency Department (HOSPITAL_COMMUNITY)
Admission: EM | Admit: 2018-11-29 | Discharge: 2018-11-30 | Disposition: A | Discharge status: Home / Self Care | Payer: PRIVATE HEALTH INSURANCE | Attending: Emergency Medicine | Admitting: Emergency Medicine

## 2018-11-29 ENCOUNTER — Encounter (HOSPITAL_BASED_OUTPATIENT_CLINIC_OR_DEPARTMENT_OTHER): Payer: Self-pay | Admitting: *Deleted

## 2018-11-29 ENCOUNTER — Other Ambulatory Visit: Payer: Self-pay

## 2018-11-29 ENCOUNTER — Emergency Department (HOSPITAL_COMMUNITY): Payer: PRIVATE HEALTH INSURANCE

## 2018-11-29 ENCOUNTER — Ambulatory Visit (HOSPITAL_BASED_OUTPATIENT_CLINIC_OR_DEPARTMENT_OTHER)
Admission: RE | Admit: 2018-11-29 | Discharge: 2018-11-29 | Disposition: A | Discharge status: Home / Self Care | Payer: PRIVATE HEALTH INSURANCE | Source: Ambulatory Visit | Attending: Hematology and Oncology | Admitting: Hematology and Oncology

## 2018-11-29 ENCOUNTER — Ambulatory Visit
Admission: RE | Admit: 2018-11-29 | Discharge: 2018-11-29 | Disposition: A | Discharge status: Home / Self Care | Payer: PRIVATE HEALTH INSURANCE | Source: Ambulatory Visit | Attending: Radiation Oncology | Admitting: Radiation Oncology

## 2018-11-29 ENCOUNTER — Inpatient Hospital Stay: Payer: PRIVATE HEALTH INSURANCE

## 2018-11-29 DIAGNOSIS — Z170 Estrogen receptor positive status [ER+]: Secondary | ICD-10-CM | POA: Diagnosis not present

## 2018-11-29 DIAGNOSIS — C50919 Malignant neoplasm of unspecified site of unspecified female breast: Secondary | ICD-10-CM | POA: Diagnosis not present

## 2018-11-29 DIAGNOSIS — Z79899 Other long term (current) drug therapy: Secondary | ICD-10-CM | POA: Insufficient documentation

## 2018-11-29 DIAGNOSIS — R1012 Left upper quadrant pain: Secondary | ICD-10-CM | POA: Insufficient documentation

## 2018-11-29 DIAGNOSIS — C50212 Malignant neoplasm of upper-inner quadrant of left female breast: Secondary | ICD-10-CM | POA: Insufficient documentation

## 2018-11-29 DIAGNOSIS — C7951 Secondary malignant neoplasm of bone: Secondary | ICD-10-CM | POA: Diagnosis not present

## 2018-11-29 DIAGNOSIS — C7931 Secondary malignant neoplasm of brain: Secondary | ICD-10-CM | POA: Insufficient documentation

## 2018-11-29 DIAGNOSIS — Z923 Personal history of irradiation: Secondary | ICD-10-CM | POA: Insufficient documentation

## 2018-11-29 DIAGNOSIS — R112 Nausea with vomiting, unspecified: Secondary | ICD-10-CM | POA: Diagnosis not present

## 2018-11-29 DIAGNOSIS — Z510 Encounter for antineoplastic radiation therapy: Secondary | ICD-10-CM | POA: Insufficient documentation

## 2018-11-29 DIAGNOSIS — Z5112 Encounter for antineoplastic immunotherapy: Secondary | ICD-10-CM | POA: Insufficient documentation

## 2018-11-29 DIAGNOSIS — Z87891 Personal history of nicotine dependence: Secondary | ICD-10-CM | POA: Insufficient documentation

## 2018-11-29 DIAGNOSIS — R109 Unspecified abdominal pain: Secondary | ICD-10-CM

## 2018-11-29 DIAGNOSIS — C787 Secondary malignant neoplasm of liver and intrahepatic bile duct: Secondary | ICD-10-CM | POA: Insufficient documentation

## 2018-11-29 DIAGNOSIS — Z79811 Long term (current) use of aromatase inhibitors: Secondary | ICD-10-CM | POA: Insufficient documentation

## 2018-11-29 DIAGNOSIS — Z791 Long term (current) use of non-steroidal anti-inflammatories (NSAID): Secondary | ICD-10-CM | POA: Insufficient documentation

## 2018-11-29 LAB — COMPREHENSIVE METABOLIC PANEL
ALT: 12 U/L (ref 0–44)
AST: 21 U/L (ref 15–41)
Albumin: 4 g/dL (ref 3.5–5.0)
Alkaline Phosphatase: 89 U/L (ref 38–126)
Anion gap: 9 (ref 5–15)
BUN: 15 mg/dL (ref 6–20)
CO2: 24 mmol/L (ref 22–32)
Calcium: 9 mg/dL (ref 8.9–10.3)
Chloride: 101 mmol/L (ref 98–111)
Creatinine, Ser: 0.49 mg/dL (ref 0.44–1.00)
GFR calc Af Amer: >60 mL/min (ref >60)
GFR calc non Af Amer: >60 mL/min (ref >60)
Glucose, Bld: 139 mg/dL — ABNORMAL HIGH (ref 70–99)
Potassium: 3.7 mmol/L (ref 3.5–5.1)
Sodium: 134 mmol/L — ABNORMAL LOW (ref 135–145)
Total Bilirubin: 0.5 mg/dL (ref 0.3–1.2)
Total Protein: 6.9 g/dL (ref 6.5–8.1)

## 2018-11-29 LAB — CBC WITH DIFFERENTIAL/PLATELET
Abs Immature Granulocytes: 0.06 10*3/uL (ref 0.00–0.07)
Basophils Absolute: 0 10*3/uL (ref 0.0–0.1)
Basophils Relative: 0 %
Eosinophils Absolute: 0 10*3/uL (ref 0.0–0.5)
Eosinophils Relative: 0 %
HCT: 35.6 % — ABNORMAL LOW (ref 36.0–46.0)
Hemoglobin: 11.9 g/dL — ABNORMAL LOW (ref 12.0–15.0)
Immature Granulocytes: 1 %
Lymphocytes Relative: 8 %
Lymphs Abs: 0.8 10*3/uL (ref 0.7–4.0)
MCH: 31.2 pg (ref 26.0–34.0)
MCHC: 33.4 g/dL (ref 30.0–36.0)
MCV: 93.4 fL (ref 80.0–100.0)
Monocytes Absolute: 0.5 10*3/uL (ref 0.1–1.0)
Monocytes Relative: 6 %
Neutro Abs: 7.8 10*3/uL — ABNORMAL HIGH (ref 1.7–7.7)
Neutrophils Relative %: 85 %
Platelets: 253 10*3/uL (ref 150–400)
RBC: 3.81 MIL/uL — ABNORMAL LOW (ref 3.87–5.11)
RDW: 12.3 % (ref 11.5–15.5)
WBC: 9.1 10*3/uL (ref 4.0–10.5)
nRBC: 0 % (ref 0.0–0.2)

## 2018-11-29 LAB — CBC WITH DIFFERENTIAL (CANCER CENTER ONLY)
Abs Immature Granulocytes: 0.05 10*3/uL (ref 0.00–0.07)
Basophils Absolute: 0 10*3/uL (ref 0.0–0.1)
Basophils Relative: 0 %
Eosinophils Absolute: 0 10*3/uL (ref 0.0–0.5)
Eosinophils Relative: 0 %
HCT: 38.4 % (ref 36.0–46.0)
Hemoglobin: 12.8 g/dL (ref 12.0–15.0)
Immature Granulocytes: 0 %
Lymphocytes Relative: 7 %
Lymphs Abs: 0.8 10*3/uL (ref 0.7–4.0)
MCH: 31.1 pg (ref 26.0–34.0)
MCHC: 33.3 g/dL (ref 30.0–36.0)
MCV: 93.2 fL (ref 80.0–100.0)
Monocytes Absolute: 0.7 10*3/uL (ref 0.1–1.0)
Monocytes Relative: 6 %
Neutro Abs: 10.1 10*3/uL — ABNORMAL HIGH (ref 1.7–7.7)
Neutrophils Relative %: 87 %
Platelet Count: 278 10*3/uL (ref 150–400)
RBC: 4.12 MIL/uL (ref 3.87–5.11)
RDW: 12.3 % (ref 11.5–15.5)
WBC Count: 11.7 10*3/uL — ABNORMAL HIGH (ref 4.0–10.5)
nRBC: 0 % (ref 0.0–0.2)

## 2018-11-29 LAB — CMP (CANCER CENTER ONLY)
ALT: 10 U/L (ref 0–44)
AST: 19 U/L (ref 15–41)
Albumin: 4.1 g/dL (ref 3.5–5.0)
Alkaline Phosphatase: 108 U/L (ref 38–126)
Anion gap: 11 (ref 5–15)
BUN: 17 mg/dL (ref 6–20)
CO2: 25 mmol/L (ref 22–32)
Calcium: 9.3 mg/dL (ref 8.9–10.3)
Chloride: 106 mmol/L (ref 98–111)
Creatinine: 0.62 mg/dL (ref 0.44–1.00)
GFR, Est AFR Am: >60 mL/min (ref >60)
GFR, Estimated: >60 mL/min (ref >60)
Glucose, Bld: 101 mg/dL — ABNORMAL HIGH (ref 70–99)
Potassium: 4.1 mmol/L (ref 3.5–5.1)
Sodium: 142 mmol/L (ref 135–145)
Total Bilirubin: <0.2 mg/dL — ABNORMAL LOW (ref 0.3–1.2)
Total Protein: 7.6 g/dL (ref 6.5–8.1)

## 2018-11-29 LAB — ECHOCARDIOGRAM COMPLETE
Height: 65 in
Weight: 2010.6 oz

## 2018-11-29 MED ORDER — IOHEXOL 300 MG/ML  SOLN
100.0000 mL | Freq: Once | INTRAMUSCULAR | Status: AC | PRN
  Administered 2018-11-29: 100 mL via INTRAVENOUS

## 2018-11-29 MED ORDER — SODIUM CHLORIDE (PF) 0.9 % IJ SOLN
INTRAMUSCULAR | Status: AC
  Filled 2018-11-29: qty 50

## 2018-11-29 MED ORDER — SODIUM CHLORIDE 0.9 % IV SOLN
INTRAVENOUS | Status: DC
  Administered 2018-11-29: 23:00:00 via INTRAVENOUS

## 2018-11-29 MED ORDER — ONDANSETRON HCL 4 MG/2ML IJ SOLN
4.0000 mg | Freq: Once | INTRAMUSCULAR | Status: AC
  Administered 2018-11-29: 4 mg via INTRAVENOUS
  Filled 2018-11-29: qty 2

## 2018-11-29 MED ORDER — FENTANYL CITRATE (PF) 100 MCG/2ML IJ SOLN
100.0000 ug | INTRAMUSCULAR | Status: DC | PRN
  Administered 2018-11-29 – 2018-11-30 (×2): 100 ug via INTRAVENOUS
  Filled 2018-11-29 (×2): qty 2

## 2018-11-29 MED ORDER — SODIUM CHLORIDE 0.9 % IV BOLUS
500.0000 mL | Freq: Once | INTRAVENOUS | Status: AC
  Administered 2018-11-29: 23:00:00 500 mL via INTRAVENOUS

## 2018-11-29 MED ORDER — CYCLOBENZAPRINE HCL 5 MG PO TABS: 5.0000 mg | ORAL_TABLET | Freq: Three times a day (TID) | ORAL | 0 refills | Status: DC | PRN

## 2018-11-29 NOTE — Telephone Encounter (Signed)
With permission from patient and patient's brother, nurse provided schedule to patient's friend whom will be assisting patient to upcoming appointments.  Schedule mailed, and verbally reviewed.

## 2018-11-29 NOTE — ED Notes (Signed)
Pt unable to give urine sample at this time 

## 2018-11-29 NOTE — Progress Notes (Signed)
Radiation Oncology         [phone] ________________________________  Outpatient Re-Consultation (patient was present in cancer center in medical oncology, but entire visit was via Web-Ex.)  Name: Miranda Woods MRN: [id number]  Date: 11/27/2018  DOB: [birth year]  SW:NIOEVO, Ranell Patrick, MD  Nicholas Lose, MD   REFERRING PHYSICIAN: Nicholas Lose, MD  DIAGNOSIS: The encounter diagnosis was Brain metastases [facility]).     ICD-10-CM   1. Brain metastases ([place]) C79.31      HISTORY OF PRESENT ILLNESS::Miranda Woods is a 63 y.o. female who  Is well known by me.  Dr Lindi Adie requested an urgent consultation today due to new brain metastases.     MRI was just performed at [facility] which showed extensive brain metastases.  Per Dr. Geralyn Flash note and confirmation with patient in my conversation with her, Since January she has been experiencing multiple neurological symptoms including visual problems as well as the difficulties with cognition and ability to read.  It has gotten so worse to the point that she has been unable to read any material.  Her work is also been significantly curtailed because of this limitation.  She had even trouble with driving and therefore she came in with her brother.  No seizures. Acuity in vision is diffusely affected.  + HAs, no nausea.  Numerous brain metastases noted.  Some are several cm in size, especially in L occipital lobe.  Staging scans pending.  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      PAST SURGICAL HISTORY: Past Surgical History:  Procedure Laterality Date   ANKLE FRACTURE SURGERY Left    APPENDECTOMY     BREAST BIOPSY Left 09/14/2016   [place]   BREAST BIOPSY Right 06/2017   benign   BREAST BIOPSY Left 09/27/2017   benign   BREAST EXCISIONAL BIOPSY Left 04/23/2017   BREAST LUMPECTOMY Left 03/05/2017   BREAST LUMPECTOMY WITH RADIOACTIVE SEED AND SENTINEL LYMPH NODE BIOPSY Left 03/05/2017   Procedure: LEFT BREAST LUMPECTOMY WITH RADIOACTIVE SEED AND SENTINEL LYMPH NODE BIOPSY AND BLUE DYE INJECTION;  Surgeon: Rolm Bookbinder, MD;  Location: [place];  Service: General;  Laterality: Left;   BREAST LUMPECTOMY WITH RADIOACTIVE SEED LOCALIZATION Left 04/23/2017   Procedure: EXCISION OF LEFT BREAST SEED LOCALIZED MARGINS (TWO SEEDS);  Surgeon: Rolm Bookbinder, MD;  Location: [place];  Service: General;  Laterality: Left;   HUMERUS FRACTURE SURGERY Left    IR GENERIC HISTORICAL  10/10/2016   IR US GUIDE VASC ACCESS RIGHT 10/10/2016 Jacqulynn Cadet, MD [facility]   IR GENERIC HISTORICAL  10/10/2016   IR FLUORO GUIDE PORT INSERTION RIGHT 10/10/2016 Jacqulynn Cadet, MD [facility]   ORIF WRIST FRACTURE     TEMPOROMANDIBULAR JOINT SURGERY  1986    FAMILY HISTORY: family history includes Heart disease in her father; Hypertension in her father; Thyroid disease in her mother.  SOCIAL HISTORY:  reports that she has quit smoking. Her smoking use included cigarettes. She has never used smokeless tobacco. She reports that she does not drink alcohol or use  drugs.  ALLERGIES: Codeine  MEDICATIONS:  Current Outpatient Medications  Medication Sig Dispense Refill   Biotin 5 MG CAPS Take 1 capsule (5 mg total) by mouth daily.  0   Calcium-Magnesium (CALCIUM MAGNESIUM 750) 300-300 MG TABS Take 1 tablet by mouth daily.  0   capecitabine (XELODA) 500 MG tablet Take 2 tablets (1,000 mg total) by mouth 2 (two) times daily after a  meal. 14 days on and 7 days off 56 tablet 6   dexamethasone (DECADRON) 4 MG tablet Take 1 tablet (4 mg total) by mouth 2 (two) times daily. 180 tablet 0   letrozole (FEMARA) 2.5 MG tablet TAKE 1 TABLET(2.5 MG) BY MOUTH DAILY 90 tablet 3   lidocaine-prilocaine (EMLA) cream Apply to affected area once 30 g 3   Melatonin 5 MG TABS Take 1 tablet (5 mg total) by mouth at bedtime as needed. 90 tablet 3   NONFORMULARY OR COMPOUNDED ITEM Take Tukysa (tucatinib) 150mg  tablets, 2 tabs (300mg ) by mouth 2 times daily without regard to food. 120 each 4   UNABLE TO FIND Med Name: Tonic Alcemy. One scoop every morning     UNABLE TO FIND daily. Med Name: protandium herbal supplement     UNABLE TO FIND daily. Med Name: tumeric     No current facility-administered medications for this encounter.    Facility-Administered Medications Ordered in Other Encounters  Medication Dose Route Frequency Provider Last Rate Last Dose   sodium chloride flush (NS) 0.9 % injection 10 mL  10 mL Intravenous PRN Nicholas Lose, MD   10 mL at 01/24/17 1116   sodium chloride flush (NS) 0.9 % injection 10 mL  10 mL Intravenous PRN Nicholas Lose, MD   10 mL at 02/14/17 1236    REVIEW OF SYSTEMS:  As above   PHYSICAL EXAM:  vitals were not taken for this visit.   Speech is fluent, and she is A+O  Demonstrates good insight   Ambulatory   LABORATORY DATA:  Lab Results  Component Value Date   WBC 11.7 (H) 11/29/2018   HGB 12.8 11/29/2018   HCT 38.4 11/29/2018   MCV 93.2 11/29/2018   PLT 278 11/29/2018   CMP     Component Value Date/Time   NA 142 11/29/2018 0932   NA 140 07/11/2017 1015   K 4.1 11/29/2018 0932   K 4.0 07/11/2017 1015   CL 106 11/29/2018 0932   CO2 25 11/29/2018 0932   CO2 24 07/11/2017 1015   GLUCOSE 101 (H) 11/29/2018 0932   GLUCOSE 93 07/11/2017 1015   BUN 17 11/29/2018 0932   BUN 14.2 07/11/2017 1015   CREATININE 0.62 11/29/2018 0932   CREATININE 0.6 07/11/2017 1015   CALCIUM 9.3  11/29/2018 0932   CALCIUM 9.0 07/11/2017 1015   PROT 7.6 11/29/2018 0932   PROT 6.5 07/11/2017 1015   ALBUMIN 4.1 11/29/2018 0932   ALBUMIN 3.7 07/11/2017 1015   AST 19 11/29/2018 0932   AST 14 07/11/2017 1015   ALT 10 11/29/2018 0932   ALT 13 07/11/2017 1015   ALKPHOS 108 11/29/2018 0932   ALKPHOS 66 07/11/2017 1015   BILITOT <0.2 (L) 11/29/2018 0932   BILITOT 0.50 07/11/2017 1015   GFRNONAA >60 11/29/2018 0932   GFRAA >60 11/29/2018 0932         RADIOGRAPHY: Mr Brain W Wo Contrast  Result Date: 11/27/2018 CLINICAL DATA:  Breast cancer.  Vision loss. EXAM: MRI HEAD WITHOUT AND WITH CONTRAST TECHNIQUE: Multiplanar, multiecho pulse sequences of  the brain and surrounding structures were obtained without and with intravenous contrast. CONTRAST:  5 mL Gadovist IV COMPARISON:  None. FINDINGS: Brain: Multiple enhancing brain lesions are present compatible with widespread metastatic disease. Ventricle size is normal. 9 mm of midline shift to the right due to extensive edema from the left occipital lesion. 2 mm enhancing lesion right medial cerebellum 3 mm enhancing lesion right brachium pontis 6 mm lesion right lateral cerebellum has increased signal on T1 and does not enhance. No surrounding edema. This may be incidental area of chronic hemorrhage however does not show susceptibility on gradient echo imaging. Indeterminate lesion. 2 mm enhancing lesion left superior cerebellum 23 mm enhancing lesion right occipital lobe with moderate edema Large enhancing lesion left occipital lobe measuring approximately 4.5 x 3.6 cm. 2 cm tumor related cyst lateral to the mass. The enhancement extends anteriorly toward the left occipital horn. There is extensive surrounding edema with mass-effect. Edema extends into the left corpus callosum and causes midline shift. 16 mm enhancing lesion right superior temporal lobe 27 x 21 mm enhancing lesion right frontal lobe adjacent to the frontal horn with protrusion into  the right ventricle. 7 mm right medial frontal lobe enhancing lesion The patient is at risk for CSF seeding given the lesions in the right frontal lobe and left occipital lobe however no leptomeningeal abnormal enhancement is identified. Small amounts of hemorrhage are noted in the occipital lobe lesions bilaterally as well as in the right frontal lobe lesions and right superior temporal lobe lesion. Vascular: Normal arterial flow voids Skull and upper cervical spine: Negative for metastatic disease. High left parietal bone area of T1 shortening does not enhance and may be an area of hemangioma or fat in the bone marrow Sinuses/Orbits: Mild mucosal edema paranasal sinuses.  Normal orbit Other: None IMPRESSION: Widespread metastatic disease. Multiple enhancing brain lesions. The largest lesion in the left occipital lobe has considerable edema and mass-effect with 9 mm midline shift. Several of the larger lesion show mild hemorrhage. These results were called by telephone at the time of interpretation on 11/27/2018 at 11:05 am to Dr. Nicholas Lose , who verbally acknowledged these results. Electronically Signed   By: Franchot Gallo M.D.   On: 11/27/2018 11:06      IMPRESSION/PLAN: This is a very wonderful woman I know well, now with metastatic disease to the brain.  Steroids started per Dr Lindi Adie.  I had a lengthy discussion with the patient after reviewing their MRI results with them.  We spoke about whole brain radiotherapy versus stereotactic radiosurgery to the brain. We spoke about the differing risks benefits and side effects of both of these treatments. During part of our discussion, we spoke about the hair loss, fatigue and cognitive effects that can result from whole brain radiotherapy.  Additionally, we spoke about radionecrosis that can result from stereotactic radiosurgery. I explained that whole brain radiotherapy is more comprehensive and therefore can decrease the chance of recurrences elsewhere in  the brain, while stereotactic radiosurgery only treats the areas of gross disease while sparing the rest of the brain parenchyma.  After lengthy discussion, I recommended WBRT.  I discussed her with neurosurgery and resection is NOT recommended.  She would like to regroup with family and friends before proceeding.  CT simulation will take place on 11-29-18 tentatively, and treatment on 12-02-18.  I plan to deliver 35 Gy in 14 fractions to [place].  Emotional support given today, this is very, very, hard.  This encounter was provided by telemedicine platform Webex.  The patient has given verbal consent for this type of encounter and has been advised to only accept a meeting of this type in a secure network environment. The time spent during this encounter was 25 minutes. The attendants for this meeting include Eppie Gibson  and [name].  During the encounter, Eppie Gibson was located at [facility] Radiation Oncology Department.  Miranda Woods was located at home.    __________________________________________   Eppie Gibson, MD

## 2018-11-29 NOTE — ED Provider Notes (Signed)
[facility] DEPT Provider Note   CSN: [id number] Arrival date & time: 11/29/18  2059    History   Chief Complaint Chief Complaint  Patient presents with  . Back Pain    HPI Miranda Woods is a 63 y.o. female.     HPI   She presents for evaluation of abdominal pain, which began about 3 PM today.  Pain has worsened.  She is also had nausea and multiple episodes of vomiting since then.  Her oncologist called in a prescription for Zofran, which she is taking without relief of her vomiting.  She also is taking muscle relaxer without relief of her discomfort.  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  She has a pending pet scan.  She is going to receive brain radiation, followed by chemotherapy.  She denies urinary frequency or dysuria.  She has not had any diarrhea.  She had a normal bowel movement today.  No prior similar problems.  She is not currently taking narcotic pain medication.  There are no other known modifying factors   Past Medical History:  Diagnosis Date  . ADHD (attention deficit hyperactivity disorder)   . Breast cancer ([place])    left 2018  . Cancer ([place]) 12/2016   left breast cancer  . 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 Fibrocystic breast disease   . History of radiation therapy 05/21/17- 06/15/17   Left Breast/ 40.05 Gy in 15 fractions, Left Breast Boost/ 10 Gy in 5 fractions.   . Personal history of chemotherapy   . Personal history of radiation therapy     Patient Active Problem List   Diagnosis Date Noted  . Brain metastases ([place]) 11/27/2018  . Goals of care, counseling/discussion 11/27/2018  . Rash 10/26/2016  . Port catheter in place 10/18/2016  . Malignant neoplasm of upper-inner quadrant of left breast in female, estrogen receptor positive ([place]) 09/19/2016  . ADHD (attention deficit hyperactivity disorder)   . Fibrocystic breast disease     Past Surgical History:   Procedure Laterality Date  . ANKLE FRACTURE SURGERY Left   . APPENDECTOMY    . BREAST BIOPSY Left 09/14/2016   CA  . BREAST BIOPSY Right 06/2017   benign  . BREAST BIOPSY Left 09/27/2017   benign  . BREAST EXCISIONAL BIOPSY Left 04/23/2017  . BREAST LUMPECTOMY Left 03/05/2017  . BREAST LUMPECTOMY WITH RADIOACTIVE SEED AND SENTINEL LYMPH NODE BIOPSY Left 03/05/2017   Procedure: LEFT BREAST LUMPECTOMY WITH RADIOACTIVE SEED AND SENTINEL LYMPH NODE BIOPSY AND BLUE DYE INJECTION;  Surgeon: Rolm Bookbinder, MD;  Location: [facility];  Service: General;  Laterality: Left;  . BREAST LUMPECTOMY WITH RADIOACTIVE SEED LOCALIZATION Left 04/23/2017   Procedure: EXCISION OF LEFT BREAST SEED LOCALIZED MARGINS (TWO SEEDS);  Surgeon: Rolm Bookbinder, MD;  Location: [facility];  Service: General;  Laterality: Left;  . HUMERUS FRACTURE SURGERY Left   . IR GENERIC HISTORICAL  10/10/2016   IR US GUIDE VASC ACCESS RIGHT 10/10/2016 Jacqulynn Cadet, MD [facility]  . IR GENERIC HISTORICAL  10/10/2016   IR FLUORO GUIDE PORT INSERTION RIGHT 10/10/2016 Jacqulynn Cadet, MD [facility]  . ORIF WRIST FRACTURE    . TEMPOROMANDIBULAR JOINT SURGERY  1986     OB History   No obstetric history on file.      Home Medications    Prior to Admission medications   Medication Sig Start Date End Date Taking? Authorizing  Provider  Biotin 5 MG CAPS Take 1 capsule (5 mg total) by mouth daily. 10/31/17   Nicholas Lose, MD  Calcium-Magnesium (CALCIUM MAGNESIUM 750) 300-300 MG TABS Take 1 tablet by mouth daily. 10/31/17   Nicholas Lose, MD  capecitabine (XELODA) 500 MG tablet Take 2 tablets (1,000 mg total) by mouth 2 (two) times daily after a meal. 14 days on and 7 days off 11/28/18   Nicholas Lose, MD  cyclobenzaprine (FLEXERIL) 5 MG tablet Take 1 tablet (5 mg total) by mouth 3 (three) times daily as needed for muscle spasms. 11/29/18   Tanner, Lyndon Code., PA-C  dexamethasone (DECADRON) 4 MG tablet  Take 1 tablet (4 mg total) by mouth 2 (two) times daily. 11/27/18   Nicholas Lose, MD  letrozole [facility]) 2.5 MG tablet TAKE 1 TABLET(2.5 MG) BY MOUTH DAILY 07/16/18   Nicholas Lose, MD  lidocaine-prilocaine (EMLA) cream Apply to affected area once 11/27/18   Nicholas Lose, MD  Melatonin 5 MG TABS Take 1 tablet (5 mg total) by mouth at bedtime as needed. 07/11/17   Nicholas Lose, MD  NONFORMULARY OR COMPOUNDED ITEM Take Tukysa (tucatinib) 150mg  tablets, 2 tabs (300mg ) by mouth 2 times daily without regard to food. 11/28/18   Nicholas Lose, MD  UNABLE TO FIND Med Name: Tonic Alcemy. One scoop every morning    [provider]  UNABLE TO FIND daily. Med Name: protandium herbal supplement    [provider]  UNABLE TO FIND daily. Med Name: tumeric    [provider]  prochlorperazine (COMPAZINE) 10 MG tablet Take 1 tablet (10 mg total) by mouth every 6 (six) hours as needed (Nausea or vomiting). Patient not taking: Reported on 11/28/2016 09/28/16 02/13/17  Nicholas Lose, MD    Family History Family History  Problem Relation Age of Onset  . Heart disease Father   . Hypertension Father   . Thyroid disease Mother     Social History Social History   Tobacco Use  . Smoking status: Former Smoker    Types: Cigarettes  . Smokeless tobacco: Never Used  Substance Use Topics  . Alcohol use: No    Comment: ocassional wine  . Drug use: No     Allergies   Codeine   Review of Systems Review of Systems  All other systems reviewed and are negative.    Physical Exam Updated Vital Signs BP 134/82 (BP Location: Left Arm)   Pulse (!) 50   Temp 98.9 F (37.2 C) (Oral)   Resp 16   LMP 11/28/2011   SpO2 99%   Physical Exam Vitals signs and nursing note reviewed.  Constitutional:      General: She is in acute distress.     Appearance: Normal appearance. She is well-developed. She is ill-appearing. She is not toxic-appearing or diaphoretic.  HENT:     Head:  Normocephalic and atraumatic.     Right Ear: External ear normal.     Left Ear: External ear normal.  Eyes:     Conjunctiva/sclera: Conjunctivae normal.     Pupils: Pupils are equal, round, and reactive to light.  Neck:     Musculoskeletal: Normal range of motion and neck supple.     Trachea: Phonation normal.  Cardiovascular:     Rate and Rhythm: Normal rate and regular rhythm.     Heart sounds: Normal heart sounds.  Pulmonary:     Effort: Pulmonary effort is normal.     Breath sounds: Normal breath sounds.  Abdominal:  General: There is no distension.     Palpations: Abdomen is soft. There is no mass.     Tenderness: There is abdominal tenderness (Left upper and lower quadrants, moderate). There is no guarding.  Musculoskeletal: Normal range of motion.        General: No swelling or tenderness.  Skin:    General: Skin is warm and dry.  Neurological:     Mental Status: She is alert and oriented to person, place, and time.     Cranial Nerves: No cranial nerve deficit.     Sensory: No sensory deficit.     Motor: No abnormal muscle tone.     Coordination: Coordination normal.  Psychiatric:        Mood and Affect: Mood normal.        Behavior: Behavior normal.        Thought Content: Thought content normal.        Judgment: Judgment normal.      ED Treatments / Results  Labs (all labs ordered are listed, but only abnormal results are displayed) Labs Reviewed  CBC WITH DIFFERENTIAL/PLATELET - Abnormal; Notable for the following components:      Result Value   RBC 3.81 (*)    Hemoglobin 11.9 (*)    HCT 35.6 (*)    Neutro Abs 7.8 (*)    All other components within normal limits  COMPREHENSIVE METABOLIC PANEL  URINALYSIS, ROUTINE W REFLEX MICROSCOPIC    EKG None  Radiology No results found.  Procedures Procedures (including critical care time)  Medications Ordered in ED Medications  fentaNYL (SUBLIMAZE) injection 100 mcg (100 mcg Intravenous Given 11/29/18  2250)  0.9 %  sodium chloride infusion ( Intravenous New Bag/Given 11/29/18 2245)  ondansetron (ZOFRAN) injection 4 mg (4 mg Intravenous Given 11/29/18 2244)  sodium chloride 0.9 % bolus 500 mL (500 mLs Intravenous New Bag/Given 11/29/18 2256)     Initial Impression / Assessment and Plan / ED Course  I have reviewed the triage vital signs and the nursing notes.  Pertinent labs & imaging results that were available during my care of the patient were reviewed by me and considered in my medical decision making (see chart for details).         Patient Vitals for the past 24 hrs:  BP Temp Temp src Pulse Resp SpO2  11/29/18 2114 134/82 98.9 F (37.2 C) Oral (!) 50 16 99 %    11:20 PM Reevaluation with update and discussion. After initial assessment and treatment, an updated evaluation reveals no change in status.  Labs and CT still pending. Daleen Bo   Medical Decision Making: Abdominal pain, patient with metastatic cancer of the breast.  CT imaging ordered.  CRITICAL CARE-no Performed by: Daleen Bo  Nursing Notes Reviewed/ Care Coordinated Applicable Imaging Reviewed Interpretation of Laboratory Data incorporated into ED treatment  Plan-disposition per oncoming provider team  Final Clinical Impressions(s) / ED Diagnoses   Final diagnoses:  Abdominal pain, unspecified abdominal location  Metastatic breast cancer [facility])    ED Discharge Orders    None       Daleen Bo, MD 11/29/18 2321

## 2018-11-29 NOTE — Telephone Encounter (Signed)
Patient left voicemail complaining of severe back and muscle pain to left side of back.    Nurse returned call.  Pt reports pain developed this afternoon after CT simulation.  Patient has tried NSAIDs, epsom salt soaks, and heating pad to area with minimal relief.    Nurse reviewed with Sandi Mealy, PA-C.  Verbal orders given for Flexeril 5mg  take Q 6 hours as needed #30 with no refills.  Patient notified.  Nurse encouraged patient to utilize on call service this weekend if symptoms worsen.  Voiced agreement and understanding.

## 2018-11-29 NOTE — Progress Notes (Signed)
  Echocardiogram 2D Echocardiogram has been performed.  Darlina Sicilian M 11/29/2018, 10:36 AM

## 2018-11-29 NOTE — Progress Notes (Signed)
SIMULATION AND TREATMENT PLANNING NOTE  Outpatient  DIAGNOSIS:     ICD-10-CM   1. Brain metastases ([place]) C79.31     NARRATIVE:  I met with the patient after she was brought to the [facility].  Identity was confirmed.  All relevant records and images related to the planned course of therapy were reviewed.  The patient freely provided informed written consent to proceed with treatment after reviewing the details related to the planned course of therapy. The consent form was witnessed and verified by the simulation staff.    Then, the patient was set-up in a stable reproducible  supine position for radiation therapy.  An Aquaplast facemask was custom fitted to the patient's anatomy.  CT images were obtained.  Surface markings were placed.  The CT images were loaded into the planning software.      TREATMENT PLANNING NOTE: Treatment planning then occurred.  The radiation prescription was entered and confirmed.    A total of 3 medically necessary complex treatment devices were fabricated and supervised by me, in the form of an Aquaplast mask and 2 fields with MLCs to block globes, pharynx, spinal cord, lenses. Wedges will be used as needed for dose homogeneity.  MORE FIELDS WITH MLCs MAY BE ADDED IN DOSIMETRY for dose homogeneity.  I have requested : Isodose Plan.     The patient will receive 35 Gy in 14 fractions to the whole brain.  -----------------------------------  Eppie Gibson, MD

## 2018-11-29 NOTE — Telephone Encounter (Signed)
Oral Oncology Patient Advocate Encounter  Prior Authorization for Xeloda and Laury Axon has been approved.    Xeloda PA# Vivia Budge PA# [id number]  Xeloda Effective dates: 11/28/18 through 05/30/19 Tukysa Effective dates: 11/28/18 through 11/28/19  Oral Oncology Clinic will continue to follow.   [facility] Patient [place] Phone [phone] Fax [phone] 11/29/2018    8:18 AM

## 2018-11-29 NOTE — Telephone Encounter (Signed)
Received message from patient asking if it was ok to take Miralax.  Called patient back and was unable to leave voicemail because it was full.    Spoke with Dr. Geralyn Flash nurse who states she has since spoken with her and gave her the information.

## 2018-11-29 NOTE — ED Triage Notes (Signed)
Pt c/o low back pain onset 1400 today and has progressively worsened. Ibuproprhen @1499  tylenol PM @ 1630 w/o relief. Pt has Hx CA with mets to the brain brother at bedside d/t pt moderate level of confusion

## 2018-11-29 NOTE — ED Notes (Signed)
Pt back from radiology 

## 2018-11-30 LAB — URINALYSIS, ROUTINE W REFLEX MICROSCOPIC
Bacteria, UA: NONE SEEN
Bilirubin Urine: NEGATIVE
Glucose, UA: NEGATIVE mg/dL
Hgb urine dipstick: NEGATIVE
Ketones, ur: NEGATIVE mg/dL
Nitrite: NEGATIVE
Protein, ur: NEGATIVE mg/dL
Specific Gravity, Urine: 1.04 — ABNORMAL HIGH (ref 1.005–1.030)
pH: 7 (ref 5.0–8.0)

## 2018-11-30 MED ORDER — OXYCODONE HCL 5 MG PO TABS: 5.0000 mg | ORAL_TABLET | ORAL | 0 refills | Status: DC | PRN

## 2018-11-30 MED ORDER — MORPHINE SULFATE (PF) 4 MG/ML IV SOLN
4.0000 mg | Freq: Once | INTRAVENOUS | Status: AC
  Administered 2018-11-30: 02:00:00 4 mg via INTRAVENOUS
  Filled 2018-11-30: qty 1

## 2018-11-30 NOTE — ED Notes (Signed)
Pt ambulated to the bathroom with minimal assistance. Gait steady

## 2018-11-30 NOTE — ED Notes (Addendum)
Urine and culture sent to lab  

## 2018-11-30 NOTE — Discharge Instructions (Signed)
Take ibuprofen or naproxen as needed for pain.  You may add acetaminophen for additional pain relief.  Take oxycodone for pain not relieved by the above medications.

## 2018-11-30 NOTE — ED Provider Notes (Signed)
Care assumed from Dr. Eulis Foster, patient with abdominal pain and back pain and known history of metastatic breast cancer, pending urinalysis and CT of abdomen and pelvis.  Urinalysis was unremarkable.  CT showed evidence of liver metastases and bone metastases.  Metastasis of L3 does correspond to her location of back pain.  She is advised to use over-the-counter NSAIDs and acetaminophen for pain relief, given prescription for oxycodone for additional pain relief.  It is noted that she has an allergy to codeine and she has never taken oxycodone or hydrocodone.  Patient is advised that she may have a similar reaction to the oxycodone.  If that occurs, she will need to work with her PCP or her oncologist for other narcotic options.  Results for orders placed or performed during the hospital encounter of 11/29/18  Comprehensive metabolic panel  Result Value Ref Range   Sodium 134 (L) 135 - 145 mmol/L   Potassium 3.7 3.5 - 5.1 mmol/L   Chloride 101 98 - 111 mmol/L   CO2 24 22 - 32 mmol/L   Glucose, Bld 139 (H) 70 - 99 mg/dL   BUN 15 6 - 20 mg/dL   Creatinine, Ser 0.49 0.44 - 1.00 mg/dL   Calcium 9.0 8.9 - 10.3 mg/dL   Total Protein 6.9 6.5 - 8.1 g/dL   Albumin 4.0 3.5 - 5.0 g/dL   AST 21 15 - 41 U/L   ALT 12 0 - 44 U/L   Alkaline Phosphatase 89 38 - 126 U/L   Total Bilirubin 0.5 0.3 - 1.2 mg/dL   GFR calc non Af Amer >60 >60 mL/min   GFR calc Af Amer >60 >60 mL/min   Anion gap 9 5 - 15  CBC with Differential  Result Value Ref Range   WBC 9.1 4.0 - 10.5 K/uL   RBC 3.81 (L) 3.87 - 5.11 MIL/uL   Hemoglobin 11.9 (L) 12.0 - 15.0 g/dL   HCT 35.6 (L) 36.0 - 46.0 %   MCV 93.4 80.0 - 100.0 fL   MCH 31.2 26.0 - 34.0 pg   MCHC 33.4 30.0 - 36.0 g/dL   RDW 12.3 11.5 - 15.5 %   Platelets 253 150 - 400 K/uL   nRBC 0.0 0.0 - 0.2 %   Neutrophils Relative % 85 %   Neutro Abs 7.8 (H) 1.7 - 7.7 K/uL   Lymphocytes Relative 8 %   Lymphs Abs 0.8 0.7 - 4.0 K/uL   Monocytes Relative 6 %   Monocytes Absolute  0.5 0.1 - 1.0 K/uL   Eosinophils Relative 0 %   Eosinophils Absolute 0.0 0.0 - 0.5 K/uL   Basophils Relative 0 %   Basophils Absolute 0.0 0.0 - 0.1 K/uL   Immature Granulocytes 1 %   Abs Immature Granulocytes 0.06 0.00 - 0.07 K/uL  Urinalysis, Routine w reflex microscopic  Result Value Ref Range   Color, Urine YELLOW YELLOW   APPearance CLEAR CLEAR   Specific Gravity, Urine 1.040 (H) 1.005 - 1.030   pH 7.0 5.0 - 8.0   Glucose, UA NEGATIVE NEGATIVE mg/dL   Hgb urine dipstick NEGATIVE NEGATIVE   Bilirubin Urine NEGATIVE NEGATIVE   Ketones, ur NEGATIVE NEGATIVE mg/dL   Protein, ur NEGATIVE NEGATIVE mg/dL   Nitrite NEGATIVE NEGATIVE   Leukocytes,Ua SMALL (A) NEGATIVE   RBC / HPF 0-5 0 - 5 RBC/hpf   WBC, UA 11-20 0 - 5 WBC/hpf   Bacteria, UA NONE SEEN NONE SEEN   Mucus PRESENT    Mr Brain  W Wo Contrast  Result Date: 11/27/2018 CLINICAL DATA:  Breast cancer.  Vision loss. EXAM: MRI HEAD WITHOUT AND WITH CONTRAST TECHNIQUE: Multiplanar, multiecho pulse sequences of the brain and surrounding structures were obtained without and with intravenous contrast. CONTRAST:  5 mL Gadovist IV COMPARISON:  None. FINDINGS: Brain: Multiple enhancing brain lesions are present compatible with widespread metastatic disease. Ventricle size is normal. 9 mm of midline shift to the right due to extensive edema from the left occipital lesion. 2 mm enhancing lesion right medial cerebellum 3 mm enhancing lesion right brachium pontis 6 mm lesion right lateral cerebellum has increased signal on T1 and does not enhance. No surrounding edema. This may be incidental area of chronic hemorrhage however does not show susceptibility on gradient echo imaging. Indeterminate lesion. 2 mm enhancing lesion left superior cerebellum 23 mm enhancing lesion right occipital lobe with moderate edema Large enhancing lesion left occipital lobe measuring approximately 4.5 x 3.6 cm. 2 cm tumor related cyst lateral to the mass. The enhancement  extends anteriorly toward the left occipital horn. There is extensive surrounding edema with mass-effect. Edema extends into the left corpus callosum and causes midline shift. 16 mm enhancing lesion right superior temporal lobe 27 x 21 mm enhancing lesion right frontal lobe adjacent to the frontal horn with protrusion into the right ventricle. 7 mm right medial frontal lobe enhancing lesion The patient is at risk for CSF seeding given the lesions in the right frontal lobe and left occipital lobe however no leptomeningeal abnormal enhancement is identified. Small amounts of hemorrhage are noted in the occipital lobe lesions bilaterally as well as in the right frontal lobe lesions and right superior temporal lobe lesion. Vascular: Normal arterial flow voids Skull and upper cervical spine: Negative for metastatic disease. High left parietal bone area of T1 shortening does not enhance and may be an area of hemangioma or fat in the bone marrow Sinuses/Orbits: Mild mucosal edema paranasal sinuses.  Normal orbit Other: None IMPRESSION: Widespread metastatic disease. Multiple enhancing brain lesions. The largest lesion in the left occipital lobe has considerable edema and mass-effect with 9 mm midline shift. Several of the larger lesion show mild hemorrhage. These results were called by telephone at the time of interpretation on 11/27/2018 at 11:05 am to Dr. Nicholas Lose , who verbally acknowledged these results. Electronically Signed   By: Franchot Gallo M.D.   On: 11/27/2018 11:06   Ct Abdomen Pelvis W Contrast  Result Date: 11/29/2018 CLINICAL DATA:  Low back pain.  Breast cancer. EXAM: CT ABDOMEN AND PELVIS WITH CONTRAST TECHNIQUE: Multidetector CT imaging of the abdomen and pelvis was performed using the standard protocol following bolus administration of intravenous contrast. CONTRAST:  173mL OMNIPAQUE IOHEXOL 300 MG/ML  SOLN COMPARISON:  None. FINDINGS: Lower chest: Lung bases are clear. No effusions. Heart is  normal size. Hepatobiliary: Numerous low-density lesions throughout the liver. While a few of these may be small cysts, most do not appear to be cystic and likely reflect metastatic disease. Largest lesion is centrally in the right hepatic lobe measuring 12 mm. Gallbladder unremarkable. Pancreas: No focal abnormality or ductal dilatation. Spleen: No focal abnormality.  Normal size. Adrenals/Urinary Tract: No adrenal abnormality. No focal renal abnormality. No stones or hydronephrosis. Urinary bladder is unremarkable. Stomach/Bowel: Normal appendix. Stomach, large and small bowel grossly unremarkable. Vascular/Lymphatic: No evidence of aneurysm or adenopathy. Reproductive: Uterus and adnexa unremarkable.  No mass. Other: No free fluid or free air. Musculoskeletal: Extensive predominantly sclerotic lesions within the iliac  bones bilaterally as well as the L3 vertebral body compatible with osseous metastatic disease. IMPRESSION: Numerous small low-density lesions throughout the liver most compatible with metastases. Sclerotic bone lesions in L3 and bilateral iliac bones compatible with osseous metastatic disease. Electronically Signed   By: Rolm Baptise M.D.   On: 11/29/2018 23:59   Images viewed by me.    Delora Fuel, MD 67/01/10 [phone]

## 2018-11-30 NOTE — ED Notes (Signed)
Pt and brother verbalized discharge instructions and follow up care. Alert and ambulatory. IV removed. Brother is driving pt home. Vitals stable

## 2018-12-01 NOTE — Anesthesia Preprocedure Evaluation (Addendum)
Anesthesia Evaluation  Patient identified by MRN, date of birth, ID band Patient awake    Reviewed: Allergy & Precautions, NPO status , Patient's Chart, lab work & pertinent test results  Airway Mallampati: II  TM Distance: >3 FB Neck ROM: Full    Dental no notable dental hx. (+) Teeth Intact   Pulmonary neg pulmonary ROS, former smoker,    Pulmonary exam normal breath sounds clear to auscultation       Cardiovascular Exercise Tolerance: Good negative cardio ROS Normal cardiovascular exam Rhythm:Regular Rate:Normal  11/29/2018 TTE 1. The left ventricle has normal systolic function with an ejection fraction of 60-65%. The cavity size was normal. Left ventricular diastolic Doppler parameters are indeterminate.  2. The right ventricle has normal systolic function. The cavity was normal. There is no increase in right ventricular wall thickness.  3. Trivial pericardial effusion is present.   Neuro/Psych negative neurological ROS     GI/Hepatic negative GI ROS, Neg liver ROS,   Endo/Other  negative endocrine ROS  Renal/GU negative Renal ROS     Musculoskeletal negative musculoskeletal ROS (+)   Abdominal   Peds  Hematology negative hematology ROS (+)   Anesthesia Other Findings Metastatic Breast CA  Reproductive/Obstetrics                           Anesthesia Physical Anesthesia Plan  ASA: II  Anesthesia Plan: General   Post-op Pain Management:    Induction: Intravenous  PONV Risk Score and Plan: 4 or greater and Treatment may vary due to age or medical condition, Ondansetron and Dexamethasone  Airway Management Planned: LMA  Additional Equipment:   Intra-op Plan:   Post-operative Plan: Extubation in OR  Informed Consent:     Dental advisory given  Plan Discussed with: CRNA  Anesthesia Plan Comments:        Anesthesia Quick Evaluation

## 2018-12-02 ENCOUNTER — Other Ambulatory Visit: Payer: Self-pay | Admitting: Radiation Oncology

## 2018-12-02 ENCOUNTER — Ambulatory Visit (HOSPITAL_BASED_OUTPATIENT_CLINIC_OR_DEPARTMENT_OTHER): Payer: PRIVATE HEALTH INSURANCE | Admitting: Anesthesiology

## 2018-12-02 ENCOUNTER — Ambulatory Visit (HOSPITAL_COMMUNITY): Payer: PRIVATE HEALTH INSURANCE

## 2018-12-02 ENCOUNTER — Encounter (HOSPITAL_BASED_OUTPATIENT_CLINIC_OR_DEPARTMENT_OTHER)
Admission: RE | Disposition: A | Discharge status: Home / Self Care | Payer: Self-pay | Source: Home / Self Care | Attending: General Surgery

## 2018-12-02 ENCOUNTER — Telehealth: Payer: Self-pay | Admitting: Hematology and Oncology

## 2018-12-02 ENCOUNTER — Ambulatory Visit
Admission: RE | Admit: 2018-12-02 | Discharge: 2018-12-02 | Disposition: A | Discharge status: Home / Self Care | Payer: PRIVATE HEALTH INSURANCE | Source: Ambulatory Visit | Attending: Radiation Oncology | Admitting: Radiation Oncology

## 2018-12-02 ENCOUNTER — Encounter (HOSPITAL_BASED_OUTPATIENT_CLINIC_OR_DEPARTMENT_OTHER): Payer: Self-pay | Admitting: Anesthesiology

## 2018-12-02 ENCOUNTER — Other Ambulatory Visit: Payer: Self-pay

## 2018-12-02 ENCOUNTER — Ambulatory Visit (HOSPITAL_BASED_OUTPATIENT_CLINIC_OR_DEPARTMENT_OTHER)
Admission: RE | Admit: 2018-12-02 | Discharge: 2018-12-02 | Disposition: A | Discharge status: Home / Self Care | Payer: PRIVATE HEALTH INSURANCE | Attending: General Surgery | Admitting: General Surgery

## 2018-12-02 DIAGNOSIS — C7931 Secondary malignant neoplasm of brain: Secondary | ICD-10-CM | POA: Insufficient documentation

## 2018-12-02 DIAGNOSIS — C787 Secondary malignant neoplasm of liver and intrahepatic bile duct: Secondary | ICD-10-CM | POA: Insufficient documentation

## 2018-12-02 DIAGNOSIS — Z9221 Personal history of antineoplastic chemotherapy: Secondary | ICD-10-CM | POA: Diagnosis not present

## 2018-12-02 DIAGNOSIS — Z79899 Other long term (current) drug therapy: Secondary | ICD-10-CM | POA: Diagnosis not present

## 2018-12-02 DIAGNOSIS — Z791 Long term (current) use of non-steroidal anti-inflammatories (NSAID): Secondary | ICD-10-CM | POA: Insufficient documentation

## 2018-12-02 DIAGNOSIS — Z419 Encounter for procedure for purposes other than remedying health state, unspecified: Secondary | ICD-10-CM

## 2018-12-02 DIAGNOSIS — C50919 Malignant neoplasm of unspecified site of unspecified female breast: Secondary | ICD-10-CM | POA: Diagnosis present

## 2018-12-02 DIAGNOSIS — F909 Attention-deficit hyperactivity disorder, unspecified type: Secondary | ICD-10-CM | POA: Diagnosis not present

## 2018-12-02 DIAGNOSIS — Z170 Estrogen receptor positive status [ER+]: Secondary | ICD-10-CM | POA: Diagnosis not present

## 2018-12-02 DIAGNOSIS — Z885 Allergy status to narcotic agent status: Secondary | ICD-10-CM | POA: Insufficient documentation

## 2018-12-02 DIAGNOSIS — Z8349 Family history of other endocrine, nutritional and metabolic diseases: Secondary | ICD-10-CM | POA: Insufficient documentation

## 2018-12-02 DIAGNOSIS — I313 Pericardial effusion (noninflammatory): Secondary | ICD-10-CM | POA: Diagnosis not present

## 2018-12-02 DIAGNOSIS — Z87891 Personal history of nicotine dependence: Secondary | ICD-10-CM | POA: Diagnosis not present

## 2018-12-02 DIAGNOSIS — C50912 Malignant neoplasm of unspecified site of left female breast: Secondary | ICD-10-CM | POA: Insufficient documentation

## 2018-12-02 DIAGNOSIS — Z923 Personal history of irradiation: Secondary | ICD-10-CM | POA: Insufficient documentation

## 2018-12-02 DIAGNOSIS — Z8249 Family history of ischemic heart disease and other diseases of the circulatory system: Secondary | ICD-10-CM | POA: Insufficient documentation

## 2018-12-02 DIAGNOSIS — Z95828 Presence of other vascular implants and grafts: Secondary | ICD-10-CM

## 2018-12-02 DIAGNOSIS — Z510 Encounter for antineoplastic radiation therapy: Secondary | ICD-10-CM | POA: Diagnosis not present

## 2018-12-02 DIAGNOSIS — C7951 Secondary malignant neoplasm of bone: Secondary | ICD-10-CM | POA: Insufficient documentation

## 2018-12-02 HISTORY — DX: Secondary malignant neoplasm of brain: C79.31

## 2018-12-02 HISTORY — DX: Malignant neoplasm of unspecified site of left female breast: C50.912

## 2018-12-02 HISTORY — PX: PORTACATH PLACEMENT: SHX2246

## 2018-12-02 SURGERY — INSERTION, TUNNELED CENTRAL VENOUS DEVICE, WITH PORT
Anesthesia: General | Laterality: Right

## 2018-12-02 MED ORDER — FENTANYL CITRATE (PF) 100 MCG/2ML IJ SOLN
25.0000 ug | INTRAMUSCULAR | Status: DC | PRN
  Administered 2018-12-02: 25 ug via INTRAVENOUS
  Administered 2018-12-02: 14:00:00 50 ug via INTRAVENOUS
  Administered 2018-12-02: 25 ug via INTRAVENOUS

## 2018-12-02 MED ORDER — OXYCODONE HCL 5 MG PO TABS: 5.0000 mg | ORAL_TABLET | ORAL | 0 refills | Status: DC | PRN

## 2018-12-02 MED ORDER — MIDAZOLAM HCL 2 MG/2ML IJ SOLN
1.0000 mg | INTRAMUSCULAR | Status: DC | PRN
  Administered 2018-12-02: 2 mg via INTRAVENOUS

## 2018-12-02 MED ORDER — LACTATED RINGERS IV SOLN
INTRAVENOUS | Status: DC
  Administered 2018-12-02: 12:00:00 via INTRAVENOUS

## 2018-12-02 MED ORDER — PROPOFOL 10 MG/ML IV BOLUS
INTRAVENOUS | Status: DC | PRN
  Administered 2018-12-02: 150 mg via INTRAVENOUS

## 2018-12-02 MED ORDER — PROPOFOL 10 MG/ML IV BOLUS
INTRAVENOUS | Status: AC
  Filled 2018-12-02: qty 20

## 2018-12-02 MED ORDER — ENSURE PRE-SURGERY PO LIQD: 296.0000 mL | Freq: Once | ORAL | Status: DC

## 2018-12-02 MED ORDER — SCOPOLAMINE 1 MG/3DAYS TD PT72: 1.0000 | MEDICATED_PATCH | Freq: Once | TRANSDERMAL | Status: DC | PRN

## 2018-12-02 MED ORDER — LIDOCAINE 2% (20 MG/ML) 5 ML SYRINGE
INTRAMUSCULAR | Status: DC | PRN
  Administered 2018-12-02: 80 mg via INTRAVENOUS

## 2018-12-02 MED ORDER — ONDANSETRON HCL 4 MG/2ML IJ SOLN
INTRAMUSCULAR | Status: AC
  Filled 2018-12-02: qty 2

## 2018-12-02 MED ORDER — OXYCODONE HCL 5 MG PO TABS
ORAL_TABLET | ORAL | Status: AC
  Filled 2018-12-02: qty 1

## 2018-12-02 MED ORDER — HEPARIN (PORCINE) IN NACL 2-0.9 UNITS/ML
INTRAMUSCULAR | Status: AC | PRN
  Administered 2018-12-02: 1

## 2018-12-02 MED ORDER — HEPARIN SOD (PORK) LOCK FLUSH 100 UNIT/ML IV SOLN
INTRAVENOUS | Status: DC | PRN
  Administered 2018-12-02: 500 [IU] via INTRAVENOUS

## 2018-12-02 MED ORDER — MIDAZOLAM HCL 2 MG/2ML IJ SOLN
INTRAMUSCULAR | Status: AC
  Filled 2018-12-02: qty 2

## 2018-12-02 MED ORDER — ACETAMINOPHEN 500 MG PO TABS
ORAL_TABLET | ORAL | Status: AC
  Filled 2018-12-02: qty 2

## 2018-12-02 MED ORDER — CEFAZOLIN SODIUM-DEXTROSE 2-4 GM/100ML-% IV SOLN
2.0000 g | INTRAVENOUS | Status: AC
  Administered 2018-12-02: 2 g via INTRAVENOUS

## 2018-12-02 MED ORDER — DEXAMETHASONE SODIUM PHOSPHATE 10 MG/ML IJ SOLN
INTRAMUSCULAR | Status: AC
  Filled 2018-12-02: qty 1

## 2018-12-02 MED ORDER — FENTANYL CITRATE (PF) 100 MCG/2ML IJ SOLN
INTRAMUSCULAR | Status: AC
  Filled 2018-12-02: qty 2

## 2018-12-02 MED ORDER — GABAPENTIN 100 MG PO CAPS
ORAL_CAPSULE | ORAL | Status: AC
  Filled 2018-12-02: qty 1

## 2018-12-02 MED ORDER — SONAFINE EX EMUL
1.0000 "application " | Freq: Once | CUTANEOUS | Status: AC
  Administered 2018-12-02: 1 via TOPICAL

## 2018-12-02 MED ORDER — CEFAZOLIN SODIUM-DEXTROSE 2-4 GM/100ML-% IV SOLN
INTRAVENOUS | Status: AC
  Filled 2018-12-02: qty 100

## 2018-12-02 MED ORDER — GABAPENTIN 100 MG PO CAPS
100.0000 mg | ORAL_CAPSULE | ORAL | Status: AC
  Administered 2018-12-02: 100 mg via ORAL

## 2018-12-02 MED ORDER — BUPIVACAINE HCL (PF) 0.25 % IJ SOLN
INTRAMUSCULAR | Status: DC | PRN
  Administered 2018-12-02: 6 mL

## 2018-12-02 MED ORDER — ACETAMINOPHEN 500 MG PO TABS
1000.0000 mg | ORAL_TABLET | ORAL | Status: AC
  Administered 2018-12-02: 1000 mg via ORAL

## 2018-12-02 MED ORDER — ONDANSETRON HCL 4 MG/2ML IJ SOLN
INTRAMUSCULAR | Status: DC | PRN
  Administered 2018-12-02: 4 mg via INTRAVENOUS

## 2018-12-02 MED ORDER — FENTANYL CITRATE (PF) 100 MCG/2ML IJ SOLN
50.0000 ug | INTRAMUSCULAR | Status: DC | PRN
  Administered 2018-12-02: 50 ug via INTRAVENOUS

## 2018-12-02 MED ORDER — DEXAMETHASONE SODIUM PHOSPHATE 4 MG/ML IJ SOLN
INTRAMUSCULAR | Status: DC | PRN
  Administered 2018-12-02: 10 mg via INTRAVENOUS

## 2018-12-02 MED ORDER — OXYCODONE HCL 5 MG PO TABS
5.0000 mg | ORAL_TABLET | Freq: Once | ORAL | Status: AC | PRN
  Administered 2018-12-02: 5 mg via ORAL

## 2018-12-02 MED ORDER — OXYCODONE HCL 5 MG/5ML PO SOLN: 5.0000 mg | Freq: Once | ORAL | Status: AC | PRN

## 2018-12-02 MED ORDER — ONDANSETRON HCL 4 MG/2ML IJ SOLN: 4.0000 mg | Freq: Once | INTRAMUSCULAR | Status: DC | PRN

## 2018-12-02 SURGICAL SUPPLY — 48 items
BAG DECANTER FOR FLEXI CONT (MISCELLANEOUS) ×2 IMPLANT
BENZOIN TINCTURE PRP APPL 2/3 (GAUZE/BANDAGES/DRESSINGS) ×2 IMPLANT
BLADE SURG 11 STRL SS (BLADE) ×2 IMPLANT
BLADE SURG 15 STRL LF DISP TIS (BLADE) ×1 IMPLANT
BLADE SURG 15 STRL SS (BLADE) ×1
CANISTER SUCT 1200ML W/VALVE (MISCELLANEOUS) IMPLANT
CHLORAPREP W/TINT 26 (MISCELLANEOUS) ×2 IMPLANT
COVER BACK TABLE REUSABLE LG (DRAPES) ×2 IMPLANT
COVER MAYO STAND REUSABLE (DRAPES) ×2 IMPLANT
COVER PROBE 5X48 (MISCELLANEOUS)
COVER WAND RF STERILE (DRAPES) IMPLANT
DECANTER SPIKE VIAL GLASS SM (MISCELLANEOUS) IMPLANT
DERMABOND ADVANCED (GAUZE/BANDAGES/DRESSINGS) ×1
DERMABOND ADVANCED .7 DNX12 (GAUZE/BANDAGES/DRESSINGS) ×1 IMPLANT
DRAPE C-ARM 42X72 X-RAY (DRAPES) ×2 IMPLANT
DRAPE LAPAROSCOPIC ABDOMINAL (DRAPES) ×2 IMPLANT
DRAPE UTILITY XL STRL (DRAPES) ×2 IMPLANT
DRSG TEGADERM 4X4.75 (GAUZE/BANDAGES/DRESSINGS) IMPLANT
ELECT COATED BLADE 2.86 ST (ELECTRODE) ×2 IMPLANT
ELECT REM PT RETURN 9FT ADLT (ELECTROSURGICAL) ×2
ELECTRODE REM PT RTRN 9FT ADLT (ELECTROSURGICAL) ×1 IMPLANT
GAUZE SPONGE 4X4 12PLY STRL LF (GAUZE/BANDAGES/DRESSINGS) ×2 IMPLANT
GLOVE BIO SURGEON STRL SZ7 (GLOVE) ×2 IMPLANT
GLOVE BIOGEL PI IND STRL 7.5 (GLOVE) ×1 IMPLANT
GLOVE BIOGEL PI INDICATOR 7.5 (GLOVE) ×1
GOWN STRL REUS W/ TWL LRG LVL3 (GOWN DISPOSABLE) ×2 IMPLANT
GOWN STRL REUS W/TWL LRG LVL3 (GOWN DISPOSABLE) ×2
IV KIT MINILOC 20X1 SAFETY (NEEDLE) IMPLANT
KIT CVR 48X5XPRB PLUP LF (MISCELLANEOUS) IMPLANT
KIT PORT POWER 8FR ISP CVUE (Port) ×2 IMPLANT
NDL SAFETY ECLIPSE 18X1.5 (NEEDLE) IMPLANT
NEEDLE HYPO 18GX1.5 SHARP (NEEDLE)
NEEDLE HYPO 25X1 1.5 SAFETY (NEEDLE) ×2 IMPLANT
PACK BASIN DAY SURGERY FS (CUSTOM PROCEDURE TRAY) ×2 IMPLANT
PENCIL BUTTON HOLSTER BLD 10FT (ELECTRODE) ×2 IMPLANT
SLEEVE SCD COMPRESS KNEE MED (MISCELLANEOUS) ×2 IMPLANT
STRIP CLOSURE SKIN 1/2X4 (GAUZE/BANDAGES/DRESSINGS) ×2 IMPLANT
SUT MNCRL AB 4-0 PS2 18 (SUTURE) ×2 IMPLANT
SUT PROLENE 2 0 SH DA (SUTURE) ×2 IMPLANT
SUT SILK 2 0 TIES 17X18 (SUTURE)
SUT SILK 2-0 18XBRD TIE BLK (SUTURE) IMPLANT
SUT VIC AB 3-0 SH 27 (SUTURE) ×1
SUT VIC AB 3-0 SH 27X BRD (SUTURE) ×1 IMPLANT
SYR 5ML LUER SLIP (SYRINGE) ×2 IMPLANT
SYR CONTROL 10ML LL (SYRINGE) ×2 IMPLANT
TOWEL GREEN STERILE FF (TOWEL DISPOSABLE) ×2 IMPLANT
TUBE CONNECTING 20X1/4 (TUBING) IMPLANT
YANKAUER SUCT BULB TIP NO VENT (SUCTIONS) IMPLANT

## 2018-12-02 NOTE — Progress Notes (Signed)
Pt here for patient teaching.  Pt given Radiation and You booklet, skin care instructions and Sonafine.  Reviewed areas of pertinence such as fatigue, hair loss, nausea and vomiting, skin changes, headache and blurry vision . Pt able to give teach back of to pat skin, use unscented/gentle soap and drink plenty of water,apply Sonafine bid and avoid applying anything to skin within 4 hours of treatment. Pt verbalizes understanding of information given and will contact nursing with any questions or concerns.     [email or web address]

## 2018-12-02 NOTE — Telephone Encounter (Signed)
Scheduled appt per 5/4 sch message - unable to reach patient . Left message with appt date and time  

## 2018-12-02 NOTE — Discharge Instructions (Signed)

## 2018-12-02 NOTE — Transfer of Care (Signed)
Immediate Anesthesia Transfer of Care Note  Patient: Miranda Woods  Procedure(s) Performed: INSERTION PORT-A-CATH WITH ULTRASOUND (Right )  Patient Location: PACU  Anesthesia Type:General  Level of Consciousness: sedated  Airway & Oxygen Therapy: Patient Spontanous Breathing and Patient connected to face mask oxygen  Post-op Assessment: Report given to RN and Post -op Vital signs reviewed and stable  Post vital signs: Reviewed and stable  Last Vitals:  Vitals Value Taken Time  BP 154/90 12/02/2018  1:08 PM  Temp    Pulse 63 12/02/2018  1:12 PM  Resp 13 12/02/2018  1:12 PM  SpO2 100 % 12/02/2018  1:12 PM  Vitals shown include unvalidated device data.  Last Pain:  Vitals:   12/02/18 1127  TempSrc: Oral  PainSc: 0-No pain         Complications: No apparent anesthesia complications

## 2018-12-02 NOTE — H&P (Signed)
Miranda Woods is an 63 y.o. female.   Chief Complaint: stage IV breast cancer HPI:  63 year old with above-mentioned history of HER-2 positive breast cancer who has been in remission since 2018 who presented to oncology from the MRI which showed extensive brain metastases.  Since January she has been experiencing multiple neurological symptoms including visual problems as well as the difficulties with cognition and ability to read.  It has gotten so worse to the point that she has been unable to read any material.  Her work is also been significantly curtailed because of this limitation.  She had even trouble with driving and therefore she came in with her brother.on mri she has widespread ic metastatic disease.  On ct scan due to abd pain this weekend she also has numerous small lesions in her liver and L3 and iliac bony mets.  She is due to begin chemo again soon.   Past Medical History:  Diagnosis Date  . ADHD (attention deficit hyperactivity disorder)   . Breast cancer ([place])    left 2018  . Cancer ([place]) 12/2016   left breast cancer  . 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 Fibrocystic breast disease   . History of radiation therapy 05/21/17- 06/15/17   Left Breast/ 40.05 Gy in 15 fractions, Left Breast Boost/ 10 Gy in 5 fractions.   . Personal history of chemotherapy   . Personal history of radiation therapy     Past Surgical History:  Procedure Laterality Date  . ANKLE FRACTURE SURGERY Left   . APPENDECTOMY    . BREAST BIOPSY Left 09/14/2016   [place]  . BREAST BIOPSY Right 06/2017   benign  . BREAST BIOPSY Left 09/27/2017   benign  . BREAST EXCISIONAL BIOPSY Left 04/23/2017  . BREAST LUMPECTOMY Left 03/05/2017  . BREAST LUMPECTOMY WITH RADIOACTIVE SEED AND SENTINEL LYMPH NODE BIOPSY Left 03/05/2017   Procedure: LEFT BREAST LUMPECTOMY WITH RADIOACTIVE SEED AND SENTINEL LYMPH NODE BIOPSY AND BLUE DYE INJECTION;  Surgeon: Rolm Bookbinder, MD;  Location: [facility];  Service: General;  Laterality: Left;  . BREAST LUMPECTOMY WITH RADIOACTIVE SEED LOCALIZATION Left 04/23/2017   Procedure: EXCISION OF LEFT BREAST SEED LOCALIZED MARGINS (TWO SEEDS);  Surgeon: Rolm Bookbinder, MD;  Location: [facility];  Service: General;  Laterality: Left;  . HUMERUS FRACTURE SURGERY Left   . IR GENERIC HISTORICAL  10/10/2016   IR US GUIDE VASC ACCESS RIGHT 10/10/2016 Jacqulynn Cadet, MD [facility]  . IR GENERIC HISTORICAL  10/10/2016   IR FLUORO GUIDE PORT INSERTION RIGHT 10/10/2016 Jacqulynn Cadet, MD [facility]  . ORIF WRIST FRACTURE    . TEMPOROMANDIBULAR JOINT SURGERY  1986    Family History  Problem Relation Age of Onset  . Heart disease Father   . Hypertension Father   . Thyroid disease Mother    Social History:  reports that she has quit smoking. Her smoking use included cigarettes. She has never used smokeless tobacco. She reports that she does not drink alcohol or use drugs.  Allergies:  Allergies  Allergen Reactions  . Codeine Hives and Itching    Tolerates hydrocodone    Medications Prior to Admission  Medication Sig Dispense Refill  . cyclobenzaprine (FLEXERIL) 5 MG tablet Take 1 tablet (5 mg total) by mouth 3 (three) times daily as needed for muscle spasms. 30 tablet 0  . diphenhydramine-acetaminophen (TYLENOL PM) 25-500 MG TABS tablet Take 1 tablet by mouth at bedtime as needed (  pain/sleep).    . hydroxypropyl methylcellulose / hypromellose (ISOPTO TEARS / GONIOVISC) 2.5 % ophthalmic solution Place 1 drop into both eyes 3 (three) times daily as needed for dry eyes.    Marland Kitchen ibuprofen (ADVIL) 200 MG tablet Take 400 mg by mouth every 6 (six) hours as needed for moderate pain.    Marland Kitchen letrozole (FEMARA) 2.5 MG tablet TAKE 1 TABLET(2.5 MG) BY MOUTH DAILY (Patient taking differently: Take 2.5 mg by mouth daily. ) 90 tablet 3  . LORazepam (ATIVAN) 1 MG tablet Take 1 mg by mouth every 8 (eight) hours as needed for anxiety.     Marland Kitchen oxyCODONE (ROXICODONE) 5 MG immediate release tablet Take 1 tablet (5 mg total) by mouth every 4 (four) hours as needed for severe pain. 40 tablet 0  . capecitabine (XELODA) 500 MG tablet Take 2 tablets (1,000 mg total) by mouth 2 (two) times daily after a meal. 14 days on and 7 days off 56 tablet 6  . dexamethasone (DECADRON) 4 MG tablet Take 1 tablet (4 mg total) by mouth 2 (two) times daily. 180 tablet 0  . lidocaine-prilocaine (EMLA) cream Apply to affected area once (Patient taking differently: Apply 1 application topically daily as needed (pain). Apply to affected area once) 30 g 3  . NONFORMULARY OR COMPOUNDED ITEM Take Tukysa (tucatinib) 155m tablets, 2 tabs (3078m by mouth 2 times daily without regard to food. 120 each 4    No results found for this or any previous visit (from the past 48 hour(s)). No results found.  ROS Constitutional: Denies fevers, chills or abnormal weight loss Eyes: Loss of peripheral vision Ears, nose, mouth, throat, and face: Denies mucositis or sore throat Respiratory: Denies cough, dyspnea or wheezes Cardiovascular: Denies palpitation, chest discomfort Gastrointestinal:  Denies nausea, heartburn or change in bowel habits Skin: Denies abnormal skin rashes Lymphatics: Denies new lymphadenopathy or easy bruising Neurological: Neurological problems including vision issues. Behavioral/Psych: Depressed mood Extremities: No lower extremity edema    Blood pressure 128/71, pulse 63, temperature 97.9 F (36.6 C), temperature source Oral, resp. rate 20, height _0  (1.651 m), weight 57 kg, last menstrual period 11/28/2011, SpO2 100 %. Physical Exam  GENERAL:alert, no distress and comfortable NECK: supple, thyroid normal size, non-tender, without nodularity LYMPH:  no palpable lymphadenopathy in the cervical, axillary LUNGS: clear to auscultation and percussion with normal breathing effort HEART: regular rate & rhythm and no murmurs and no lower  extremity edema ABDOMEN:abdomen soft, non-tender and normal bowel sounds  Assessment/Plan Stage IV breast cancer -plan port today  MaRolm BookbinderMD 12/02/2018, 12:14 PM

## 2018-12-02 NOTE — Op Note (Signed)
Preoperative diagnosisstage IV breast cancer Postoperative diagnosis: same as above Procedure: right ij US guided powerport insertion Surgeon: Dr Serita Grammes EBL: minimal Anes: general  Specimensnone Complications none Drains none Sponge count correct Dispo to pacu stable  Indications: This is a 73 yof who is prior breast cancer patient now with stage IV disease. Due to begin chemotherapy. We discussed port placement.  Procedure: After informed consent was obtained the patient was taken to the operating room. She was given antibiotics. Sequential compression devices were on her legs. She was then placed under general anesthesia. Then she was prepped and draped in the standard sterile surgical fashion. Surgical timeout was then performed.  Ithenused the ultrasound to identify the right internal jugular vein. I then accessed the vein using the ultrasound.This aspirated blood. I then placed the wire. This was confirmed by fluoroscopy and ultrasound to be in the correct position.I then infiltrated marcaine and made anincision. I created a pocket.I tunneled the line between the 2 sites.I then dilated the tract and placed the dilator assembly with the sheath. This was done under fluoroscopy. I then removed the sheath and dilator. The wire was also removed. The line was then pulled back to be in the venacava. I hooked this up to the port. I sutured this into place with 2-0 Prolene in 2 places. This aspirated blood and flushed easily.This was confirmed with a final fluoroscopy. I then closed this with 2-0 Vicryl and 4-0 Monocryl.This withdrew blood and I placed heparin in it.Dermabond was placed on both the incisions.She tolerated this well and was transferred to the recovery room in stable condition

## 2018-12-02 NOTE — Interval H&P Note (Signed)
History and Physical Interval Note:  12/02/2018 12:17 PM  Miranda Woods  has presented today for surgery, with the diagnosis of breast cancer.  The various methods of treatment have been discussed with the patient and family. After consideration of risks, benefits and other options for treatment, the patient has consented to  Procedure(s): INSERTION PORT-A-CATH WITH ULTRASOUND (N/A) as a surgical intervention.  The patient's history has been reviewed, patient examined, no change in status, stable for surgery.  I have reviewed the patient's chart and labs.  Questions were answered to the patient's satisfaction.     Rolm Bookbinder

## 2018-12-02 NOTE — Anesthesia Procedure Notes (Signed)
Procedure Name: LMA Insertion Date/Time: 12/02/2018 12:26 PM Performed by: Maryella Shivers, CRNA Pre-anesthesia Checklist: Patient identified, Emergency Drugs available, Suction available and Patient being monitored Patient Re-evaluated:Patient Re-evaluated prior to induction Oxygen Delivery Method: Circle system utilized Preoxygenation: Pre-oxygenation with 100% oxygen Induction Type: IV induction Ventilation: Mask ventilation without difficulty LMA: LMA inserted LMA Size: 4.0 Number of attempts: 1 Airway Equipment and Method: Bite block Placement Confirmation: positive ETCO2 Tube secured with: Tape Dental Injury: Teeth and Oropharynx as per pre-operative assessment

## 2018-12-02 NOTE — Anesthesia Postprocedure Evaluation (Signed)
Anesthesia Post Note  Patient: Miranda Woods  Procedure(s) Performed: INSERTION PORT-A-CATH WITH ULTRASOUND (Right )     Patient location during evaluation: PACU Anesthesia Type: General Level of consciousness: awake and alert Pain management: pain level controlled Vital Signs Assessment: post-procedure vital signs reviewed and stable Respiratory status: spontaneous breathing, nonlabored ventilation, respiratory function stable and patient connected to nasal cannula oxygen Cardiovascular status: blood pressure returned to baseline and stable Postop Assessment: no apparent nausea or vomiting Anesthetic complications: no    Last Vitals:  Vitals:   12/02/18 1345 12/02/18 1400  BP: 138/82 (!) 147/85  Pulse: (!) 53 (!) 48  Resp: 14 13  Temp:    SpO2: 98% 100%    Last Pain:  Vitals:   12/02/18 1400  TempSrc:   PainSc: 4                  Barnet Glasgow

## 2018-12-03 ENCOUNTER — Ambulatory Visit
Admission: RE | Admit: 2018-12-03 | Discharge: 2018-12-03 | Disposition: A | Discharge status: Home / Self Care | Payer: PRIVATE HEALTH INSURANCE | Source: Ambulatory Visit | Attending: Radiation Oncology | Admitting: Radiation Oncology

## 2018-12-03 ENCOUNTER — Telehealth: Payer: Self-pay | Admitting: *Deleted

## 2018-12-03 ENCOUNTER — Other Ambulatory Visit: Payer: Self-pay

## 2018-12-03 ENCOUNTER — Encounter (HOSPITAL_BASED_OUTPATIENT_CLINIC_OR_DEPARTMENT_OTHER): Payer: Self-pay | Admitting: General Surgery

## 2018-12-03 DIAGNOSIS — Z510 Encounter for antineoplastic radiation therapy: Secondary | ICD-10-CM | POA: Diagnosis not present

## 2018-12-03 NOTE — Assessment & Plan Note (Signed)
Extensive and widespread brain metastases: Patient has extensive visual symptoms are started off fairly mild in January 2020.  She started losing peripheral vision and later ability to read.  She was also having poor thinking and thought process.  She has no motor deficits.  Denies any sensory deficits.  Brain MRI 11/27/2018: Extensive widespread brain metastases.  Largest tumor in the left occipital lobe 4.5 cm.  Several other large tumors noted in multiple areas.  Extensive smaller tumors are noted including the cerebellum.  Plan: 1.  Dexamethasone 4 mg p.o. twice daily 2.  Palliative whole brain radiation 3.  Consult neuro oncology with Dr. Mickeal Skinner.  I discussed with him and he will evaluate.  He did not think she needs seizure prophylaxis at this time. 4.  PET CT scan: 5.  Xeloda with Tucatinib and Herceptin once radiation is complete ----------------------------------------------------------------------------------------------------------------------- Current treatment: Palliative radiation to the brain. CT abdomen: 11/29/2018: Numerous small low-density lesions in the liver compatible with liver metastases, sclerotic bone lesions in L3 and bilateral iliac bones compatible with osseous metastatic disease.  With there being evidence of systemic metastatic disease, I discussed with her the treatment options include Kadcyla versus Tucatinib with Xeloda and Herceptin.

## 2018-12-03 NOTE — Telephone Encounter (Signed)
Called and spoke with patient about some questions she had regarding her medications.  She is feeling very overwhelmed with everything and has friends and family constantly with her and she wanted to know if it was ok to take an oxycontin and go lay down and shut her door so she can rest and be alone for a bit.  She states she has not taken an oxycontin since last night.  Informed her this was ok. Encouraged her to call with any concerns or needs.  Gave emotional support. Patient appreciative.

## 2018-12-04 ENCOUNTER — Other Ambulatory Visit: Payer: Self-pay

## 2018-12-04 ENCOUNTER — Ambulatory Visit
Admission: RE | Admit: 2018-12-04 | Discharge: 2018-12-04 | Disposition: A | Discharge status: Home / Self Care | Payer: PRIVATE HEALTH INSURANCE | Source: Ambulatory Visit | Attending: Radiation Oncology | Admitting: Radiation Oncology

## 2018-12-04 DIAGNOSIS — Z510 Encounter for antineoplastic radiation therapy: Secondary | ICD-10-CM | POA: Diagnosis not present

## 2018-12-04 NOTE — Telephone Encounter (Signed)
Oral Chemotherapy Pharmacist Encounter   Attempted to reach patient to provide update and offer for initial counseling on oral medications: Tukysa and Xeloda.  No answer.  Left voicemail for patient to call back to discuss details of medication acquisition and initial counseling session.  Johny Drilling, PharmD, BCPS, BCOP  12/04/2018   11:46 AM [facility] [phone]

## 2018-12-05 ENCOUNTER — Ambulatory Visit
Admission: RE | Admit: 2018-12-05 | Discharge: 2018-12-05 | Disposition: A | Discharge status: Home / Self Care | Payer: PRIVATE HEALTH INSURANCE | Source: Ambulatory Visit | Attending: Radiation Oncology | Admitting: Radiation Oncology

## 2018-12-05 ENCOUNTER — Ambulatory Visit (HOSPITAL_COMMUNITY)
Admission: RE | Admit: 2018-12-05 | Discharge: 2018-12-05 | Disposition: A | Discharge status: Home / Self Care | Payer: PRIVATE HEALTH INSURANCE | Source: Ambulatory Visit | Attending: Hematology and Oncology | Admitting: Hematology and Oncology

## 2018-12-05 ENCOUNTER — Other Ambulatory Visit: Payer: Self-pay

## 2018-12-05 DIAGNOSIS — Z170 Estrogen receptor positive status [ER+]: Secondary | ICD-10-CM | POA: Insufficient documentation

## 2018-12-05 DIAGNOSIS — C50212 Malignant neoplasm of upper-inner quadrant of left female breast: Secondary | ICD-10-CM | POA: Diagnosis not present

## 2018-12-05 DIAGNOSIS — C7931 Secondary malignant neoplasm of brain: Secondary | ICD-10-CM | POA: Insufficient documentation

## 2018-12-05 DIAGNOSIS — Z510 Encounter for antineoplastic radiation therapy: Secondary | ICD-10-CM | POA: Diagnosis not present

## 2018-12-05 LAB — GLUCOSE, CAPILLARY: Glucose-Capillary: 118 mg/dL — ABNORMAL HIGH (ref 70–99)

## 2018-12-05 MED ORDER — FLUDEOXYGLUCOSE F - 18 (FDG) INJECTION: 6.2600 | Freq: Once | INTRAVENOUS | Status: DC | PRN

## 2018-12-05 NOTE — Telephone Encounter (Signed)
Oral Oncology Pharmacist Encounter  I briefly spoke with patient and brother, Richardson Landry, for overview of new medication regimen. Patient informed insurance authorization for Laury Axon and Xeloda have been obtained. Test claims at the pharmacy revealed copayment $0 for both medications.  Patient informed very briefly about dosing scheme of Tukysa and Xeloda. I will plan to perform detailed initial counseling session and coordination of medication acquisition during the week of 12/09/2018.  All questions answered. Patient understands and is in agreement with above plan. She knows to call the office with any additional questions or concerns.  Johny Drilling, PharmD, BCPS, BCOP  12/05/2018 4:29 PM [facility]cology Clinic [phone]

## 2018-12-05 NOTE — Progress Notes (Signed)
Patient Care Team: [name], MD as PCP - General (Family Medicine) [name], MD as Consulting Physician (General Surgery) Nicholas Lose, MD as Consulting Physician (Hematology and Oncology) Eppie Gibson, MD as Attending Physician (Radiation Oncology) Gardenia Phlegm, NP as Nurse Practitioner (Hematology and Oncology)  DIAGNOSIS:    ICD-10-CM   1. Brain metastases ([place]) C79.31     SUMMARY OF ONCOLOGIC HISTORY:   Malignant neoplasm of upper-inner quadrant of left breast in female, estrogen receptor positive ([place])   09/14/2016 Initial Diagnosis    Palpable left breast mass for 2 months, ultrasound at 9:00: 4.7 x 3.6 x 3.3 cm, axilla negative: U/S Bx: Grade 2 IDC ER 95%, PR 50%, HER-2 positive ratio 6.17, Ki-67 20%, T2 N0 stage IIA clinical stage    09/27/2016 Breast MRI    Left breast: 3.5 cm irregular necrotic mass; left axillary lymph node 1.2 cm; right breast 0.6 cm mass at 6:00 position 0.5 cm mass right breast lower inner quadrant     10/11/2016 - 01/24/2017 Neo-Adjuvant Chemotherapy    Taxotere, Carboplatin, Herceptin, Perjeta x 6 cycles followed by Herceptin and Perjeta maintenance for one year    01/26/2017 Breast MRI    Left breast: Irregular enhancing mass measuring 2.7 cm, smaller than previous, no abnormal lymph nodes; right breast: No mass or enhancement     03/05/2017 Surgery    Left lumpectomy: IDC grade 2, 2.5 cm, margins negative, 0/2 lymph nodes negative, ER 100%, PR 50%, HER-2 positive ratio 4.88, T2 N0 stage IB AJCC 8     04/17/2017 Mammogram    2 groups of suspicious residual calcifications in the left breast spanning 4.8 cm one is anterior to lumpectomy the other is medial aspect of lumpectomy    05/21/2017 - 06/15/2017 Radiation Therapy    Adjuvant radiation therapy    07/11/2017 -  Anti-estrogen oral therapy    Letrozole 2.5 mg daily; Neratinib started 11/28/2017    12/18/2018 -  Chemotherapy    The patient had trastuzumab (HERCEPTIN)  462 mg in sodium chloride 0.9 % 250 mL chemo infusion, 8 mg/kg = 462 mg, Intravenous,  Once, 0 of 10 cycles  for chemotherapy treatment.      CHIEF COMPLIANT: Metastatic breast cancer  INTERVAL HISTORY: Miranda Woods is a 63 y.o. with above-mentioned history of HER2 positive breast cancer who has been in remission since 2018 and was on letrozole therapy. MRI following vision and cognition issues on 11/27/18 showed extensive brain metastasis. She had a consult with radiation on 11/27/18 and began radiation on 12/03/18. ECHO on 11/29/18 showed ejection fraction in the range of 60-65%. On 11/29/18 she presented to the ED for abdominal pain and a CT abdomen/pelvis showed numerous liver metastases and bone metastases on the L3 and bilateral iliac bones. Her port was inserted on 12/02/18 by Dr. Donne Hazel. PET scan on 12/05/18 showed widespread osseous metastases, multiple liver metastases, a left adrenal metastasis, left supraclavicular lymph node metastases, and a 64m left upper lobe pulmonary nodule.  With IV steroids, her symptoms have improved slightly.  She is able to see partially.  She is able to walk without assistance. She is trying her best to finish all the work that she has left and to take time off.  She presents to the clinic today to discuss treatment following radiation.   REVIEW OF SYSTEMS:   Constitutional: Denies fevers, chills or abnormal weight loss Eyes: Loss of vision Ears, nose, mouth, throat, and face: Denies mucositis or sore  throat Respiratory: Denies cough, dyspnea or wheezes Cardiovascular: Denies palpitation, chest discomfort Gastrointestinal: Denies nausea, heartburn or change in bowel habits Skin: Denies abnormal skin rashes Lymphatics: Denies new lymphadenopathy or easy bruising Neurological: Denies numbness, tingling or new weaknesses Behavioral/Psych: Mood is stable, no new changes  Extremities: No lower extremity edema Breast: denies any pain or lumps or nodules in  either breasts All other systems were reviewed with the patient and are negative.  I have reviewed the past medical history, past surgical history, social history and family history with the patient and they are unchanged from previous note.  ALLERGIES:  is allergic to codeine.  MEDICATIONS:  Current Outpatient Medications  Medication Sig Dispense Refill  . capecitabine (XELODA) 500 MG tablet Take 2 tablets (1,000 mg total) by mouth 2 (two) times daily after a meal. 14 days on and 7 days off 56 tablet 6  . cyclobenzaprine (FLEXERIL) 5 MG tablet Take 1 tablet (5 mg total) by mouth 3 (three) times daily as needed for muscle spasms. 30 tablet 0  . dexamethasone (DECADRON) 4 MG tablet Take 1 tablet (4 mg total) by mouth 2 (two) times daily. 180 tablet 0  . diphenhydramine-acetaminophen (TYLENOL PM) 25-500 MG TABS tablet Take 1 tablet by mouth at bedtime as needed (pain/sleep).    . hydroxypropyl methylcellulose / hypromellose (ISOPTO TEARS / GONIOVISC) 2.5 % ophthalmic solution Place 1 drop into both eyes 3 (three) times daily as needed for dry eyes.    Marland Kitchen ibuprofen (ADVIL) 200 MG tablet Take 400 mg by mouth every 6 (six) hours as needed for moderate pain.    Marland Kitchen letrozole (FEMARA) 2.5 MG tablet TAKE 1 TABLET(2.5 MG) BY MOUTH DAILY (Patient taking differently: Take 2.5 mg by mouth daily. ) 90 tablet 3  . lidocaine-prilocaine (EMLA) cream Apply to affected area once (Patient taking differently: Apply 1 application topically daily as needed (pain). Apply to affected area once) 30 g 3  . LORazepam (ATIVAN) 1 MG tablet Take 1 mg by mouth every 8 (eight) hours as needed for anxiety.    . NONFORMULARY OR COMPOUNDED ITEM Take Tukysa (tucatinib) 191m tablets, 2 tabs (3047m by mouth 2 times daily without regard to food. 120 each 4  . oxyCODONE (ROXICODONE) 5 MG immediate release tablet Take 1 tablet (5 mg total) by mouth every 4 (four) hours as needed for severe pain. 40 tablet 0   No current  facility-administered medications for this visit.    Facility-Administered Medications Ordered in Other Visits  Medication Dose Route Frequency Provider Last Rate Last Dose  . fludeoxyglucose F - 18 (FDG) injection 6.4.09[place]  6.8.11[place] Intravenous Once PRN Poff, JaNicoletta DressMD      . sodium chloride flush (NS) 0.9 % injection 10 mL  10 mL Intravenous PRN GuNicholas LoseMD   10 mL at 01/24/17 1116  . sodium chloride flush (NS) 0.9 % injection 10 mL  10 mL Intravenous PRN GuNicholas LoseMD   10 mL at 02/14/17 1236    PHYSICAL EXAMINATION: ECOG PERFORMANCE STATUS: 1 - Symptomatic but completely ambulatory  Vitals:   12/06/18 1222  BP: (!) 147/90  Pulse: 64  Resp: 18  Temp: 98.7 F (37.1 C)  SpO2: 100%   Filed Weights   12/06/18 1222  Weight: 127 lb 3.2 oz (57.7 kg)    GENERAL: alert, no distress and comfortable SKIN: skin color, texture, turgor are normal, no rashes or significant lesions EYES: normal, Conjunctiva are pink and non-injected, sclera clear OROPHARYNX:  no exudate, no erythema and lips, buccal mucosa, and tongue normal  NECK: supple, thyroid normal size, non-tender, without nodularity LYMPH: no palpable lymphadenopathy in the cervical, axillary or inguinal LUNGS: clear to auscultation and percussion with normal breathing effort HEART: regular rate & rhythm and no murmurs and no lower extremity edema ABDOMEN: abdomen soft, non-tender and normal bowel sounds MUSCULOSKELETAL: no cyanosis of digits and no clubbing  NEURO: alert & oriented x 3 with fluent speech, no focal motor/sensory deficits EXTREMITIES: No lower extremity edema  LABORATORY DATA:  I have reviewed the data as listed CMP Latest Ref Rng & Units 11/29/2018 11/29/2018 11/27/2018  Glucose 70 - 99 mg/dL 139(H) 101(H) -  BUN 6 - 20 mg/dL 15 17 -  Creatinine 0.44 - 1.00 mg/dL 0.49 0.62 0.50  Sodium 135 - 145 mmol/L 134(L) 142 -  Potassium 3.5 - 5.1 mmol/L 3.7 4.1 -  Chloride 98 - 111 mmol/L  101 106 -  CO2 22 - 32 mmol/L 24 25 -  Calcium 8.9 - 10.3 mg/dL 9.0 9.3 -  Total Protein 6.5 - 8.1 g/dL 6.9 7.6 -  Total Bilirubin 0.3 - 1.2 mg/dL 0.5 <0.2(L) -  Alkaline Phos 38 - 126 U/L 89 108 -  AST 15 - 41 U/L 21 19 -  ALT 0 - 44 U/L 12 10 -    Lab Results  Component Value Date   WBC 9.1 11/29/2018   HGB 11.9 (L) 11/29/2018   HCT 35.6 (L) 11/29/2018   MCV 93.4 11/29/2018   PLT 253 11/29/2018   NEUTROABS 7.8 (H) 11/29/2018    ASSESSMENT & PLAN:  Brain metastases ([place]) Extensive and widespread brain metastases: Patient has extensive visual symptoms are started off fairly mild in January 2020.  She started losing peripheral vision and later ability to read.  She was also having poor thinking and thought process.  She has no motor deficits.  Denies any sensory deficits.  Brain MRI 11/27/2018: Extensive widespread brain metastases.  Largest tumor in the left occipital lobe 4.5 cm.  Several other large tumors noted in multiple areas.  Extensive smaller tumors are noted including the cerebellum.  Plan: 1.  Dexamethasone 4 mg p.o. twice daily 2.  Palliative whole brain radiation 3.  Consult neuro oncology with Dr. Mickeal Skinner.  I discussed with him and he will evaluate.  He did not think she needs seizure prophylaxis at this time. 4.  PET CT scan: Widespread metastatic disease in liver, bones, lymph nodes 5.  Xeloda with Tucatinib and Herceptin once radiation is complete ----------------------------------------------------------------------------------------------------------------------- Current treatment: Palliative radiation to the brain. CT abdomen: 11/29/2018: Numerous small low-density lesions in the liver compatible with liver metastases, sclerotic bone lesions in L3 and bilateral iliac bones compatible with osseous metastatic disease.  PET-CT 12/05/18: Ext bone mets (left femur risk of Fx), Extensive liver mets, Adrenal, lung and lymph node mets. Radiology review: Discussed and  reviewed the extensive nature of her disease. Plan: 1. Biopsy one of the Mets: I discussed with interventional radiology. 2. XRT to hip 3. Xeloda Tucatinib and Herceptin: We will start Herceptin next Monday but will hold the Tucatinib until radiation to the hip is complete.  She will also start Xeloda as soon as the medicine is available. 4. [facility] molecular testing  RTC on Monday for Herceptin   No orders of the defined types were placed in this encounter.  The patient has a good understanding of the overall plan. she agrees with it. she will call with any problems  that may develop before the next visit here.  Nicholas Lose, MD 12/06/2018  Julious Oka Dorshimer am acting as scribe for Dr. Nicholas Lose.  I have reviewed the above documentation for accuracy and completeness, and I agree with the above.

## 2018-12-06 ENCOUNTER — Ambulatory Visit
Admission: RE | Admit: 2018-12-06 | Discharge: 2018-12-06 | Disposition: A | Discharge status: Home / Self Care | Payer: PRIVATE HEALTH INSURANCE | Source: Ambulatory Visit | Attending: Radiation Oncology | Admitting: Radiation Oncology

## 2018-12-06 ENCOUNTER — Other Ambulatory Visit: Payer: Self-pay

## 2018-12-06 ENCOUNTER — Telehealth: Payer: Self-pay | Admitting: Pharmacist

## 2018-12-06 ENCOUNTER — Encounter: Payer: Self-pay | Admitting: *Deleted

## 2018-12-06 ENCOUNTER — Encounter: Payer: Self-pay | Admitting: Radiation Oncology

## 2018-12-06 ENCOUNTER — Inpatient Hospital Stay (HOSPITAL_BASED_OUTPATIENT_CLINIC_OR_DEPARTMENT_OTHER): Payer: PRIVATE HEALTH INSURANCE | Admitting: Hematology and Oncology

## 2018-12-06 ENCOUNTER — Other Ambulatory Visit: Payer: Self-pay | Admitting: *Deleted

## 2018-12-06 DIAGNOSIS — C7951 Secondary malignant neoplasm of bone: Secondary | ICD-10-CM | POA: Diagnosis not present

## 2018-12-06 DIAGNOSIS — Z170 Estrogen receptor positive status [ER+]: Secondary | ICD-10-CM

## 2018-12-06 DIAGNOSIS — Z791 Long term (current) use of non-steroidal anti-inflammatories (NSAID): Secondary | ICD-10-CM

## 2018-12-06 DIAGNOSIS — C787 Secondary malignant neoplasm of liver and intrahepatic bile duct: Secondary | ICD-10-CM | POA: Diagnosis not present

## 2018-12-06 DIAGNOSIS — C7931 Secondary malignant neoplasm of brain: Secondary | ICD-10-CM

## 2018-12-06 DIAGNOSIS — Z510 Encounter for antineoplastic radiation therapy: Secondary | ICD-10-CM | POA: Diagnosis not present

## 2018-12-06 DIAGNOSIS — Z79811 Long term (current) use of aromatase inhibitors: Secondary | ICD-10-CM

## 2018-12-06 DIAGNOSIS — C50212 Malignant neoplasm of upper-inner quadrant of left female breast: Secondary | ICD-10-CM

## 2018-12-06 DIAGNOSIS — Z923 Personal history of irradiation: Secondary | ICD-10-CM

## 2018-12-06 DIAGNOSIS — Z79899 Other long term (current) drug therapy: Secondary | ICD-10-CM

## 2018-12-06 MED FILL — CAPECITABINE 500 MG TABS: 500 | 21 days supply | Qty: 56 | Fill #0

## 2018-12-06 NOTE — Progress Notes (Addendum)
  Radiation Oncology         [phone] ________________________________  Name: Miranda Woods MRN: [id number]  Date: 12/06/2018  DOB: 28-Aug-1955  SIMULATION AND TREATMENT PLANNING NOTE  Outpatient  DIAGNOSIS:  C79.51 bone metastases    ICD-10-CM   1. Brain metastases [facility]) C79.31 Ambulatory referral to Occupational Therapy  2. Bone metastases ([place]) C79.51     NARRATIVE:  The patient was brought to the [facility].  Identity was confirmed.  All relevant records and images related to the planned course of therapy were reviewed.  The patient freely provided informed written consent to proceed with treatment after reviewing the details related to the planned course of therapy. The consent form was witnessed and verified by the simulation staff.  We discussed her PET results and her symptoms to target her bone lesions strategically.   Then, the patient was set-up in a stable reproducible  supine position for radiation therapy.  CT images were obtained.  Surface markings were placed.  The CT images were loaded into the planning software.    TREATMENT PLANNING NOTE: Treatment planning then occurred.  The radiation prescription was entered and confirmed.    A total of 8 medically necessary complex treatment devices were fabricated and supervised by me, in the form of 7 fields with MLCs to block kidneys and bowel, and a Vaclock. MORE FIELDS WITH MLCs MAY BE ADDED IN DOSIMETRY for dose homogeneity.  I have requested : 3D Simulation  I have requested a DVH of the following structures: kidneys, bowel, targets.  I have ordered: occupational therapy referral for neurocognitive rehabilitation.   The patient will receive 30 Gy in 10 fractions to the L-S spine and left hip   After simulation I met with the patient and her brother. We reviewed her MRI of brain and PET images together. We discussed her radiation plan in detail and risks/ benefits of such.  We discussed potential  outcomes and prognosis.  She will see Dr Lindi Adie next, today.  -----------------------------------  Eppie Gibson, MD

## 2018-12-06 NOTE — Progress Notes (Signed)
Tumor Profiling Requisition for [employer] filled out and faxed [phone]). Per Dr. Geralyn Flash request.

## 2018-12-06 NOTE — Telephone Encounter (Signed)
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  We specifically focused on the details of Xeloda initiation today and will plan for another detailed counseling conversation in a week focusing on the Tukysa.  Xeloda and Herceptin are bring initiated now, Laury Axon will start after completeion of while brain irradiation (last treatment 12/20/18).  Counseled patient on administration, dosing, side effects, monitoring, drug-food interactions, safe handling, storage, and disposal.  Patient will take Xeloda 541m tablets, 2 tablets (1068m by mouth in AM and 2 tabs (10036mby mouth in PM, within 30 minutes of finishing meals, on days 1-14 of each 21 day cycle.   Patient will take Tukysa 150m10mblets, 2 tablets (300mg36m mouth twice daily, without regard to food, taken continuously.  Herceptin will be administered at standard dosing IV every 3 weeks.   Xeloda start date: 12/07/2018 Herceptin start date: 12/09/2018 TukysLaury Axont date: 12/23/2018  Adverse effects of Xeloda include but are not limited to: fatigue, decreased blood counts, GI upset, diarrhea, mouth sores, and hand-foot syndrome.  Adverse effects of TukysLaury Axon be discussed in detail at next counseling session.  Patient has anti-emetic on hand and knows to take it if nausea develops.   Patient will obtain anti diarrheal and alert the office of 4 or more loose stools above baseline.   Reviewed with patient importance of keeping a medication schedule and plan for any missed doses.  Medication reconciliation performed and medication/allergy list updated.  InsurBiomedical engineerXeloda and TukysLaury Axon been obtained. Test claims at  the pharmacy revealed copayments $0 for 1st fills of Xeloda and TukysTanzaniatient will [facility] and [facility] tomorrow. We will discuss TukysLaury Axonisition next week.  All questions answered.  Deaysia voiced understanding and appreciation.   Patient knows to call the office with questions or concerns.  JesseJohny DrillingrmD, BCPS, BCOP  12/06/2018  3:01 PM Oral Oncology Clinic [phone]

## 2018-12-06 NOTE — Addendum Note (Signed)
Encounter addended by: Eppie Gibson, MD on: 12/06/2018 12:12 PM  Actions taken: Visit diagnoses modified, Problem List modified, Clinical Note Signed, Medication List reviewed, Problem List reviewed, Allergies reviewed

## 2018-12-09 ENCOUNTER — Inpatient Hospital Stay: Payer: PRIVATE HEALTH INSURANCE

## 2018-12-09 ENCOUNTER — Ambulatory Visit
Admission: RE | Admit: 2018-12-09 | Discharge: 2018-12-09 | Disposition: A | Discharge status: Home / Self Care | Payer: PRIVATE HEALTH INSURANCE | Source: Ambulatory Visit | Attending: Radiation Oncology | Admitting: Radiation Oncology

## 2018-12-09 ENCOUNTER — Other Ambulatory Visit: Payer: Self-pay

## 2018-12-09 VITALS — BP 140/75 | HR 61 | Temp 98.8°F | Resp 18

## 2018-12-09 DIAGNOSIS — C50212 Malignant neoplasm of upper-inner quadrant of left female breast: Secondary | ICD-10-CM

## 2018-12-09 DIAGNOSIS — Z7189 Other specified counseling: Secondary | ICD-10-CM

## 2018-12-09 DIAGNOSIS — Z510 Encounter for antineoplastic radiation therapy: Secondary | ICD-10-CM | POA: Diagnosis not present

## 2018-12-09 MED ORDER — ACETAMINOPHEN 325 MG PO TABS
ORAL_TABLET | ORAL | Status: AC
  Filled 2018-12-09: qty 2

## 2018-12-09 MED ORDER — DIPHENHYDRAMINE HCL 25 MG PO CAPS
ORAL_CAPSULE | ORAL | Status: AC
  Filled 2018-12-09: qty 2

## 2018-12-09 MED ORDER — ACETAMINOPHEN 325 MG PO TABS
650.0000 mg | ORAL_TABLET | Freq: Once | ORAL | Status: AC
  Administered 2018-12-09: 650 mg via ORAL

## 2018-12-09 MED ORDER — SODIUM CHLORIDE 0.9 % IV SOLN
Freq: Once | INTRAVENOUS | Status: AC
  Administered 2018-12-09: 11:00:00 via INTRAVENOUS
  Filled 2018-12-09: qty 250

## 2018-12-09 MED ORDER — TRASTUZUMAB CHEMO 150 MG IV SOLR
8.0000 mg/kg | Freq: Once | INTRAVENOUS | Status: AC
  Administered 2018-12-09: 12:00:00 462 mg via INTRAVENOUS
  Filled 2018-12-09: qty 22

## 2018-12-09 MED ORDER — DIPHENHYDRAMINE HCL 25 MG PO CAPS
50.0000 mg | ORAL_CAPSULE | Freq: Once | ORAL | Status: AC
  Administered 2018-12-09: 50 mg via ORAL

## 2018-12-09 MED ORDER — HEPARIN SOD (PORK) LOCK FLUSH 100 UNIT/ML IV SOLN
500.0000 [IU] | Freq: Once | INTRAVENOUS | Status: AC | PRN
  Administered 2018-12-09: 500 [IU]
  Filled 2018-12-09: qty 5

## 2018-12-09 MED ORDER — SODIUM CHLORIDE 0.9% FLUSH
10.0000 mL | INTRAVENOUS | Status: DC | PRN
  Administered 2018-12-09: 10 mL
  Filled 2018-12-09: qty 10

## 2018-12-09 NOTE — Patient Instructions (Signed)
[facility] Discharge Instructions for Patients Receiving Chemotherapy  Today you received the following chemotherapy agents: Herceptin  To help prevent nausea and vomiting after your treatment, we encourage you to take your nausea medication as directed.   If you develop nausea and vomiting that is not controlled by your nausea medication, call the clinic.   BELOW ARE SYMPTOMS THAT SHOULD BE REPORTED IMMEDIATELY:  *FEVER GREATER THAN 100.5 F  *CHILLS WITH OR WITHOUT FEVER  NAUSEA AND VOMITING THAT IS NOT CONTROLLED WITH YOUR NAUSEA MEDICATION  *UNUSUAL SHORTNESS OF BREATH  *UNUSUAL BRUISING OR BLEEDING  TENDERNESS IN MOUTH AND THROAT WITH OR WITHOUT PRESENCE OF ULCERS  *URINARY PROBLEMS  *BOWEL PROBLEMS  UNUSUAL RASH Items with * indicate a potential emergency and should be followed up as soon as possible.  Feel free to call the clinic should you have any questions or concerns. The clinic phone number is [phone].  Please show the CHEMO ALERT CARD at check-in to the Emergency Department and triage nurse.  Coronavirus (COVID-19) Are you at risk?  Are you at risk for the Coronavirus (COVID-19)?  To be considered HIGH RISK for Coronavirus (COVID-19), you have to meet the following criteria:  . Traveled to [place], [place], [place], [place] or [place]; or in the [place] to [place], [place], [place], or [place]; and have fever, cough, and shortness of breath within the last 2 weeks of travel OR . Been in close contact with a person diagnosed with COVID-19 within the last 2 weeks and have fever, cough, and shortness of breath . IF YOU DO NOT MEET THESE CRITERIA, YOU ARE CONSIDERED LOW RISK FOR COVID-19.  What to do if you are HIGH RISK for COVID-19?  . If you are having a medical emergency, call 911. . Seek medical care right away. Before you go to a doctor's office, urgent care or emergency department, call ahead and tell them about your  recent travel, contact with someone diagnosed with COVID-19, and your symptoms. You should receive instructions from your physician's office regarding next steps of care.  . When you arrive at healthcare provider, tell the healthcare staff immediately you have returned from visiting [place], [place], [place], [place] or [place]; or traveled in the [place] to [place], [place], [place], or [place]; in the last two weeks or you have been in close contact with a person diagnosed with COVID-19 in the last 2 weeks.   . Tell the health care staff about your symptoms: fever, cough and shortness of breath. . After you have been seen by a medical provider, you will be either: o Tested for (COVID-19) and discharged home on quarantine except to seek medical care if symptoms worsen, and asked to  - Stay home and avoid contact with others until you get your results (4-5 days)  - Avoid travel on public transportation if possible (such as bus, train, or airplane) or o Sent to the Emergency Department by EMS for evaluation, COVID-19 testing, and possible admission depending on your condition and test results.  What to do if you are LOW RISK for COVID-19?  Reduce your risk of any infection by using the same precautions used for avoiding the common cold or flu:  . Wash your hands often with soap and warm water for at least 20 seconds.  If soap and water are not readily available, use an alcohol-based hand sanitizer with at least 60% alcohol.  . If coughing or sneezing,   cover your mouth and nose by coughing or sneezing into the elbow areas of your shirt or coat, into a tissue or into your sleeve (not your hands). . Avoid shaking hands with others and consider head nods or verbal greetings only. . Avoid touching your eyes, nose, or mouth with unwashed hands.  . Avoid close contact with people who are sick. . Avoid places or events with large numbers of people in one location, like concerts or sporting  events. . Carefully consider travel plans you have or are making. . If you are planning any travel outside or inside the [place], visit the CDC's Travelers' Health webpage for the latest health notices. . If you have some symptoms but not all symptoms, continue to monitor at home and seek medical attention if your symptoms worsen. . If you are having a medical emergency, call 911.   ADDITIONAL HEALTHCARE OPTIONS FOR PATIENTS  [facility] / e-Visit: [email or web address]         [facility]: [phone]  [facility]: [phone]                   [facility]: [phone]     

## 2018-12-10 ENCOUNTER — Ambulatory Visit
Admission: RE | Admit: 2018-12-10 | Discharge: 2018-12-10 | Disposition: A | Discharge status: Home / Self Care | Payer: PRIVATE HEALTH INSURANCE | Source: Ambulatory Visit | Attending: Radiation Oncology | Admitting: Radiation Oncology

## 2018-12-10 ENCOUNTER — Other Ambulatory Visit: Payer: Self-pay

## 2018-12-10 DIAGNOSIS — Z510 Encounter for antineoplastic radiation therapy: Secondary | ICD-10-CM | POA: Diagnosis not present

## 2018-12-11 ENCOUNTER — Other Ambulatory Visit: Payer: Self-pay | Admitting: *Deleted

## 2018-12-11 ENCOUNTER — Other Ambulatory Visit: Payer: Self-pay

## 2018-12-11 ENCOUNTER — Other Ambulatory Visit: Payer: Self-pay | Admitting: Student

## 2018-12-11 ENCOUNTER — Telehealth: Payer: Self-pay | Admitting: *Deleted

## 2018-12-11 ENCOUNTER — Inpatient Hospital Stay (HOSPITAL_BASED_OUTPATIENT_CLINIC_OR_DEPARTMENT_OTHER): Payer: PRIVATE HEALTH INSURANCE | Admitting: Hematology and Oncology

## 2018-12-11 ENCOUNTER — Ambulatory Visit
Admission: RE | Admit: 2018-12-11 | Discharge: 2018-12-11 | Disposition: A | Discharge status: Home / Self Care | Payer: PRIVATE HEALTH INSURANCE | Source: Ambulatory Visit | Attending: Radiation Oncology | Admitting: Radiation Oncology

## 2018-12-11 ENCOUNTER — Ambulatory Visit (HOSPITAL_COMMUNITY)
Admission: RE | Admit: 2018-12-11 | Discharge: 2018-12-11 | Disposition: A | Discharge status: Home / Self Care | Payer: PRIVATE HEALTH INSURANCE | Source: Ambulatory Visit | Attending: Hematology and Oncology | Admitting: Hematology and Oncology

## 2018-12-11 DIAGNOSIS — C7931 Secondary malignant neoplasm of brain: Secondary | ICD-10-CM | POA: Insufficient documentation

## 2018-12-11 DIAGNOSIS — Z170 Estrogen receptor positive status [ER+]: Secondary | ICD-10-CM | POA: Insufficient documentation

## 2018-12-11 DIAGNOSIS — Z923 Personal history of irradiation: Secondary | ICD-10-CM

## 2018-12-11 DIAGNOSIS — Z79899 Other long term (current) drug therapy: Secondary | ICD-10-CM | POA: Insufficient documentation

## 2018-12-11 DIAGNOSIS — Z87891 Personal history of nicotine dependence: Secondary | ICD-10-CM | POA: Diagnosis not present

## 2018-12-11 DIAGNOSIS — C50212 Malignant neoplasm of upper-inner quadrant of left female breast: Secondary | ICD-10-CM | POA: Diagnosis not present

## 2018-12-11 DIAGNOSIS — K769 Liver disease, unspecified: Secondary | ICD-10-CM | POA: Diagnosis not present

## 2018-12-11 DIAGNOSIS — C7951 Secondary malignant neoplasm of bone: Secondary | ICD-10-CM

## 2018-12-11 DIAGNOSIS — Z791 Long term (current) use of non-steroidal anti-inflammatories (NSAID): Secondary | ICD-10-CM

## 2018-12-11 DIAGNOSIS — Z79811 Long term (current) use of aromatase inhibitors: Secondary | ICD-10-CM

## 2018-12-11 DIAGNOSIS — C787 Secondary malignant neoplasm of liver and intrahepatic bile duct: Secondary | ICD-10-CM

## 2018-12-11 DIAGNOSIS — Z853 Personal history of malignant neoplasm of breast: Secondary | ICD-10-CM | POA: Insufficient documentation

## 2018-12-11 DIAGNOSIS — Z510 Encounter for antineoplastic radiation therapy: Secondary | ICD-10-CM | POA: Diagnosis not present

## 2018-12-11 MED ORDER — GADOBUTROL 1 MMOL/ML IV SOLN
5.0000 mL | Freq: Once | INTRAVENOUS | Status: AC | PRN
  Administered 2018-12-11: 5 mL via INTRAVENOUS

## 2018-12-11 NOTE — Telephone Encounter (Signed)
Left VM to return call regarding [facility] she had on 12/02/18.

## 2018-12-11 NOTE — Assessment & Plan Note (Signed)
Extensive and widespread brain metastases: Patient has extensive visual symptoms are started off fairly mild in January 2020. She started losing peripheral vision and later ability to read. She was also having poor thinking and thought process. She has no motor deficits. Denies any sensory deficits.  Brain MRI 11/27/2018: Extensive widespread brain metastases. Largest tumor in the left occipital lobe 4.5 cm. Several other large tumors noted in multiple areas. Extensive smaller tumors are noted including the cerebellum.  Plan: 1.Dexamethasone 4 mg p.o. twice daily 2.Palliative whole brain radiation completed 12/06/2018; left femur radiation ongoing 3.Consult neuro oncology with Dr. Mickeal Skinner. I discussed with him and he will evaluate. He did not think she needs seizure prophylaxis at this time. 4.PET CT scan: Widespread metastatic disease in liver, bones, lymph nodes 5.Xeloda with Tucatiniband Herceptinonce radiation is complete ----------------------------------------------------------------------------------------------------------------------- Current treatment: Radiation of the left femur, Xeloda and Herceptin started 12/09/2018 Ultrasound-guided liver biopsy scheduled for 12/12/2018 We will send [facility] molecular testing on the tissue.  Urgent visit for profound vision impairment with unsteadiness started this morning Differential diagnosis: Radiation related necrosis versus new lesions Plan: Stat MRI brain today. If the MRI shows swelling then we will increase the dexamethasone.  Intractable pain in the hips: Takes oxycodone as needed  Patient has shown remarkable inner strength in dealing with her current adversity.

## 2018-12-11 NOTE — Telephone Encounter (Signed)
Received call from patient stating her vision has gotten progressively worse this morning. She is coming in for xrt radiation. Also spoke with Inez Catalina (friend) who is bringing her to the appoitnments.  She states Miranda Woods was walking up the hall and stumbled and states she couldn't see.  This is a significant change in her vision.  Informed her Dr. Lindi Adie would see her after xrt complete.  Ordered STAT MRI and she is scheduled for 1230pm today.  Gave instructions to patient and her friend Inez Catalina.

## 2018-12-11 NOTE — Progress Notes (Signed)
Patient Care Team: [name], MD as PCP - General (Family Medicine) [name], MD as Consulting Physician (General Surgery) Nicholas Lose, MD as Consulting Physician (Hematology and Oncology) Eppie Gibson, MD as Attending Physician (Radiation Oncology) Gardenia Phlegm, NP as Nurse Practitioner (Hematology and Oncology)  DIAGNOSIS:  Encounter Diagnosis  Name Primary?   Malignant neoplasm of upper-inner quadrant of left breast in female, estrogen receptor positive ([place])     SUMMARY OF ONCOLOGIC HISTORY:   Malignant neoplasm of upper-inner quadrant of left breast in female, estrogen receptor positive ([place])   09/14/2016 Initial Diagnosis    Palpable left breast mass for 2 months, ultrasound at 9:00: 4.7 x 3.6 x 3.3 cm, axilla negative: U/S Bx: Grade 2 IDC ER 95%, PR 50%, HER-2 positive ratio 6.17, Ki-67 20%, T2 N0 stage IIA clinical stage    09/27/2016 Breast MRI    Left breast: 3.5 cm irregular necrotic mass; left axillary lymph node 1.2 cm; right breast 0.6 cm mass at 6:00 position 0.5 cm mass right breast lower inner quadrant     10/11/2016 - 01/24/2017 Neo-Adjuvant Chemotherapy    Taxotere, Carboplatin, Herceptin, Perjeta x 6 cycles followed by Herceptin and Perjeta maintenance for one year    01/26/2017 Breast MRI    Left breast: Irregular enhancing mass measuring 2.7 cm, smaller than previous, no abnormal lymph nodes; right breast: No mass or enhancement     03/05/2017 Surgery    Left lumpectomy: IDC grade 2, 2.5 cm, margins negative, 0/2 lymph nodes negative, ER 100%, PR 50%, HER-2 positive ratio 4.88, T2 N0 stage IB AJCC 8     04/17/2017 Mammogram    2 groups of suspicious residual calcifications in the left breast spanning 4.8 cm one is anterior to lumpectomy the other is medial aspect of lumpectomy    05/21/2017 - 06/15/2017 Radiation Therapy    Adjuvant radiation therapy    07/11/2017 -  Anti-estrogen oral therapy    Letrozole 2.5 mg daily;  Neratinib started 11/28/2017    12/09/2018 -  Chemotherapy    The patient had trastuzumab (HERCEPTIN) 462 mg in sodium chloride 0.9 % 250 mL chemo infusion, 8 mg/kg = 462 mg, Intravenous,  Once, 1 of 10 cycles Administration: 462 mg (12/09/2018)  for chemotherapy treatment.      CHIEF COMPLIANT: Sudden onset of blurred vision started this morning with unsteadiness of gait  INTERVAL HISTORY: Miranda Woods is a 63 year old with above-mentioned metastatic breast cancer with extensive brain and systemic metastases.  She is currently undergoing radiation therapy to the left hip for possible hip fracture risk lesion.  This morning she woke up and her vision became extremely blurry.  This symptom has not improved.  She was also having unsteadiness of the gait because she is not able to see properly.  She still has color perception but she cannot read anymore.  REVIEW OF SYSTEMS:   Constitutional: Denies fevers, chills or abnormal weight loss Eyes: Blurriness of vision Ears, nose, mouth, throat, and face: Denies mucositis or sore throat Respiratory: Denies cough, dyspnea or wheezes Cardiovascular: Denies palpitation, chest discomfort Gastrointestinal:  Denies nausea, heartburn or change in bowel habits Skin: Denies abnormal skin rashes Lymphatics: Denies new lymphadenopathy or easy bruising Neurological: Generalized weakness, vision loss, unsteadiness of gait from lack of sight Behavioral/Psych: Mood is stable, no new changes  Extremities: No lower extremity edema   All other systems were reviewed with the patient and are negative.  I have reviewed the past medical  history, past surgical history, social history and family history with the patient and they are unchanged from previous note.  ALLERGIES:  is allergic to codeine.  MEDICATIONS:  Current Outpatient Medications  Medication Sig Dispense Refill   Calcium-Magnesium-Zinc (CAL-MAG-ZINC PO) Take 1 tablet by mouth daily.      capecitabine (XELODA) 500 MG tablet Take 2 tablets (1,000 mg total) by mouth 2 (two) times daily after a meal. 14 days on and 7 days off 56 tablet 6   Cholecalciferol (VITAMIN D3) 125 MCG (5000 UT) CAPS Take 5,000 Units by mouth daily.     cyclobenzaprine (FLEXERIL) 5 MG tablet Take 1 tablet (5 mg total) by mouth 3 (three) times daily as needed for muscle spasms. (Patient not taking: Reported on 12/10/2018) 30 tablet 0   dexamethasone (DECADRON) 4 MG tablet Take 1 tablet (4 mg total) by mouth 2 (two) times daily. 180 tablet 0   diphenhydrAMINE (BENADRYL) 25 MG tablet Take 50 mg by mouth at bedtime.     hydroxypropyl methylcellulose / hypromellose (ISOPTO TEARS / GONIOVISC) 2.5 % ophthalmic solution Place 1 drop into both eyes 3 (three) times daily as needed for dry eyes.     letrozole (FEMARA) 2.5 MG tablet TAKE 1 TABLET(2.5 MG) BY MOUTH DAILY (Patient not taking: No sig reported) 90 tablet 3   lidocaine-prilocaine (EMLA) cream Apply to affected area once (Patient taking differently: Apply 1 application topically daily as needed (pain). Apply to affected area once) 30 g 3   NONFORMULARY OR COMPOUNDED ITEM Take Tukysa (tucatinib) 163m tablets, 2 tabs (3075m by mouth 2 times daily without regard to food. 120 each 4   oxyCODONE (ROXICODONE) 5 MG immediate release tablet Take 1 tablet (5 mg total) by mouth every 4 (four) hours as needed for severe pain. 40 tablet 0   Turmeric 500 MG CAPS Take 500 mg by mouth daily.     No current facility-administered medications for this visit.    Facility-Administered Medications Ordered in Other Visits  Medication Dose Route Frequency Provider Last Rate Last Dose   sodium chloride flush (NS) 0.9 % injection 10 mL  10 mL Intravenous PRN GuNicholas LoseMD   10 mL at 01/24/17 1116   sodium chloride flush (NS) 0.9 % injection 10 mL  10 mL Intravenous PRN GuNicholas LoseMD   10 mL at 02/14/17 1236    PHYSICAL EXAMINATION: ECOG PERFORMANCE STATUS: 1 -  Symptomatic but completely ambulatory  Vitals:   12/11/18 0945  BP: (!) 142/77  Pulse: 66  Resp: 17  Temp: 97.6 F (36.4 C)  SpO2: 98%   Filed Weights   12/11/18 0945  Weight: 129 lb 11.2 oz (58.8 kg)    GENERAL:alert, no distress and comfortable SKIN: skin color, texture, turgor are normal, no rashes or significant lesions EYES: normal, Conjunctiva are pink and non-injected, sclera clear OROPHARYNX:no exudate, no erythema and lips, buccal mucosa, and tongue normal  NECK: supple, thyroid normal size, non-tender, without nodularity LYMPH:  no palpable lymphadenopathy in the cervical, axillary or inguinal LUNGS: clear to auscultation and percussion with normal breathing effort HEART: regular rate & rhythm and no murmurs and no lower extremity edema ABDOMEN:abdomen soft, non-tender and normal bowel sounds MUSCULOSKELETAL:no cyanosis of digits and no clubbing  NEURO: Unsteadiness of gait related to lack of vision EXTREMITIES: No lower extremity edema   LABORATORY DATA:  I have reviewed the data as listed CMP Latest Ref Rng & Units 11/29/2018 11/29/2018 11/27/2018  Glucose 70 - 99 mg/dL 139(H)  101(H) -  BUN 6 - 20 mg/dL 15 17 -  Creatinine 0.44 - 1.00 mg/dL 0.49 0.62 0.50  Sodium 135 - 145 mmol/L 134(L) 142 -  Potassium 3.5 - 5.1 mmol/L 3.7 4.1 -  Chloride 98 - 111 mmol/L 101 106 -  CO2 22 - 32 mmol/L 24 25 -  Calcium 8.9 - 10.3 mg/dL 9.0 9.3 -  Total Protein 6.5 - 8.1 g/dL 6.9 7.6 -  Total Bilirubin 0.3 - 1.2 mg/dL 0.5 <0.2(L) -  Alkaline Phos 38 - 126 U/L 89 108 -  AST 15 - 41 U/L 21 19 -  ALT 0 - 44 U/L 12 10 -    Lab Results  Component Value Date   WBC 9.1 11/29/2018   HGB 11.9 (L) 11/29/2018   HCT 35.6 (L) 11/29/2018   MCV 93.4 11/29/2018   PLT 253 11/29/2018   NEUTROABS 7.8 (H) 11/29/2018    ASSESSMENT & PLAN:  Malignant neoplasm of upper-inner quadrant of left breast in female, estrogen receptor positive ([place]) Extensive and widespread brain metastases: Patient  has extensive visual symptoms are started off fairly mild in January 2020. She started losing peripheral vision and later ability to read. She was also having poor thinking and thought process. She has no motor deficits. Denies any sensory deficits.  Brain MRI 11/27/2018: Extensive widespread brain metastases. Largest tumor in the left occipital lobe 4.5 cm. Several other large tumors noted in multiple areas. Extensive smaller tumors are noted including the cerebellum.  Plan: 1.Dexamethasone 4 mg p.o. twice daily 2.Palliative whole brain radiation completed 12/06/2018; left femur radiation ongoing 3.Consult neuro oncology with Dr. Mickeal Skinner. I discussed with him and he will evaluate. He did not think she needs seizure prophylaxis at this time. 4.PET CT scan: Widespread metastatic disease in liver, bones, lymph nodes 5.Xeloda with Tucatiniband Herceptinonce radiation is complete ----------------------------------------------------------------------------------------------------------------------- Current treatment: Radiation of the left femur, Xeloda and Herceptin started 12/09/2018 Ultrasound-guided liver biopsy scheduled for 12/12/2018 We will send [facility] molecular testing on the tissue.  Urgent visit for profound vision impairment with unsteadiness started this morning Differential diagnosis: Radiation related necrosis versus new lesions Plan: Stat MRI brain today. If the MRI shows swelling then we will increase the dexamethasone.  Intractable pain in the hips: Takes oxycodone as needed  Patient has shown remarkable inner strength in dealing with her current adversity.     No orders of the defined types were placed in this encounter.  The patient has a good understanding of the overall plan. she agrees with it. she will call with any problems that may develop before the next visit here.   Harriette Ohara, MD 12/11/18

## 2018-12-12 ENCOUNTER — Other Ambulatory Visit: Payer: Self-pay | Admitting: Radiology

## 2018-12-12 ENCOUNTER — Encounter (HOSPITAL_COMMUNITY): Payer: Self-pay

## 2018-12-12 ENCOUNTER — Ambulatory Visit
Admission: RE | Admit: 2018-12-12 | Discharge: 2018-12-12 | Disposition: A | Discharge status: Home / Self Care | Payer: PRIVATE HEALTH INSURANCE | Source: Ambulatory Visit | Attending: Radiation Oncology | Admitting: Radiation Oncology

## 2018-12-12 ENCOUNTER — Ambulatory Visit (HOSPITAL_COMMUNITY)
Admission: RE | Admit: 2018-12-12 | Discharge: 2018-12-12 | Disposition: A | Discharge status: Home / Self Care | Payer: PRIVATE HEALTH INSURANCE | Source: Ambulatory Visit | Attending: Hematology and Oncology | Admitting: Hematology and Oncology

## 2018-12-12 ENCOUNTER — Other Ambulatory Visit: Payer: Self-pay

## 2018-12-12 DIAGNOSIS — C7931 Secondary malignant neoplasm of brain: Secondary | ICD-10-CM | POA: Diagnosis not present

## 2018-12-12 DIAGNOSIS — C50212 Malignant neoplasm of upper-inner quadrant of left female breast: Secondary | ICD-10-CM

## 2018-12-12 DIAGNOSIS — Z510 Encounter for antineoplastic radiation therapy: Secondary | ICD-10-CM | POA: Diagnosis not present

## 2018-12-12 LAB — PROTIME-INR
INR: 1 (ref 0.8–1.2)
Prothrombin Time: 12.9 seconds (ref 11.4–15.2)

## 2018-12-12 LAB — CBC
HCT: 36.6 % (ref 36.0–46.0)
Hemoglobin: 12.1 g/dL (ref 12.0–15.0)
MCH: 31.1 pg (ref 26.0–34.0)
MCHC: 33.1 g/dL (ref 30.0–36.0)
MCV: 94.1 fL (ref 80.0–100.0)
Platelets: 210 10*3/uL (ref 150–400)
RBC: 3.89 MIL/uL (ref 3.87–5.11)
RDW: 12.9 % (ref 11.5–15.5)
WBC: 7.2 10*3/uL (ref 4.0–10.5)
nRBC: 0 % (ref 0.0–0.2)

## 2018-12-12 MED ORDER — FENTANYL CITRATE (PF) 100 MCG/2ML IJ SOLN
INTRAMUSCULAR | Status: AC
  Filled 2018-12-12: qty 2

## 2018-12-12 MED ORDER — FENTANYL CITRATE (PF) 100 MCG/2ML IJ SOLN
INTRAMUSCULAR | Status: AC | PRN
  Administered 2018-12-12: 50 ug via INTRAVENOUS
  Administered 2018-12-12 (×2): 25 ug via INTRAVENOUS

## 2018-12-12 MED ORDER — SODIUM CHLORIDE 0.9 % IV SOLN: INTRAVENOUS | Status: DC

## 2018-12-12 MED ORDER — GELATIN ABSORBABLE 12-7 MM EX MISC
CUTANEOUS | Status: AC
  Filled 2018-12-12: qty 1

## 2018-12-12 MED ORDER — MIDAZOLAM HCL 2 MG/2ML IJ SOLN
INTRAMUSCULAR | Status: AC | PRN
  Administered 2018-12-12: 0.5 mg via INTRAVENOUS
  Administered 2018-12-12: 1 mg via INTRAVENOUS

## 2018-12-12 MED ORDER — MIDAZOLAM HCL 2 MG/2ML IJ SOLN
INTRAMUSCULAR | Status: AC
  Filled 2018-12-12: qty 2

## 2018-12-12 MED ORDER — LIDOCAINE-EPINEPHRINE 1 %-1:100000 IJ SOLN
INTRAMUSCULAR | Status: AC
  Filled 2018-12-12: qty 1

## 2018-12-12 NOTE — Discharge Instructions (Signed)

## 2018-12-12 NOTE — H&P (Signed)
Chief Complaint: Patient was seen in consultation today for liver lesion biopsy at the request of Nazlini  Referring Physician(s): Gudena,Vinay  Supervising Physician: Sandi Mariscal  Patient Status: [facility] - Out-pt  History of Present Illness: Miranda Woods is a 63 y.o. female   Hx Breast Ca 2018 "clean bill" Jan 2020  Developed vision problems recently Work up:  IMPRESSION: 11/27/18 Widespread metastatic disease. Multiple enhancing brain lesions. The largest lesion in the left occipital lobe has considerable edema and mass-effect with 9 mm midline shift. Several of the larger lesion show mild hemorrhage.  IMPRESSION: 1. Widespread hypermetabolic sclerotic osseous metastases throughout the axial and proximal appendicular skeleton, as detailed. Hypermetabolic left femoral neck metastasis places the patient at risk of a pathologic fracture. 2. Multiple hypermetabolic liver metastases. 3. Hypermetabolic left adrenal metastasis. 4. Hypermetabolic lymph node metastases in the left supraclavicular neck and throughout the retroperitoneum. 5. Tiny 3 mm left upper lobe solid pulmonary nodule is below PET resolution, recommend attention on follow-up chest CT in 3-6 months. 6. Moderate colonic and distal ileal stool, suggesting constipation. 7.  Aortic Atherosclerosis (ICD10-I70.0).  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    Personal history of chemotherapy    Personal history of radiation therapy     Past Surgical History:  Procedure Laterality Date    ANKLE FRACTURE SURGERY Left    APPENDECTOMY     BREAST BIOPSY Left 09/14/2016   [place]   BREAST BIOPSY Right 06/2017   benign   BREAST BIOPSY Left 09/27/2017   benign   BREAST EXCISIONAL BIOPSY Left 04/23/2017   BREAST LUMPECTOMY Left 03/05/2017   BREAST LUMPECTOMY WITH RADIOACTIVE SEED AND SENTINEL LYMPH NODE BIOPSY Left 03/05/2017   Procedure: LEFT BREAST LUMPECTOMY WITH RADIOACTIVE SEED AND SENTINEL LYMPH NODE BIOPSY AND BLUE DYE INJECTION;  Surgeon: Rolm Bookbinder, MD;  Location: [place];  Service: General;  Laterality: Left;   BREAST LUMPECTOMY WITH RADIOACTIVE SEED LOCALIZATION Left 04/23/2017   Procedure: EXCISION OF LEFT BREAST SEED LOCALIZED MARGINS (TWO SEEDS);  Surgeon: Rolm Bookbinder, MD;  Location: [place];  Service: General;  Laterality: Left;   HUMERUS FRACTURE SURGERY Left    IR GENERIC HISTORICAL  10/10/2016   IR US GUIDE VASC ACCESS RIGHT 10/10/2016 Jacqulynn Cadet, MD [facility]   IR GENERIC HISTORICAL  10/10/2016   IR FLUORO GUIDE PORT INSERTION RIGHT 10/10/2016 Jacqulynn Cadet, MD [facility]   ORIF WRIST FRACTURE     PORTACATH PLACEMENT Right 12/02/2018   Procedure: INSERTION PORT-A-CATH WITH ULTRASOUND;  Surgeon: Rolm Bookbinder, MD;  Location: [place];  Service: General;  Laterality: Right;   TEMPOROMANDIBULAR JOINT SURGERY  1986    Allergies: Codeine  Medications: Prior to Admission medications   Medication Sig Start Date End Date Taking? Authorizing Provider  Calcium-Magnesium-Zinc (CAL-MAG-ZINC PO) Take 1 tablet by mouth daily.   Yes [provider]  capecitabine (XELODA) 500 MG tablet Take 2 tablets (1,000 mg total) by mouth 2 (two) times daily after a meal. 14  days on and 7 days off 11/28/18  Yes Nicholas Lose, MD  Cholecalciferol (VITAMIN D3) 125 MCG (5000 UT) CAPS Take 5,000 Units by mouth daily.   Yes [provider]  dexamethasone (DECADRON) 4 MG tablet Take 1  tablet (4 mg total) by mouth 2 (two) times daily. 11/27/18  Yes Nicholas Lose, MD  diphenhydrAMINE (BENADRYL) 25 MG tablet Take 50 mg by mouth at bedtime.   Yes [provider]  hydroxypropyl methylcellulose / hypromellose (ISOPTO TEARS / GONIOVISC) 2.5 % ophthalmic solution Place 1 drop into both eyes 3 (three) times daily as needed for dry eyes.   Yes [provider]  oxyCODONE (ROXICODONE) 5 MG immediate release tablet Take 1 tablet (5 mg total) by mouth every 4 (four) hours as needed for severe pain. 12/02/18  Yes Eppie Gibson, MD  Turmeric 500 MG CAPS Take 500 mg by mouth daily.   Yes [provider]  cyclobenzaprine (FLEXERIL) 5 MG tablet Take 1 tablet (5 mg total) by mouth 3 (three) times daily as needed for muscle spasms. Patient not taking: Reported on 12/10/2018 11/29/18   Sandi Mealy E., PA-C  letrozole [facility]) 2.5 MG tablet TAKE 1 TABLET(2.5 MG) BY MOUTH DAILY Patient not taking: No sig reported 07/16/18   Nicholas Lose, MD  lidocaine-prilocaine (EMLA) cream Apply to affected area once Patient taking differently: Apply 1 application topically daily as needed (pain). Apply to affected area once 11/27/18   Nicholas Lose, MD  NONFORMULARY OR COMPOUNDED ITEM Take Tukysa (tucatinib) 150mg  tablets, 2 tabs (300mg ) by mouth 2 times daily without regard to food. 11/28/18   Nicholas Lose, MD  prochlorperazine (COMPAZINE) 10 MG tablet Take 1 tablet (10 mg total) by mouth every 6 (six) hours as needed (Nausea or vomiting). Patient not taking: Reported on 11/28/2016 09/28/16 02/13/17  Nicholas Lose, MD     Family History  Problem Relation Age of Onset   Heart disease Father    Hypertension Father    Thyroid disease Mother     Social History   Socioeconomic History   Marital status: Single    Spouse name: Forensic scientist   Number of children: 0   Years of education: Not on file   Highest education level: Not on file  Occupational History   Occupation: Software engineer strain: Not on file   Food insecurity:    Worry: Not on file    Inability: Not on file   Transportation needs:    Medical: Not on file    Non-medical: Not on file  Tobacco Use   Smoking status: Former Smoker    Types: Cigarettes   Smokeless tobacco: Never Used  Substance and Sexual Activity   Alcohol use: No    Comment: ocassional wine   Drug use: No   Sexual activity: Not Currently    Partners: Male    Birth control/protection: Post-menopausal  Lifestyle   Physical activity:    Days per week: Not on file    Minutes per session: Not on file   Stress: Not on file  Relationships   Social connections:    Talks on phone: Not on file    Gets together: Not on file    Attends religious service: Not on file    Active member of club or organization: Not on file    Attends meetings of clubs or organizations: Not on file    Relationship status: Not on file  Other Topics Concern  Not on file  Social History Narrative   Not on file    Review of Systems: A 12 point ROS discussed and pertinent positives are indicated in the HPI above.  All other systems are negative.  Review of Systems  Constitutional: Positive for activity change and fatigue. Negative for fever.  HENT: Negative for sore throat and trouble swallowing.   Eyes: Positive for visual disturbance.  Respiratory: Negative for cough and shortness of breath.   Cardiovascular: Negative for chest pain.  Gastrointestinal: Negative for abdominal pain.  Musculoskeletal: Negative for back pain.  Neurological: Positive for weakness.  Psychiatric/Behavioral: Negative for behavioral problems and confusion.    Vital Signs: BP 136/82    Pulse (!) 57    Temp 97.7 F (36.5 C) (Oral)    Ht 5\' 5"  (1.651 m)    Wt 129 lb (58.5 kg)    LMP 11/28/2011    SpO2 100%    BMI 21.47 kg/m   Physical Exam Vitals signs reviewed.  Cardiovascular:     Rate and Rhythm: Normal rate and regular  rhythm.     Heart sounds: Normal heart sounds.  Pulmonary:     Effort: Pulmonary effort is normal.     Breath sounds: Normal breath sounds.  Abdominal:     Tenderness: There is no abdominal tenderness.  Musculoskeletal: Normal range of motion.  Skin:    General: Skin is warm and dry.  Neurological:     Mental Status: She is alert and oriented to person, place, and time.  Psychiatric:        Mood and Affect: Mood normal.        Behavior: Behavior normal.        Thought Content: Thought content normal.        Judgment: Judgment normal.     Imaging: Mr Jeri Cos Wo Contrast  Result Date: 12/11/2018 CLINICAL DATA:  Worsening vision impairment with unsteadiness. History of breast cancer with brain metastases. Whole brain radiation therapy completed on 12/06/2018. EXAM: MRI HEAD WITHOUT AND WITH CONTRAST TECHNIQUE: Multiplanar, multiecho pulse sequences of the brain and surrounding structures were obtained without and with intravenous contrast. CONTRAST:  5 mL Gadavist COMPARISON:  11/27/2018 FINDINGS: Brain: A total of 11 brain lesions are again identified consistent with metastases. The largest lesion, located in the left occipital lobe, has slightly decreased in size measuring 4.4 x 3.3 x 6.0 cm (transverse x AP x craniocaudal, previously 4.3 x 3.6 x 6.2 cm when measured in a similar fashion. Cystic components are again noted at the lateral and superior aspects of the mass. Enhancement is again seen extending anteriorly from the mass suggestive of subependymal spread in the left occipital horn. There is moderate surrounding vasogenic edema which has decreased from the prior study. All of the other lesions have not significantly changed in size, and no new lesions are identified. Mild edema associated with a right occipital lesion has mildly decreased. There is new minimal edema associated with a posterior right temporal lobe lesion. Mild susceptibility artifact is again seen within several lesions  compatible with chronic blood products and or mineralization. Some of the lesions are also intrinsically T1 hyperintense including multiple small cerebellar lesions. There is 6 mm of rightward midline shift, decreased from prior epinephrine previously 9 mm). There is no interval ventricular dilatation. No acute infarct or extra-axial fluid collection is identified. Vascular: Major intracranial vascular flow voids are preserved. Skull and upper cervical spine: No suspicious marrow lesion. Unchanged subcentimeter T1 hyperintense focus  in the left parietal bone. Sinuses/Orbits: Unremarkable orbits. Paranasal sinuses and mastoid air cells are clear. Other: None. IMPRESSION: 1. Slightly decreased size of left occipital metastasis with decreased edema. Evidence of subependymal spread in the occipital horn of the left lateral ventricle. 2. Unchanged size of the other brain metastases with mildly decreased right occipital edema and minimal new posterior right temporal edema. 3. No evidence of new intracranial metastases or acute infarct. Electronically Signed   By: Logan Bores M.D.   On: 12/11/2018 14:30   Mr Jeri Cos IR Contrast  Result Date: 11/27/2018 CLINICAL DATA:  Breast cancer.  Vision loss. EXAM: MRI HEAD WITHOUT AND WITH CONTRAST TECHNIQUE: Multiplanar, multiecho pulse sequences of the brain and surrounding structures were obtained without and with intravenous contrast. CONTRAST:  5 mL Gadovist IV COMPARISON:  None. FINDINGS: Brain: Multiple enhancing brain lesions are present compatible with widespread metastatic disease. Ventricle size is normal. 9 mm of midline shift to the right due to extensive edema from the left occipital lesion. 2 mm enhancing lesion right medial cerebellum 3 mm enhancing lesion right brachium pontis 6 mm lesion right lateral cerebellum has increased signal on T1 and does not enhance. No surrounding edema. This may be incidental area of chronic hemorrhage however does not show  susceptibility on gradient echo imaging. Indeterminate lesion. 2 mm enhancing lesion left superior cerebellum 23 mm enhancing lesion right occipital lobe with moderate edema Large enhancing lesion left occipital lobe measuring approximately 4.5 x 3.6 cm. 2 cm tumor related cyst lateral to the mass. The enhancement extends anteriorly toward the left occipital horn. There is extensive surrounding edema with mass-effect. Edema extends into the left corpus callosum and causes midline shift. 16 mm enhancing lesion right superior temporal lobe 27 x 21 mm enhancing lesion right frontal lobe adjacent to the frontal horn with protrusion into the right ventricle. 7 mm right medial frontal lobe enhancing lesion The patient is at risk for CSF seeding given the lesions in the right frontal lobe and left occipital lobe however no leptomeningeal abnormal enhancement is identified. Small amounts of hemorrhage are noted in the occipital lobe lesions bilaterally as well as in the right frontal lobe lesions and right superior temporal lobe lesion. Vascular: Normal arterial flow voids Skull and upper cervical spine: Negative for metastatic disease. High left parietal bone area of T1 shortening does not enhance and may be an area of hemangioma or fat in the bone marrow Sinuses/Orbits: Mild mucosal edema paranasal sinuses.  Normal orbit Other: None IMPRESSION: Widespread metastatic disease. Multiple enhancing brain lesions. The largest lesion in the left occipital lobe has considerable edema and mass-effect with 9 mm midline shift. Several of the larger lesion show mild hemorrhage. These results were called by telephone at the time of interpretation on 11/27/2018 at 11:05 am to Dr. Nicholas Lose , who verbally acknowledged these results. Electronically Signed   By: Franchot Gallo M.D.   On: 11/27/2018 11:06   Ct Abdomen Pelvis W Contrast  Result Date: 11/29/2018 CLINICAL DATA:  Low back pain.  Breast cancer. EXAM: CT ABDOMEN AND PELVIS  WITH CONTRAST TECHNIQUE: Multidetector CT imaging of the abdomen and pelvis was performed using the standard protocol following bolus administration of intravenous contrast. CONTRAST:  134mL OMNIPAQUE IOHEXOL 300 MG/ML  SOLN COMPARISON:  None. FINDINGS: Lower chest: Lung bases are clear. No effusions. Heart is normal size. Hepatobiliary: Numerous low-density lesions throughout the liver. While a few of these may be small cysts, most do not appear to  be cystic and likely reflect metastatic disease. Largest lesion is centrally in the right hepatic lobe measuring 12 mm. Gallbladder unremarkable. Pancreas: No focal abnormality or ductal dilatation. Spleen: No focal abnormality.  Normal size. Adrenals/Urinary Tract: No adrenal abnormality. No focal renal abnormality. No stones or hydronephrosis. Urinary bladder is unremarkable. Stomach/Bowel: Normal appendix. Stomach, large and small bowel grossly unremarkable. Vascular/Lymphatic: No evidence of aneurysm or adenopathy. Reproductive: Uterus and adnexa unremarkable.  No mass. Other: No free fluid or free air. Musculoskeletal: Extensive predominantly sclerotic lesions within the iliac bones bilaterally as well as the L3 vertebral body compatible with osseous metastatic disease. IMPRESSION: Numerous small low-density lesions throughout the liver most compatible with metastases. Sclerotic bone lesions in L3 and bilateral iliac bones compatible with osseous metastatic disease. Electronically Signed   By: Rolm Baptise M.D.   On: 11/29/2018 23:59   Nm Pet Image Initial (pi) Skull Base To Thigh  Result Date: 12/05/2018 CLINICAL DATA:  Initial treatment strategy for recurrent stage IV left breast cancer with brain metastases. EXAM: NUCLEAR MEDICINE PET SKULL BASE TO THIGH TECHNIQUE: 6.2 mCi F-18 FDG was injected intravenously. Full-ring PET imaging was performed from the skull base to thigh after the radiotracer. CT data was obtained and used for attenuation correction and  anatomic localization. Fasting blood glucose: 118 mg/dl COMPARISON:  11/29/2018 CT abdomen/pelvis. FINDINGS: Mediastinal blood pool activity: SUV max 2.4 NECK: Small mildly hypermetabolic left supraclavicular nodes, largest 0.7 cm with max SUV 6.2 (series 4/image 44). No additional hypermetabolic neck lymph nodes. Incidental CT findings: Partially visualized coarse calcification associated with anterior right basal ganglia mass. CHEST: No enlarged or hypermetabolic axillary, mediastinal or hilar lymph nodes. No hypermetabolic pulmonary findings. Incidental CT findings: Right internal jugular Port-A-Cath terminates in the lower third of the SVC. Left anterior descending coronary atherosclerosis. Atherosclerotic nonaneurysmal thoracic aorta. Surgical clips are noted in inner left breast. Peripheral left upper lobe 3 mm solid pulmonary nodule (series 8/image 13), below PET resolution. No acute consolidative airspace disease, lung masses or additional significant pulmonary nodules. ABDOMEN/PELVIS: Several (at least 8) hypermetabolic hypodense liver masses scattered throughout the liver, with representative liver masses as follows: -segment 8 right liver lobe 1.3 cm mass with max SUV 13.4 (series 4/image 90) -peripheral segment 8 right liver lobe 1.0 cm mass with max SUV 7.9 (series 4/image 99) -posterior segment 7 right liver lobe 1.1 cm mass with max SUV 7.5 (series 4/image 97) Hypermetabolic 1.0 cm left adrenal nodule with max SUV 6.8 (series 4/image 104). Small mildly hypermetabolic porta hepatis, portacaval, aortocaval and left para-aortic retroperitoneal nodes. Representative 0.7 cm left para-aortic node with max SUV 3.7 (series 4/image 120). Representative 1.0 cm portacaval node with max SUV 4.6 (series 4/image 105). No hypermetabolic pelvic nodes. No abnormal hypermetabolic activity within the pancreas, right adrenal gland, or spleen. Incidental CT findings: Moderate stool throughout the colon. Stool sign in  distal ileum suggests stasis. Atherosclerotic nonaneurysmal abdominal aorta. SKELETON: There are widespread hypermetabolic sclerotic osseous lesions throughout axial and proximal appendicular skeleton involving the cervical spine, right ribs, sternum, lumbar spine, sacrum, bilateral iliac bones and left femoral neck. Representative osseous lesions as follows: -left femoral neck lesion with max SUV 6.5 -right iliac wing lesion with max SUV 13.5 -medial right upper sacral lesion with max SUV 9.1 -sternal lesion with max SUV 10.6 -posterior right fifth rib lesion with max SUV 4.5 -C3 vertebral lesion with max SUV 9.0 -L3 vertebral lesion with max SUV 6.7 Incidental CT findings: none IMPRESSION: 1. Widespread hypermetabolic sclerotic  osseous metastases throughout the axial and proximal appendicular skeleton, as detailed. Hypermetabolic left femoral neck metastasis places the patient at risk of a pathologic fracture. 2. Multiple hypermetabolic liver metastases. 3. Hypermetabolic left adrenal metastasis. 4. Hypermetabolic lymph node metastases in the left supraclavicular neck and throughout the retroperitoneum. 5. Tiny 3 mm left upper lobe solid pulmonary nodule is below PET resolution, recommend attention on follow-up chest CT in 3-6 months. 6. Moderate colonic and distal ileal stool, suggesting constipation. 7.  Aortic Atherosclerosis (ICD10-I70.0). Electronically Signed   By: Ilona Sorrel M.D.   On: 12/05/2018 14:45   Dg Chest Port 1 View  Result Date: 12/02/2018 CLINICAL DATA:  Status post port  a catheter placement. EXAM: PORTABLE CHEST 1 VIEW COMPARISON:  None. FINDINGS: The heart size and mediastinal contours are within normal limits. Right central venous line is identified with distal tip in the superior vena cava. Both lungs are clear. There is no pneumothorax. The visualized skeletal structures are unremarkable. IMPRESSION: Central venous line is identified with distal tip in the superior vena cava. There is  no pneumothorax. The lungs are clear. Electronically Signed   By: Abelardo Diesel M.D.   On: 12/02/2018 13:26   Dg Fluoro Guide Cv Line-no Report  Result Date: 12/02/2018 Fluoroscopy was utilized by the requesting physician.  No radiographic interpretation.    Labs:  CBC: Recent Labs    05/17/18 1038 11/29/18 0932 11/29/18 2158 12/12/18 1140  WBC 3.4* 11.7* 9.1 7.2  HGB 13.2 12.8 11.9* 12.1  HCT 39.1 38.4 35.6* 36.6  PLT 241 278 253 210    COAGS: Recent Labs    12/12/18 1140  INR 1.0    BMP: Recent Labs    02/08/18 0933 05/17/18 1038 11/27/18 1016 11/29/18 0932 11/29/18 2158  NA 141 139  --  142 134*  K 4.0 4.1  --  4.1 3.7  CL 104 104  --  106 101  CO2 28 26  --  25 24  GLUCOSE 70 106*  --  101* 139*  BUN 12 11  --  17 15  CALCIUM 9.5 9.6  --  9.3 9.0  CREATININE 0.72 0.73 0.50 0.62 0.49  GFRNONAA >60 >60  --  >60 >60  GFRAA >60 >60  --  >60 >60    LIVER FUNCTION TESTS: Recent Labs    02/08/18 0933 05/17/18 1038 11/29/18 0932 11/29/18 2158  BILITOT 0.5 0.5 <0.2* 0.5  AST 17 16 19 21   ALT 15 11 10 12   ALKPHOS 79 75 108 89  PROT 7.3 7.3 7.6 6.9  ALBUMIN 4.4 4.1 4.1 4.0    TUMOR MARKERS: No results for input(s): AFPTM, CEA, CA199, CHROMGRNA in the last 8760 hours.  Assessment and Plan:  Hx Breast Ca New vision changes Work up reveals Brain mets; bony lesions; liver lesions; lung nodules; LAN ands adrenal lesions Scheduled for liver lesion biopsy Risks and benefits of liver lesion was discussed with the patient and/or patient's family including, but not limited to bleeding, infection, damage to adjacent structures or low yield requiring additional tests.  All of the questions were answered and there is agreement to proceed. Consent signed and in chart.  Thank you for this interesting consult.  I greatly enjoyed meeting [employer] and look forward to participating in their care.  A copy of this report was sent to the requesting provider  on this date.  Electronically Signed: Lavonia Drafts, PA-C 12/12/2018, 12:54 PM   I spent a total  of  30 Minutes   in face to face in clinical consultation, greater than 50% of which was counseling/coordinating care for liver lesion biopsy

## 2018-12-12 NOTE — Progress Notes (Addendum)
Patient no c/o pain while ambulating

## 2018-12-13 ENCOUNTER — Telehealth: Payer: Self-pay | Admitting: Pharmacist

## 2018-12-13 ENCOUNTER — Ambulatory Visit
Admission: RE | Admit: 2018-12-13 | Discharge: 2018-12-13 | Disposition: A | Discharge status: Home / Self Care | Payer: PRIVATE HEALTH INSURANCE | Source: Ambulatory Visit | Attending: Radiation Oncology | Admitting: Radiation Oncology

## 2018-12-13 ENCOUNTER — Other Ambulatory Visit: Payer: Self-pay

## 2018-12-13 DIAGNOSIS — Z170 Estrogen receptor positive status [ER+]: Secondary | ICD-10-CM

## 2018-12-13 DIAGNOSIS — Z510 Encounter for antineoplastic radiation therapy: Secondary | ICD-10-CM | POA: Diagnosis not present

## 2018-12-13 DIAGNOSIS — C50212 Malignant neoplasm of upper-inner quadrant of left female breast: Secondary | ICD-10-CM

## 2018-12-13 MED ORDER — ONDANSETRON HCL 8 MG PO TABS: 4.0000 mg | ORAL_TABLET | Freq: Three times a day (TID) | ORAL | 0 refills | Status: DC | PRN

## 2018-12-13 NOTE — Telephone Encounter (Signed)
Oral Chemotherapy Pharmacist Encounter   I spoke with patient and caregiver, Miranda Woods, for overview of new medication regimen consisting of Tukysa (tucatinib) and Xeloda (capecitabine) for the treatment of metastatic, HER-2 receptor positive breast cancer, in conjunction with infusional Herceptin (trastuzumab), planned duration until disease progression or unacceptable toxicity.  We had previously discussed Xeloda and Herceptin initiation on 12/06/2018. All 3 medications were discussed again in detail.  Counseled patient on administration, dosing, side effects, monitoring, drug-food interactions, safe handling, storage, and disposal.  Patient will take Xeloda '500mg'$  tablets, 2 tablets (1000 mg) by mouth in AM and 2 tabs (1000 mg) by mouth in PM, within 30 minutes of finishing meals, on days 1-14 of each 21 day cycle.   Patient will take Tukysa '150mg'$  tablets, 2 tablets (300 mg) by mouth twice daily, without regard to food, taken continuously.  Herceptin will be administered at standard dosing IV every 3 weeks.   Xeloda start date: 12/07/2018 Herceptin start date: 12/09/2018 Laury Axon start date: 12/23/2018  Adverse effects of Xeloda include but are not limited to: fatigue, decreased blood counts, GI upset, diarrhea, mouth sores, and hand-foot syndrome.  Adverse effects of Tukysa include but are not limited to: palmar-plantar erythrodysesthesia, increased liver enzymes, hepatotoxicity, nausea, vomiting, mouth sores, decreased appetite, diarrhea, and electrolyte abnormalities.  Prescription for ondansetron was E scribed to [facility] long outpatient pharmacy today and patient knows to take it if nausea develops.   Patient has already obtained anti diarrheal and alert the office of 4 or more loose stools above baseline.  She is currently struggling with constipation and we discussed MiraLAX dosing.  Reviewed with patient importance of keeping a medication schedule and plan for any missed doses.  Medication  reconciliation performed and medication/allergy list updated.  Biomedical engineer for both Xeloda and to keep so were previously obtained. Test claim at the pharmacy revealed copayment $0 for both medications.  Patient started the Xeloda on 12/07/2018, and with a 2-week on and one-week off schedule, and should be restarting her next cycle on 12/28/2018. In an effort to get her Xeloda and Herceptin on the same schedule, as these are both on an every 3-week cycle, she will wait until 12/30/2018 to start her next cycle of Xeloda. This is the next scheduled day of Herceptin. That way patient knows her Xeloda restarts on the same day as her scheduled Herceptin infusions.  They will pick up the first fill of Tukysa from the [facility] long outpatient pharmacy after radiation appointment on 12/20/2018. Ondansetron prescription will also be picked up at that time.  Insurance will not allow Xeloda prescription to be processed again until 12/23/2018. We will coordinate continued Xeloda acquisition with the patient.  She understands that her Xeloda and her Laury Axon will not be able to be filled at the same time from the pharmacy as the Laury Axon will be dispensed in a 30-day supply and the Xeloda will be dispensed in a 21-day supply.  Patient informed the pharmacy will reach out 5-7 days prior to needing next fills of Xeloda and Tukysa to coordinate continued medication acquisition to prevent break in therapy.  We discussed results of the Her-2 Climb clinical trial published in NEJM in February 2020 which led to the FDA approval of Tukysa.  Patient with questions about dexamethasone dosing. Message has been sent to MD in reference to this.  Miranda Woods stated that patient has quantity #19 oxycodone tablets remaining. I instructed them to reach out to Dr. Lanell Persons desk or Dr. Geralyn Flash desk when a  new prescription for this is needed.  All questions answered.  Jolayne Haines and Miranda Woods voiced understanding and appreciation.    They know to call the office with questions or concerns.  Johny Drilling, PharmD, BCPS, BCOP  12/13/2018  2:06 PM Oral Oncology Clinic [phone]

## 2018-12-16 ENCOUNTER — Other Ambulatory Visit: Payer: Self-pay

## 2018-12-16 ENCOUNTER — Other Ambulatory Visit: Payer: Self-pay | Admitting: Radiation Oncology

## 2018-12-16 ENCOUNTER — Ambulatory Visit
Admission: RE | Admit: 2018-12-16 | Discharge: 2018-12-16 | Disposition: A | Discharge status: Home / Self Care | Payer: PRIVATE HEALTH INSURANCE | Source: Ambulatory Visit | Attending: Radiation Oncology | Admitting: Radiation Oncology

## 2018-12-16 DIAGNOSIS — C7931 Secondary malignant neoplasm of brain: Secondary | ICD-10-CM

## 2018-12-16 DIAGNOSIS — Z510 Encounter for antineoplastic radiation therapy: Secondary | ICD-10-CM | POA: Diagnosis not present

## 2018-12-16 MED ORDER — OXYCODONE HCL 5 MG PO TABS: 5.0000 mg | ORAL_TABLET | ORAL | 0 refills | Status: DC | PRN

## 2018-12-17 ENCOUNTER — Ambulatory Visit
Admission: RE | Admit: 2018-12-17 | Discharge: 2018-12-17 | Disposition: A | Discharge status: Home / Self Care | Payer: PRIVATE HEALTH INSURANCE | Source: Ambulatory Visit | Attending: Radiation Oncology | Admitting: Radiation Oncology

## 2018-12-17 ENCOUNTER — Encounter: Payer: Self-pay | Admitting: Hematology and Oncology

## 2018-12-17 ENCOUNTER — Other Ambulatory Visit: Payer: Self-pay

## 2018-12-17 DIAGNOSIS — Z510 Encounter for antineoplastic radiation therapy: Secondary | ICD-10-CM | POA: Diagnosis not present

## 2018-12-18 ENCOUNTER — Other Ambulatory Visit: Payer: Self-pay

## 2018-12-18 ENCOUNTER — Encounter: Payer: Self-pay | Admitting: Hematology and Oncology

## 2018-12-18 ENCOUNTER — Ambulatory Visit
Admission: RE | Admit: 2018-12-18 | Discharge: 2018-12-18 | Disposition: A | Discharge status: Home / Self Care | Payer: PRIVATE HEALTH INSURANCE | Source: Ambulatory Visit | Attending: Radiation Oncology | Admitting: Radiation Oncology

## 2018-12-18 ENCOUNTER — Other Ambulatory Visit: Payer: Self-pay | Admitting: Pharmacist

## 2018-12-18 ENCOUNTER — Inpatient Hospital Stay: Payer: PRIVATE HEALTH INSURANCE

## 2018-12-18 DIAGNOSIS — Z510 Encounter for antineoplastic radiation therapy: Secondary | ICD-10-CM | POA: Diagnosis not present

## 2018-12-19 ENCOUNTER — Telehealth: Payer: Self-pay | Admitting: *Deleted

## 2018-12-19 ENCOUNTER — Ambulatory Visit
Admission: RE | Admit: 2018-12-19 | Discharge: 2018-12-19 | Disposition: A | Discharge status: Home / Self Care | Payer: PRIVATE HEALTH INSURANCE | Source: Ambulatory Visit | Attending: Radiation Oncology | Admitting: Radiation Oncology

## 2018-12-19 ENCOUNTER — Other Ambulatory Visit: Payer: Self-pay

## 2018-12-19 ENCOUNTER — Other Ambulatory Visit: Payer: Self-pay | Admitting: *Deleted

## 2018-12-19 ENCOUNTER — Encounter: Payer: Self-pay | Admitting: Pharmacist

## 2018-12-19 ENCOUNTER — Encounter: Payer: Self-pay | Admitting: *Deleted

## 2018-12-19 DIAGNOSIS — Z510 Encounter for antineoplastic radiation therapy: Secondary | ICD-10-CM | POA: Diagnosis not present

## 2018-12-19 DIAGNOSIS — C7931 Secondary malignant neoplasm of brain: Secondary | ICD-10-CM

## 2018-12-19 NOTE — Progress Notes (Signed)
Per Dr. Lindi Adie, Palliative care consult placed and verified with [facility]

## 2018-12-19 NOTE — Telephone Encounter (Signed)
Received call from patient stating she is scheduled for a 2nd opinion from Dr. Kathie Dike in [place] 6/1 at 1030am via video. She is scheduled to see Dr. Lindi Adie and receive herceptin 6/1.  Informed her I could move her appointments to 6/2.  She states that her vision seems to be a little better. She is able to write.  Encouraged her to call with any questions or concerns.

## 2018-12-20 ENCOUNTER — Telehealth: Payer: Self-pay

## 2018-12-20 ENCOUNTER — Encounter: Payer: Self-pay | Admitting: Radiation Oncology

## 2018-12-20 ENCOUNTER — Other Ambulatory Visit: Payer: Self-pay

## 2018-12-20 ENCOUNTER — Ambulatory Visit
Admission: RE | Admit: 2018-12-20 | Discharge: 2018-12-20 | Disposition: A | Discharge status: Home / Self Care | Payer: PRIVATE HEALTH INSURANCE | Source: Ambulatory Visit | Attending: Radiation Oncology | Admitting: Radiation Oncology

## 2018-12-20 DIAGNOSIS — Z510 Encounter for antineoplastic radiation therapy: Secondary | ICD-10-CM | POA: Diagnosis not present

## 2018-12-20 MED FILL — TUKYSA 150 MG TABS: 150 | 30 days supply | Qty: 120 | Fill #0

## 2018-12-20 MED FILL — ONDANSETRON HCL 8 MG TABLET: 8 | 10 days supply | Qty: 30 | Fill #0

## 2018-12-20 NOTE — Telephone Encounter (Signed)
Contacted patient's brother, Richardson Landry, to introduce Palliative Care and to offer to schedule a visit with NP. Richardson Landry shared to schedule with caregiver who is in the home Mon-Fri, Ladean Raya [facility]

## 2018-12-20 NOTE — Telephone Encounter (Signed)
Phone call placed to patient's friend/caregiver to schedule visit with Palliative care. VM left

## 2018-12-20 NOTE — Telephone Encounter (Signed)
Oral Oncology Patient Advocate Encounter  Confirmed with [facility] that [facility] and ondansetron was picked up on 12/20/18 with a $0 copay.   [place] [facility] Phone [phone] Fax [phone] 12/20/2018   1:16 PM

## 2018-12-24 ENCOUNTER — Telehealth: Payer: Self-pay | Admitting: Hematology and Oncology

## 2018-12-24 ENCOUNTER — Encounter: Payer: Self-pay | Admitting: Hematology and Oncology

## 2018-12-24 DIAGNOSIS — C50212 Malignant neoplasm of upper-inner quadrant of left female breast: Secondary | ICD-10-CM

## 2018-12-24 MED ORDER — TUCATINIB 150 MG PO TABS: 300.0000 mg | ORAL_TABLET | Freq: Two times a day (BID) | ORAL | 1 refills | Status: DC

## 2018-12-24 MED FILL — CAPECITABINE 500 MG TABS: 500 | 21 days supply | Qty: 56 | Fill #1

## 2018-12-24 NOTE — Assessment & Plan Note (Signed)
Extensive and widespread brain metastases: Patient has extensive visual symptoms are started off fairly mild in January 2020. She started losing peripheral vision and later ability to read. She was also having poor thinking and thought process. She has no motor deficits. Denies any sensory deficits.  Brain MRI 11/27/2018: Extensive widespread brain metastases. Largest tumor in the left occipital lobe 4.5 cm. Several other large tumors noted in multiple areas. Extensive smaller tumors are noted including the cerebellum.  PET/CT 12/05/2018:Widespread hypermetabolic bone metastases throughout the axilla and appendicular skeleton, left femoral neck metastases at risk for pathologic fracture, multiple liver metastases, left adrenal metastases, left supraclavicular lymph node and tiny 3 mm left upper lobe lung nodule  Liver biopsy 12/12/2018: Metastatic breast cancer ER 100%, PR 0%, Ki-67 50%, HER-2 3+ positive ---------------------------------------------------------------------------------------------------------------------- Current treatment: Completed whole brain radiation, palliative radiation to the left femur Xeloda, Herceptin, Tucatinib  Intractable pain in hips: Radiation completed, pain medications Palliative care consulted  I discussed with the patient that the current treatment plan would be to give her 3 months of Xeloda with Herceptin and Tucatinib.  After that we can switch to antiestrogen therapy by discontinuing Xeloda.  We could consider treatment with Ibrance with letrozole along with Herceptin and Tucatinib.  Patient wishes to get a second opinion.  In the interim we will continue with this current treatment plan.

## 2018-12-24 NOTE — Telephone Encounter (Signed)
Moved 6/1 lab and lush to 6/2 per sch msg. Called patient. No answer, left msg.

## 2018-12-26 ENCOUNTER — Telehealth: Payer: Self-pay

## 2018-12-26 ENCOUNTER — Telehealth: Payer: Self-pay | Admitting: *Deleted

## 2018-12-26 NOTE — Telephone Encounter (Signed)
Received return call from patient's friend/caregiver, Inez Catalina. Inez Catalina shared patient's recent diagnosis of metatastic disease and poor prognosis. Patient began a clinical trial yesterday. Inez Catalina is unsure if she should schedule visit with Palliative care. Inez Catalina stated she will call patient's brother, Richardson Landry, about scheduling visit and return call.

## 2018-12-26 NOTE — Telephone Encounter (Signed)
Received call from pt brother Richardson Landry stating pt has been sleeping a lot more and wanted to know if this was from any medication she is taking.  Per Dr. Lindi Adie, pt may be drowsy as a side effect from radiation therapy but it may also be the disease process.  Dr. Lindi Adie suggested that palliative care come to the pts home to assess.  Richardson Landry verbalized understanding and stated that they are working with palliative care now.

## 2018-12-30 ENCOUNTER — Ambulatory Visit: Payer: PRIVATE HEALTH INSURANCE

## 2018-12-30 ENCOUNTER — Ambulatory Visit: Payer: PRIVATE HEALTH INSURANCE | Admitting: Hematology and Oncology

## 2018-12-30 ENCOUNTER — Other Ambulatory Visit: Payer: PRIVATE HEALTH INSURANCE

## 2018-12-30 ENCOUNTER — Other Ambulatory Visit: Payer: Self-pay

## 2018-12-30 DIAGNOSIS — C50212 Malignant neoplasm of upper-inner quadrant of left female breast: Secondary | ICD-10-CM

## 2018-12-30 DIAGNOSIS — Z170 Estrogen receptor positive status [ER+]: Secondary | ICD-10-CM

## 2018-12-30 NOTE — Progress Notes (Signed)
Patient Care Team: [name], MD as PCP - General (Family Medicine) [name], MD as Consulting Physician (General Surgery) Nicholas Lose, MD as Consulting Physician (Hematology and Oncology) Eppie Gibson, MD as Attending Physician (Radiation Oncology) Gardenia Phlegm, NP as Nurse Practitioner (Hematology and Oncology)  DIAGNOSIS:    ICD-10-CM   1. Malignant neoplasm of upper-inner quadrant of left breast in female, estrogen receptor positive ([place]) C50.212 DISCONTINUED: trastuzumab-dkst (OGIVRI) 336 mg in sodium chloride 0.9 % 250 mL chemo infusion   Z17.0     SUMMARY OF ONCOLOGIC HISTORY:   Malignant neoplasm of upper-inner quadrant of left breast in female, estrogen receptor positive ([place])   09/14/2016 Initial Diagnosis    Palpable left breast mass for 2 months, ultrasound at 9:00: 4.7 x 3.6 x 3.3 cm, axilla negative: U/S Bx: Grade 2 IDC ER 95%, PR 50%, HER-2 positive ratio 6.17, Ki-67 20%, T2 N0 stage IIA clinical stage    09/27/2016 Breast MRI    Left breast: 3.5 cm irregular necrotic mass; left axillary lymph node 1.2 cm; right breast 0.6 cm mass at 6:00 position 0.5 cm mass right breast lower inner quadrant     10/11/2016 - 01/24/2017 Neo-Adjuvant Chemotherapy    Taxotere, Carboplatin, Herceptin, Perjeta x 6 cycles followed by Herceptin and Perjeta maintenance for one year    01/26/2017 Breast MRI    Left breast: Irregular enhancing mass measuring 2.7 cm, smaller than previous, no abnormal lymph nodes; right breast: No mass or enhancement    03/05/2017 Surgery    Left lumpectomy: IDC grade 2, 2.5 cm, margins negative, 0/2 lymph nodes negative, ER 100%, PR 50%, HER-2 positive ratio 4.88, T2 N0 stage IB AJCC 8    04/17/2017 Mammogram    2 groups of suspicious residual calcifications in the left breast spanning 4.8 cm one is anterior to lumpectomy the other is medial aspect of lumpectomy    05/21/2017 - 06/15/2017 Radiation Therapy    Adjuvant radiation  therapy    07/11/2017 -  Anti-estrogen oral therapy    Letrozole 2.5 mg daily; Neratinib started 11/28/2017    12/03/2018 - 12/20/2018 Radiation Therapy    Radiation to right hip    12/05/2018 PET scan    Widespread hypermetabolic bone metastases throughout the axilla and appendicular skeleton, left femoral neck metastases at risk for pathologic fracture, multiple liver metastases, left adrenal metastases, left supraclavicular lymph node and tiny 3 mm left upper lobe lung nodule    12/09/2018 -  Chemotherapy    Xeloda, Herceptin, Tucatinib     CHIEF COMPLIANT: Follow-up of metastatic breast cancer for Herceptin treatment  INTERVAL HISTORY: Miranda Woods is a 63 y.o. with above-mentioned history of metastatic breast cancer with extensive brain and systemic metastases. On 12/11/18 she had significant vision issues. Brain MRI showed no evidence of new intracranial metastases or acute infarct. There was a slight decrease in the size of left occipital metastasis with decreased edema and evidence of subependymal spread in the occipital horn in the left lateral ventricle. Size of other metastases was unchanged, with minimal new posterior right temporal edema. Liver biopsy on 12/12/18 showed metastatic carcinoma, HER2 positive, ER 100%, PR negative, Ki67 50%. She is currently receiving palliative care. Radiation to her right hip was completed on 12/20/18. She presents to the clinic today for Herceptin treatment.  She tells me that her vision is slightly better but she cannot drive.  Her family is helping her 97/ 7. She started Tucatinib about a week ago  and appears to be tolerating it well. She is now on her second cycle of Xeloda.  She has been taking it 2 weeks on 1 week off schedule. For the pain she is using narcotic pain medication she get renewal today from Dr. Isidore Moos  REVIEW OF SYSTEMS:   Constitutional: Denies fevers, chills or abnormal weight loss Eyes: Loss of vision Ears, nose, mouth,  throat, and face: Denies mucositis or sore throat Respiratory: Denies cough, dyspnea or wheezes Cardiovascular: Denies palpitation, chest discomfort Gastrointestinal: Denies nausea, heartburn or change in bowel habits Skin: Denies abnormal skin rashes Lymphatics: Denies new lymphadenopathy or easy bruising Neurological: Denies numbness, tingling or new weaknesses Behavioral/Psych: She was feeling very depressed but mood has improved slightly Extremities: No lower extremity edema Breast: denies any pain or lumps or nodules in either breasts All other systems were reviewed with the patient and are negative.  I have reviewed the past medical history, past surgical history, social history and family history with the patient and they are unchanged from previous note.  ALLERGIES:  is allergic to codeine.  MEDICATIONS:  Current Outpatient Medications  Medication Sig Dispense Refill  . Calcium-Magnesium-Zinc (CAL-MAG-ZINC PO) Take 1 tablet by mouth daily.    . capecitabine (XELODA) 500 MG tablet Take 2 tablets (1,000 mg total) by mouth 2 (two) times daily after a meal. 14 days on and 7 days off 56 tablet 6  . Cholecalciferol (VITAMIN D3) 125 MCG (5000 UT) CAPS Take 5,000 Units by mouth daily.    . cyclobenzaprine (FLEXERIL) 5 MG tablet Take 1 tablet (5 mg total) by mouth 3 (three) times daily as needed for muscle spasms. (Patient not taking: Reported on 12/10/2018) 30 tablet 0  . dexamethasone (DECADRON) 4 MG tablet Take 1 tablet (4 mg total) by mouth 2 (two) times daily. 180 tablet 0  . diphenhydrAMINE (BENADRYL) 25 MG tablet Take 50 mg by mouth at bedtime.    . hydroxypropyl methylcellulose / hypromellose (ISOPTO TEARS / GONIOVISC) 2.5 % ophthalmic solution Place 1 drop into both eyes 3 (three) times daily as needed for dry eyes.    Marland Kitchen letrozole (FEMARA) 2.5 MG tablet TAKE 1 TABLET(2.5 MG) BY MOUTH DAILY (Patient not taking: No sig reported) 90 tablet 3  . lidocaine-prilocaine (EMLA) cream Apply  to affected area once (Patient taking differently: Apply 1 application topically daily as needed (pain). Apply to affected area once) 30 g 3  . ondansetron (ZOFRAN) 8 MG tablet Take 0.5-1 tablets (4-8 mg total) by mouth every 8 (eight) hours as needed for nausea or vomiting. 30 tablet 0  . oxyCODONE (ROXICODONE) 5 MG immediate release tablet Take 1 tablet (5 mg total) by mouth every 4 (four) hours as needed for severe pain. 60 tablet 0  . Tucatinib (TUKYSA) 150 MG TABS Take 300 mg by mouth 2 (two) times a day. 120 tablet 1  . Turmeric 500 MG CAPS Take 500 mg by mouth daily.     No current facility-administered medications for this visit.    Facility-Administered Medications Ordered in Other Visits  Medication Dose Route Frequency Provider Last Rate Last Dose  . heparin lock flush 100 unit/mL  500 Units Intracatheter Once PRN Nicholas Lose, MD      . sodium chloride flush (NS) 0.9 % injection 10 mL  10 mL Intravenous PRN Nicholas Lose, MD   10 mL at 01/24/17 1116  . sodium chloride flush (NS) 0.9 % injection 10 mL  10 mL Intravenous PRN Nicholas Lose, MD  10 mL at 02/14/17 1236  . sodium chloride flush (NS) 0.9 % injection 10 mL  10 mL Intracatheter PRN Nicholas Lose, MD      . trastuzumab-dkst (OGIVRI) 378 mg in sodium chloride 0.9 % 250 mL chemo infusion  6 mg/kg (Treatment Plan) Intravenous Once Nicholas Lose, MD        PHYSICAL EXAMINATION: ECOG PERFORMANCE STATUS: 1 - Symptomatic but completely ambulatory  Vitals:   12/31/18 0847  BP: 128/73  Pulse: 75  Resp: 18  Temp: 98.5 F (36.9 C)  SpO2: 99%   Filed Weights   12/31/18 0847  Weight: 136 lb 12.8 oz (62.1 kg)    GENERAL: alert, no distress and comfortable SKIN: skin color, texture, turgor are normal, no rashes or significant lesions EYES: normal, Conjunctiva are pink and non-injected, sclera clear OROPHARYNX: no exudate, no erythema and lips, buccal mucosa, and tongue normal  NECK: supple, thyroid normal size, non-tender,  without nodularity LYMPH: no palpable lymphadenopathy in the cervical, axillary or inguinal LUNGS: clear to auscultation and percussion with normal breathing effort HEART: regular rate & rhythm and no murmurs and no lower extremity edema ABDOMEN: abdomen soft, non-tender and normal bowel sounds MUSCULOSKELETAL: no cyanosis of digits and no clubbing  NEURO: alert & oriented x 3 with fluent speech, no focal motor/sensory deficits EXTREMITIES: No lower extremity edema  LABORATORY DATA:  I have reviewed the data as listed CMP Latest Ref Rng & Units 12/31/2018 11/29/2018 11/29/2018  Glucose 70 - 99 mg/dL 107(H) 139(H) 101(H)  BUN 6 - 20 mg/dL '18 15 17  '$ Creatinine 0.44 - 1.00 mg/dL 0.63 0.49 0.62  Sodium 135 - 145 mmol/L 136 134(L) 142  Potassium 3.5 - 5.1 mmol/L 3.8 3.7 4.1  Chloride 98 - 111 mmol/L 102 101 106  CO2 22 - 32 mmol/L '26 24 25  '$ Calcium 8.9 - 10.3 mg/dL 7.7(L) 9.0 9.3  Total Protein 6.5 - 8.1 g/dL 5.0(L) 6.9 7.6  Total Bilirubin 0.3 - 1.2 mg/dL 0.4 0.5 <0.2(L)  Alkaline Phos 38 - 126 U/L 69 89 108  AST 15 - 41 U/L 13(L) 21 19  ALT 0 - 44 U/L '16 12 10    '$ Lab Results  Component Value Date   WBC 2.4 (L) 12/31/2018   HGB 11.6 (L) 12/31/2018   HCT 34.5 (L) 12/31/2018   MCV 94.5 12/31/2018   PLT 102 (L) 12/31/2018   NEUTROABS 1.8 12/31/2018    ASSESSMENT & PLAN:  Malignant neoplasm of upper-inner quadrant of left breast in female, estrogen receptor positive ([place]) Extensive and widespread brain metastases: Patient has extensive visual symptoms are started off fairly mild in January 2020. She started losing peripheral vision and later ability to read. She was also having poor thinking and thought process. She has no motor deficits. Denies any sensory deficits.  Brain MRI 11/27/2018: Extensive widespread brain metastases. Largest tumor in the left occipital lobe 4.5 cm. Several other large tumors noted in multiple areas. Extensive smaller tumors are noted including the  cerebellum.  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  After that we can switch to antiestrogen therapy by discontinuing Xeloda.  We could consider treatment with Ibrance with letrozole possibly in combination with Herceptin and Tucatinib.  Although there is no data for this combination if she were to tolerate the treatment well that would possibly give her the best chance for response to her aggressive breast cancer.  Patient wishes to get a second opinion.  In the interim we will continue with this current treatment plan.  She will be seeing oncologist at [facility].  Return to clinic in 3 weeks for next Herceptin.  No orders of the defined types were placed in this encounter.  The patient has a good understanding of the overall plan. she agrees with it. she will call with any problems that may develop before the next visit here.  Nicholas Lose, MD 12/31/2018  Julious Oka Dorshimer am acting as scribe for Dr. Nicholas Lose.  I have reviewed the above documentation for accuracy and completeness, and I agree  with the above.

## 2018-12-31 ENCOUNTER — Other Ambulatory Visit: Payer: Self-pay

## 2018-12-31 ENCOUNTER — Inpatient Hospital Stay: Payer: PRIVATE HEALTH INSURANCE

## 2018-12-31 ENCOUNTER — Inpatient Hospital Stay: Payer: PRIVATE HEALTH INSURANCE | Attending: Hematology and Oncology

## 2018-12-31 ENCOUNTER — Inpatient Hospital Stay (HOSPITAL_BASED_OUTPATIENT_CLINIC_OR_DEPARTMENT_OTHER): Payer: PRIVATE HEALTH INSURANCE | Admitting: Hematology and Oncology

## 2018-12-31 ENCOUNTER — Encounter: Payer: Self-pay | Admitting: General Practice

## 2018-12-31 ENCOUNTER — Encounter (HOSPITAL_COMMUNITY): Payer: Self-pay | Admitting: Hematology and Oncology

## 2018-12-31 ENCOUNTER — Other Ambulatory Visit: Payer: Self-pay | Admitting: Radiation Oncology

## 2018-12-31 ENCOUNTER — Other Ambulatory Visit: Payer: Self-pay | Admitting: Hematology and Oncology

## 2018-12-31 ENCOUNTER — Telehealth: Payer: Self-pay

## 2018-12-31 DIAGNOSIS — C50212 Malignant neoplasm of upper-inner quadrant of left female breast: Secondary | ICD-10-CM

## 2018-12-31 DIAGNOSIS — Z5112 Encounter for antineoplastic immunotherapy: Secondary | ICD-10-CM | POA: Diagnosis not present

## 2018-12-31 DIAGNOSIS — C7972 Secondary malignant neoplasm of left adrenal gland: Secondary | ICD-10-CM | POA: Insufficient documentation

## 2018-12-31 DIAGNOSIS — C787 Secondary malignant neoplasm of liver and intrahepatic bile duct: Secondary | ICD-10-CM | POA: Insufficient documentation

## 2018-12-31 DIAGNOSIS — C7931 Secondary malignant neoplasm of brain: Secondary | ICD-10-CM

## 2018-12-31 DIAGNOSIS — C770 Secondary and unspecified malignant neoplasm of lymph nodes of head, face and neck: Secondary | ICD-10-CM

## 2018-12-31 DIAGNOSIS — Z170 Estrogen receptor positive status [ER+]: Secondary | ICD-10-CM | POA: Insufficient documentation

## 2018-12-31 DIAGNOSIS — C7951 Secondary malignant neoplasm of bone: Secondary | ICD-10-CM | POA: Insufficient documentation

## 2018-12-31 DIAGNOSIS — R112 Nausea with vomiting, unspecified: Secondary | ICD-10-CM | POA: Diagnosis not present

## 2018-12-31 DIAGNOSIS — Z7189 Other specified counseling: Secondary | ICD-10-CM

## 2018-12-31 DIAGNOSIS — Z95828 Presence of other vascular implants and grafts: Secondary | ICD-10-CM

## 2018-12-31 LAB — CBC WITH DIFFERENTIAL (CANCER CENTER ONLY)
Abs Immature Granulocytes: 0.06 10*3/uL (ref 0.00–0.07)
Basophils Absolute: 0 10*3/uL (ref 0.0–0.1)
Basophils Relative: 0 %
Eosinophils Absolute: 0.1 10*3/uL (ref 0.0–0.5)
Eosinophils Relative: 3 %
HCT: 34.5 % — ABNORMAL LOW (ref 36.0–46.0)
Hemoglobin: 11.6 g/dL — ABNORMAL LOW (ref 12.0–15.0)
Immature Granulocytes: 3 %
Lymphocytes Relative: 8 %
Lymphs Abs: 0.2 10*3/uL — ABNORMAL LOW (ref 0.7–4.0)
MCH: 31.8 pg (ref 26.0–34.0)
MCHC: 33.6 g/dL (ref 30.0–36.0)
MCV: 94.5 fL (ref 80.0–100.0)
Monocytes Absolute: 0.3 10*3/uL (ref 0.1–1.0)
Monocytes Relative: 12 %
Neutro Abs: 1.8 10*3/uL (ref 1.7–7.7)
Neutrophils Relative %: 74 %
Platelet Count: 102 10*3/uL — ABNORMAL LOW (ref 150–400)
RBC: 3.65 MIL/uL — ABNORMAL LOW (ref 3.87–5.11)
RDW: 14.5 % (ref 11.5–15.5)
WBC Count: 2.4 10*3/uL — ABNORMAL LOW (ref 4.0–10.5)
nRBC: 0.8 % — ABNORMAL HIGH (ref 0.0–0.2)

## 2018-12-31 LAB — CMP (CANCER CENTER ONLY)
ALT: 16 U/L (ref 0–44)
AST: 13 U/L — ABNORMAL LOW (ref 15–41)
Albumin: 2.8 g/dL — ABNORMAL LOW (ref 3.5–5.0)
Alkaline Phosphatase: 69 U/L (ref 38–126)
Anion gap: 8 (ref 5–15)
BUN: 18 mg/dL (ref 6–20)
CO2: 26 mmol/L (ref 22–32)
Calcium: 7.7 mg/dL — ABNORMAL LOW (ref 8.9–10.3)
Chloride: 102 mmol/L (ref 98–111)
Creatinine: 0.63 mg/dL (ref 0.44–1.00)
GFR, Est AFR Am: >60 mL/min (ref >60)
GFR, Estimated: >60 mL/min (ref >60)
Glucose, Bld: 107 mg/dL — ABNORMAL HIGH (ref 70–99)
Potassium: 3.8 mmol/L (ref 3.5–5.1)
Sodium: 136 mmol/L (ref 135–145)
Total Bilirubin: 0.4 mg/dL (ref 0.3–1.2)
Total Protein: 5 g/dL — ABNORMAL LOW (ref 6.5–8.1)

## 2018-12-31 MED ORDER — SODIUM CHLORIDE 0.9 % IV SOLN
Freq: Once | INTRAVENOUS | Status: AC
  Administered 2018-12-31: 10:00:00 via INTRAVENOUS
  Filled 2018-12-31: qty 250

## 2018-12-31 MED ORDER — SODIUM CHLORIDE 0.9% FLUSH
10.0000 mL | INTRAVENOUS | Status: DC | PRN
  Administered 2018-12-31: 10 mL
  Filled 2018-12-31: qty 10

## 2018-12-31 MED ORDER — HEPARIN SOD (PORK) LOCK FLUSH 100 UNIT/ML IV SOLN
500.0000 [IU] | Freq: Once | INTRAVENOUS | Status: AC | PRN
  Administered 2018-12-31: 500 [IU]
  Filled 2018-12-31: qty 5

## 2018-12-31 MED ORDER — TRASTUZUMAB-DKST CHEMO 150 MG IV SOLR
6.0000 mg/kg | Freq: Once | INTRAVENOUS | Status: AC
  Administered 2018-12-31: 378 mg via INTRAVENOUS
  Filled 2018-12-31: qty 18

## 2018-12-31 MED ORDER — DIPHENHYDRAMINE HCL 25 MG PO CAPS
ORAL_CAPSULE | ORAL | Status: AC
  Filled 2018-12-31: qty 2

## 2018-12-31 MED ORDER — OXYCODONE HCL 5 MG PO TABS: 5.0000 mg | ORAL_TABLET | ORAL | 0 refills | Status: DC | PRN

## 2018-12-31 MED ORDER — ACETAMINOPHEN 325 MG PO TABS
ORAL_TABLET | ORAL | Status: AC
  Filled 2018-12-31: qty 2

## 2018-12-31 MED ORDER — SODIUM CHLORIDE 0.9% FLUSH
10.0000 mL | INTRAVENOUS | Status: DC | PRN
  Administered 2018-12-31: 10 mL via INTRAVENOUS
  Filled 2018-12-31: qty 10

## 2018-12-31 MED ORDER — ACETAMINOPHEN 325 MG PO TABS
650.0000 mg | ORAL_TABLET | Freq: Once | ORAL | Status: AC
  Administered 2018-12-31: 650 mg via ORAL

## 2018-12-31 MED ORDER — DIPHENHYDRAMINE HCL 25 MG PO CAPS
50.0000 mg | ORAL_CAPSULE | Freq: Once | ORAL | Status: AC
  Administered 2018-12-31: 50 mg via ORAL

## 2018-12-31 NOTE — Progress Notes (Signed)
[facility] CSW Progress Notes  Email from [facility] - they have spoken with patient, clarified that they are mailing paperwork to enroll w them for help in obtaining disability.  They had mailed paperwork previously, but it had not been returned.  CSW spoke w patient and brother to discuss process of linking w [employer] to get their help in filing for [place] is reaching out to patient to obtain necessary paperwork to work on her behalf w filing.     Edwyna Shell, LCSW Clinical Social Worker Phone:  [phone]

## 2018-12-31 NOTE — Progress Notes (Signed)
Zometa not given today.  Unclear if PA completed and borderline serum Calcium level. I have notified PA dept to check on PA.  Also notified MD. Kennith Center, Pharm.D., CPP 12/31/2018@12 :45 PM

## 2018-12-31 NOTE — Patient Instructions (Signed)
[facility] Discharge Instructions for Patients Receiving Chemotherapy  Today you received the following chemotherapy agents: Herceptin  To help prevent nausea and vomiting after your treatment, we encourage you to take your nausea medication as directed.   If you develop nausea and vomiting that is not controlled by your nausea medication, call the clinic.   BELOW ARE SYMPTOMS THAT SHOULD BE REPORTED IMMEDIATELY:  *FEVER GREATER THAN 100.5 F  *CHILLS WITH OR WITHOUT FEVER  NAUSEA AND VOMITING THAT IS NOT CONTROLLED WITH YOUR NAUSEA MEDICATION  *UNUSUAL SHORTNESS OF BREATH  *UNUSUAL BRUISING OR BLEEDING  TENDERNESS IN MOUTH AND THROAT WITH OR WITHOUT PRESENCE OF ULCERS  *URINARY PROBLEMS  *BOWEL PROBLEMS  UNUSUAL RASH Items with * indicate a potential emergency and should be followed up as soon as possible.  Feel free to call the clinic should you have any questions or concerns. The clinic phone number is [phone].  Please show the CHEMO ALERT CARD at check-in to the Emergency Department and triage nurse.  Coronavirus (COVID-19) Are you at risk?  Are you at risk for the Coronavirus (COVID-19)?  To be considered HIGH RISK for Coronavirus (COVID-19), you have to meet the following criteria:  . Traveled to [place], [place], [place], [place] or [place]; or in the [place] to [place], [place], [place], or [place]; and have fever, cough, and shortness of breath within the last 2 weeks of travel OR . Been in close contact with a person diagnosed with COVID-19 within the last 2 weeks and have fever, cough, and shortness of breath . IF YOU DO NOT MEET THESE CRITERIA, YOU ARE CONSIDERED LOW RISK FOR COVID-19.  What to do if you are HIGH RISK for COVID-19?  . If you are having a medical emergency, call 911. . Seek medical care right away. Before you go to a doctor's office, urgent care or emergency department, call ahead and tell them about your  recent travel, contact with someone diagnosed with COVID-19, and your symptoms. You should receive instructions from your physician's office regarding next steps of care.  . When you arrive at healthcare provider, tell the healthcare staff immediately you have returned from visiting [place], [place], [place], [place] or [place]; or traveled in the [place] to [place], [place], [place], or [place]; in the last two weeks or you have been in close contact with a person diagnosed with COVID-19 in the last 2 weeks.   . Tell the health care staff about your symptoms: fever, cough and shortness of breath. . After you have been seen by a medical provider, you will be either: o Tested for (COVID-19) and discharged home on quarantine except to seek medical care if symptoms worsen, and asked to  - Stay home and avoid contact with others until you get your results (4-5 days)  - Avoid travel on public transportation if possible (such as bus, train, or airplane) or o Sent to the Emergency Department by EMS for evaluation, COVID-19 testing, and possible admission depending on your condition and test results.  What to do if you are LOW RISK for COVID-19?  Reduce your risk of any infection by using the same precautions used for avoiding the common cold or flu:  . Wash your hands often with soap and warm water for at least 20 seconds.  If soap and water are not readily available, use an alcohol-based hand sanitizer with at least 60% alcohol.  . If coughing or sneezing,   cover your mouth and nose by coughing or sneezing into the elbow areas of your shirt or coat, into a tissue or into your sleeve (not your hands). . Avoid shaking hands with others and consider head nods or verbal greetings only. . Avoid touching your eyes, nose, or mouth with unwashed hands.  . Avoid close contact with people who are sick. . Avoid places or events with large numbers of people in one location, like concerts or sporting  events. . Carefully consider travel plans you have or are making. . If you are planning any travel outside or inside the [place], visit the CDC's Travelers' Health webpage for the latest health notices. . If you have some symptoms but not all symptoms, continue to monitor at home and seek medical attention if your symptoms worsen. . If you are having a medical emergency, call 911.   ADDITIONAL HEALTHCARE OPTIONS FOR PATIENTS  [place] [facility] / e-Visit: [email or web address]         [facility]: [phone]  [facility]: [phone]                   [facility]: [phone]     

## 2018-12-31 NOTE — Progress Notes (Signed)
  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  Cancer Staging Malignant neoplasm of upper-inner quadrant of left breast in female, estrogen receptor positive ([place]) Staging form: Breast, AJCC 8th Edition - Clinical stage from 09/20/2016: Stage IB (cT2, cN0, cM0, G2, ER: Positive, PR: Positive, HER2: Positive) - Unsigned  Intent: Palliative  Radiation Treatment Dates: 12/02/2018 through 12/20/2018 Site Technique Total Dose Dose per Fx Completed Fx Beam Energies  Pelvis: Pelvis_Lt_hip 3D 30/30 3 10/10 10X  Spine: Spine_L_S 3D 30/30 3 10/10 10X  Brain: Brain_whole Complex 35/35 2.5 14/14 6X   Narrative: The patient tolerated radiation therapy relatively well. Her pain in the hips and lower back is controlled. Over the course of treatment, she experienced moderate fatigue and developed skin irritation on her forehead with some hair loss noted.  Plan: The patient will follow-up with radiation oncology in one month.  ________________________________________________  Eppie Gibson, MD  This document serves as a record of services personally performed by Eppie Gibson, MD. It was created on her behalf by Rae Lips, a trained medical scribe. The creation of this record is based on the scribe's personal observations and the provider's statements to them. This document has been checked and approved by the attending provider.

## 2018-12-31 NOTE — Telephone Encounter (Signed)
Phone call placed to patient's caregiver/friend, Inez Catalina, to follow up regarding scheduling with Palliative care. Inez Catalina to contact patient's brother Richardson Landry and return call.

## 2018-12-31 NOTE — Patient Instructions (Signed)

## 2019-01-01 ENCOUNTER — Encounter: Payer: Self-pay | Admitting: General Practice

## 2019-01-01 ENCOUNTER — Telehealth: Payer: Self-pay | Admitting: Hematology and Oncology

## 2019-01-01 NOTE — Progress Notes (Signed)
[facility] CSW Progress Notes  Call to patient to make sure she has connected [place] for disability application - VM left w my contact information.  Per email from [facility], they have mailed required preliminary documents again to patient.  Edwyna Shell, LCSW Clinical Social Worker Phone:  [phone]

## 2019-01-01 NOTE — Telephone Encounter (Signed)
I left a message regarding schedule  

## 2019-01-02 ENCOUNTER — Ambulatory Visit: Payer: Self-pay | Admitting: Hematology and Oncology

## 2019-01-14 ENCOUNTER — Encounter: Payer: Self-pay | Admitting: General Practice

## 2019-01-14 NOTE — Assessment & Plan Note (Signed)
Extensive and widespread brain metastases: Patient has extensive visual symptoms are started off fairly mild in January 2020. She started losing peripheral vision and later ability to read. She was also having poor thinking and thought process. She has no motor deficits. Denies any sensory deficits.  Brain MRI 11/27/2018: Extensive widespread brain metastases. Largest tumor in the left occipital lobe 4.5 cm. Several other large tumors noted in multiple areas. Extensive smaller tumors are noted including the cerebellum.  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 Return to clinic in 3 weeks for next Herceptin

## 2019-01-14 NOTE — Progress Notes (Signed)
[place] CSW Progress Notes  Email from [employer], agency is helping patient file for Social Security Disability.  States they have been in contact w patient's friend/caregiver, Danielle Rankin, who is concerned about patient and says and "she is not doing well at all."  Requesting letter from providers to allow assistance from friend/caregiver, "in case she (patient) is not able to speak for herself."  Requested further clarification from agency and will communicate w treatment team re request when clarified.  Edwyna Shell, LCSW Clinical Social Worker Phone:  [phone]

## 2019-01-16 MED FILL — CAPECITABINE 500 MG TABS: 500 | 21 days supply | Qty: 56 | Fill #2

## 2019-01-16 MED FILL — TUKYSA 150 MG TABS: 150 | 30 days supply | Qty: 120 | Fill #1

## 2019-01-17 ENCOUNTER — Telehealth: Payer: Self-pay | Admitting: Adult Health Nurse Practitioner

## 2019-01-17 ENCOUNTER — Telehealth: Payer: Self-pay

## 2019-01-17 ENCOUNTER — Encounter: Payer: Self-pay | Admitting: General Practice

## 2019-01-17 NOTE — Progress Notes (Signed)
[place] CSW Progress Notes  MD letter sent to [facility] allowing patient to have friend/family member assist w disability application.  Sent by secure scan email by CSW [place].  Edwyna Shell, LCSW Clinical Social Worker Phone:  [phone]

## 2019-01-17 NOTE — Telephone Encounter (Signed)
Contacted patient to follow-up with her to see if she wanted to pursue Palliative services, patient stated that she didn't feel this was necessary at this time.  I explained to her that I would cancel the referral and if she changes her mind to contact PCP office and request Palliative services again.  She was in agreement with this.

## 2019-01-17 NOTE — Telephone Encounter (Signed)
Letter from Dr Lindi Adie faxed to [facility] per Edwyna Shell, [place].

## 2019-01-18 NOTE — Progress Notes (Signed)
Patient Care Team: [name], MD as PCP - General (Family Medicine) [name], MD as Consulting Physician (General Surgery) Nicholas Lose, MD as Consulting Physician (Hematology and Oncology) Eppie Gibson, MD as Attending Physician (Radiation Oncology) Gardenia Phlegm, NP as Nurse Practitioner (Hematology and Oncology)  DIAGNOSIS:    ICD-10-CM   1. Malignant neoplasm of upper-inner quadrant of left breast in female, estrogen receptor positive ([place])  C50.212    Z17.0     SUMMARY OF ONCOLOGIC HISTORY: Oncology History  Malignant neoplasm of upper-inner quadrant of left breast in female, estrogen receptor positive ([place])  09/14/2016 Initial Diagnosis   Palpable left breast mass for 2 months, ultrasound at 9:00: 4.7 x 3.6 x 3.3 cm, axilla negative: U/S Bx: Grade 2 IDC ER 95%, PR 50%, HER-2 positive ratio 6.17, Ki-67 20%, T2 N0 stage IIA clinical stage   09/27/2016 Breast MRI   Left breast: 3.5 cm irregular necrotic mass; left axillary lymph node 1.2 cm; right breast 0.6 cm mass at 6:00 position 0.5 cm mass right breast lower inner quadrant    10/11/2016 - 01/24/2017 Neo-Adjuvant Chemotherapy   Taxotere, Carboplatin, Herceptin, Perjeta x 6 cycles followed by Herceptin and Perjeta maintenance for one year   01/26/2017 Breast MRI   Left breast: Irregular enhancing mass measuring 2.7 cm, smaller than previous, no abnormal lymph nodes; right breast: No mass or enhancement   03/05/2017 Surgery   Left lumpectomy: IDC grade 2, 2.5 cm, margins negative, 0/2 lymph nodes negative, ER 100%, PR 50%, HER-2 positive ratio 4.88, T2 N0 stage IB AJCC 8   04/17/2017 Mammogram   2 groups of suspicious residual calcifications in the left breast spanning 4.8 cm one is anterior to lumpectomy the other is medial aspect of lumpectomy   05/21/2017 - 06/15/2017 Radiation Therapy   Adjuvant radiation therapy   07/11/2017 -  Anti-estrogen oral therapy   Letrozole 2.5 mg daily; Neratinib  started 11/28/2017   12/03/2018 - 12/20/2018 Radiation Therapy   Radiation to right hip   12/05/2018 PET scan   Widespread hypermetabolic bone metastases throughout the axilla and appendicular skeleton, left femoral neck metastases at risk for pathologic fracture, multiple liver metastases, left adrenal metastases, left supraclavicular lymph node and tiny 3 mm left upper lobe lung nodule   12/09/2018 -  Chemotherapy   Xeloda, Herceptin, Tucatinib   12/31/2018 Miscellaneous   Caris molecular testing: ER +95%, HER-2 positive, PR +25%, AR +85%, BRCA negative, PD-L1 +1%,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 with above-mentioned history of metastatic breast cancer with extensive brain and systemic metastases. She was seen at [facility] on 01/09/19 by Dr. Esaw Dace for a second opinion. She was eligible for 3 clinical trials and requested more information about the [facility] trial. She is currently on Xeloda, 2 weeks on 1 week off, and Herceptin with Tucatinib every 3 weeks. She presents to the clinic today for treatment.   REVIEW OF SYSTEMS:   Constitutional: Denies fevers, chills or abnormal weight loss Eyes: Denies blurriness of vision Ears, nose, mouth, throat, and face: Denies mucositis or sore throat Respiratory: Denies cough, dyspnea or wheezes Cardiovascular: Denies palpitation, chest discomfort Gastrointestinal: Denies nausea, heartburn or change in bowel habits Skin: Denies abnormal skin rashes Lymphatics: Denies new lymphadenopathy or easy bruising Neurological: Denies numbness, tingling or new weaknesses Behavioral/Psych: Mood is stable, no  new changes  Extremities: No lower extremity edema Breast: denies any pain or lumps or nodules in either breasts All other systems were reviewed with the patient and are negative.  I have  reviewed the past medical history, past surgical history, social history and family history with the patient and they are unchanged from previous note.  ALLERGIES:  is allergic to codeine.  MEDICATIONS:  Current Outpatient Medications  Medication Sig Dispense Refill  . Calcium-Magnesium-Zinc (CAL-MAG-ZINC PO) Take 1 tablet by mouth daily.    . capecitabine (XELODA) 500 MG tablet Take 2 tablets (1,000 mg total) by mouth 2 (two) times daily after a meal. 14 days on and 7 days off 56 tablet 6  . Cholecalciferol (VITAMIN D3) 125 MCG (5000 UT) CAPS Take 5,000 Units by mouth daily.    . cyclobenzaprine (FLEXERIL) 5 MG tablet Take 1 tablet (5 mg total) by mouth 3 (three) times daily as needed for muscle spasms. (Patient not taking: Reported on 12/10/2018) 30 tablet 0  . dexamethasone (DECADRON) 4 MG tablet Take 1 tablet (4 mg total) by mouth 2 (two) times daily. 180 tablet 0  . diphenhydrAMINE (BENADRYL) 25 MG tablet Take 50 mg by mouth at bedtime.    . hydroxypropyl methylcellulose / hypromellose (ISOPTO TEARS / GONIOVISC) 2.5 % ophthalmic solution Place 1 drop into both eyes 3 (three) times daily as needed for dry eyes.    Marland Kitchen letrozole (FEMARA) 2.5 MG tablet TAKE 1 TABLET(2.5 MG) BY MOUTH DAILY (Patient not taking: No sig reported) 90 tablet 3  . lidocaine-prilocaine (EMLA) cream Apply to affected area once (Patient taking differently: Apply 1 application topically daily as needed (pain). Apply to affected area once) 30 g 3  . ondansetron (ZOFRAN) 8 MG tablet Take 0.5-1 tablets (4-8 mg total) by mouth every 8 (eight) hours as needed for nausea or vomiting. 30 tablet 0  . oxyCODONE (ROXICODONE) 5 MG immediate release tablet Take 1 tablet (5 mg total) by mouth every 4 (four) hours as needed for severe pain. 60 tablet 0  . Tucatinib (TUKYSA) 150 MG TABS Take 300 mg by mouth 2 (two) times a day. 120 tablet 1  . Turmeric 500 MG CAPS Take 500 mg by mouth daily.     No current facility-administered  medications for this visit.    Facility-Administered Medications Ordered in Other Visits  Medication Dose Route Frequency Provider Last Rate Last Dose  . sodium chloride flush (NS) 0.9 % injection 10 mL  10 mL Intravenous PRN Nicholas Lose, MD   10 mL at 01/24/17 1116  . sodium chloride flush (NS) 0.9 % injection 10 mL  10 mL Intravenous PRN Nicholas Lose, MD   10 mL at 02/14/17 1236    PHYSICAL EXAMINATION: ECOG PERFORMANCE STATUS: 2 - Symptomatic, <50% confined to bed  Vitals:   01/20/19 0920  BP: 126/79  Pulse: 86  Resp: 18  Temp: 98.9 F (37.2 C)  SpO2: 97%   Filed Weights   01/20/19 0920  Weight: 132 lb 3.2 oz (60 kg)    GENERAL: alert, no distress and comfortable SKIN: skin color, texture, turgor are normal, no rashes or significant lesions EYES: normal, Conjunctiva are pink and non-injected, sclera clear OROPHARYNX: no exudate, no erythema and lips, buccal mucosa, and tongue normal  NECK: supple, thyroid normal size, non-tender, without nodularity LYMPH: no palpable lymphadenopathy in the cervical, axillary or inguinal LUNGS: clear to auscultation and percussion with normal breathing effort HEART: regular rate & rhythm and no murmurs and no  lower extremity edema ABDOMEN: abdomen soft, non-tender and normal bowel sounds MUSCULOSKELETAL: no cyanosis of digits and no clubbing  NEURO: alert & oriented x 3 with fluent speech, no focal motor/sensory deficits EXTREMITIES: No lower extremity edema  LABORATORY DATA:  I have reviewed the data as listed CMP Latest Ref Rng & Units 12/31/2018 11/29/2018 11/29/2018  Glucose 70 - 99 mg/dL 107(H) 139(H) 101(H)  BUN 6 - 20 mg/dL '18 15 17  '$ Creatinine 0.44 - 1.00 mg/dL 0.63 0.49 0.62  Sodium 135 - 145 mmol/L 136 134(L) 142  Potassium 3.5 - 5.1 mmol/L 3.8 3.7 4.1  Chloride 98 - 111 mmol/L 102 101 106  CO2 22 - 32 mmol/L '26 24 25  '$ Calcium 8.9 - 10.3 mg/dL 7.7(L) 9.0 9.3  Total Protein 6.5 - 8.1 g/dL 5.0(L) 6.9 7.6  Total Bilirubin 0.3 -  1.2 mg/dL 0.4 0.5 <0.2(L)  Alkaline Phos 38 - 126 U/L 69 89 108  AST 15 - 41 U/L 13(L) 21 19  ALT 0 - 44 U/L '16 12 10    '$ Lab Results  Component Value Date   WBC 2.7 (L) 01/20/2019   HGB 10.0 (L) 01/20/2019   HCT 29.9 (L) 01/20/2019   MCV 96.5 01/20/2019   PLT 184 01/20/2019   NEUTROABS 2.1 01/20/2019    ASSESSMENT & PLAN:  Malignant neoplasm of upper-inner quadrant of left breast in female, estrogen receptor positive ([place]) Extensive and widespread brain metastases: Patient has extensive visual symptoms are started off fairly mild in January 2020. She started losing peripheral vision and later ability to read. She was also having poor thinking and thought process. She has no motor deficits. Denies any sensory deficits.  Brain MRI 11/27/2018: Extensive widespread brain metastases. Largest tumor in the left occipital lobe 4.5 cm. Several other large tumors noted in multiple areas. Extensive smaller tumors are noted including the cerebellum.  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 If the  scans after 3 months reveal that her disease is under good control then we can make a decision on stopping Xeloda or reducing the dosage.  Patient has been thinking about going to [place] to spend time with some friends. Return to clinic in 3 weeks for next Herceptin    No orders of the defined types were placed in this encounter.  The patient has a good understanding of the overall plan. she agrees with it. she will call with any problems that may develop before the next visit here.  Nicholas Lose, MD 01/20/2019  Julious Oka Dorshimer am acting as scribe for Dr. Nicholas Lose.  I have reviewed the above documentation for accuracy and completeness, and I agree with the above.

## 2019-01-20 ENCOUNTER — Encounter: Payer: Self-pay | Admitting: *Deleted

## 2019-01-20 ENCOUNTER — Inpatient Hospital Stay: Payer: PRIVATE HEALTH INSURANCE

## 2019-01-20 ENCOUNTER — Inpatient Hospital Stay (HOSPITAL_BASED_OUTPATIENT_CLINIC_OR_DEPARTMENT_OTHER): Payer: PRIVATE HEALTH INSURANCE | Admitting: Hematology and Oncology

## 2019-01-20 ENCOUNTER — Ambulatory Visit: Payer: PRIVATE HEALTH INSURANCE

## 2019-01-20 ENCOUNTER — Other Ambulatory Visit: Payer: Self-pay

## 2019-01-20 DIAGNOSIS — Z5112 Encounter for antineoplastic immunotherapy: Secondary | ICD-10-CM | POA: Diagnosis not present

## 2019-01-20 DIAGNOSIS — C50212 Malignant neoplasm of upper-inner quadrant of left female breast: Secondary | ICD-10-CM

## 2019-01-20 DIAGNOSIS — Z95828 Presence of other vascular implants and grafts: Secondary | ICD-10-CM

## 2019-01-20 DIAGNOSIS — Z170 Estrogen receptor positive status [ER+]: Secondary | ICD-10-CM

## 2019-01-20 DIAGNOSIS — C7931 Secondary malignant neoplasm of brain: Secondary | ICD-10-CM

## 2019-01-20 DIAGNOSIS — C7951 Secondary malignant neoplasm of bone: Secondary | ICD-10-CM

## 2019-01-20 DIAGNOSIS — Z7189 Other specified counseling: Secondary | ICD-10-CM

## 2019-01-20 DIAGNOSIS — C787 Secondary malignant neoplasm of liver and intrahepatic bile duct: Secondary | ICD-10-CM | POA: Diagnosis not present

## 2019-01-20 DIAGNOSIS — C7972 Secondary malignant neoplasm of left adrenal gland: Secondary | ICD-10-CM

## 2019-01-20 LAB — CBC WITH DIFFERENTIAL (CANCER CENTER ONLY)
Abs Immature Granulocytes: 0.02 10*3/uL (ref 0.00–0.07)
Basophils Absolute: 0 10*3/uL (ref 0.0–0.1)
Basophils Relative: 1 %
Eosinophils Absolute: 0 10*3/uL (ref 0.0–0.5)
Eosinophils Relative: 2 %
HCT: 29.9 % — ABNORMAL LOW (ref 36.0–46.0)
Hemoglobin: 10 g/dL — ABNORMAL LOW (ref 12.0–15.0)
Immature Granulocytes: 1 %
Lymphocytes Relative: 7 %
Lymphs Abs: 0.2 10*3/uL — ABNORMAL LOW (ref 0.7–4.0)
MCH: 32.3 pg (ref 26.0–34.0)
MCHC: 33.4 g/dL (ref 30.0–36.0)
MCV: 96.5 fL (ref 80.0–100.0)
Monocytes Absolute: 0.3 10*3/uL (ref 0.1–1.0)
Monocytes Relative: 10 %
Neutro Abs: 2.1 10*3/uL (ref 1.7–7.7)
Neutrophils Relative %: 79 %
Platelet Count: 184 10*3/uL (ref 150–400)
RBC: 3.1 MIL/uL — ABNORMAL LOW (ref 3.87–5.11)
RDW: 16 % — ABNORMAL HIGH (ref 11.5–15.5)
WBC Count: 2.7 10*3/uL — ABNORMAL LOW (ref 4.0–10.5)
nRBC: 0 % (ref 0.0–0.2)

## 2019-01-20 LAB — CMP (CANCER CENTER ONLY)
ALT: 16 U/L (ref 0–44)
AST: 17 U/L (ref 15–41)
Albumin: 2.8 g/dL — ABNORMAL LOW (ref 3.5–5.0)
Alkaline Phosphatase: 89 U/L (ref 38–126)
Anion gap: 9 (ref 5–15)
BUN: 7 mg/dL (ref 6–20)
CO2: 25 mmol/L (ref 22–32)
Calcium: 8.6 mg/dL — ABNORMAL LOW (ref 8.9–10.3)
Chloride: 101 mmol/L (ref 98–111)
Creatinine: 0.67 mg/dL (ref 0.44–1.00)
GFR, Est AFR Am: >60 mL/min (ref >60)
GFR, Estimated: >60 mL/min (ref >60)
Glucose, Bld: 91 mg/dL (ref 70–99)
Potassium: 3.9 mmol/L (ref 3.5–5.1)
Sodium: 135 mmol/L (ref 135–145)
Total Bilirubin: 0.6 mg/dL (ref 0.3–1.2)
Total Protein: 6.1 g/dL — ABNORMAL LOW (ref 6.5–8.1)

## 2019-01-20 MED ORDER — ACETAMINOPHEN 325 MG PO TABS
650.0000 mg | ORAL_TABLET | Freq: Once | ORAL | Status: AC
  Administered 2019-01-20: 650 mg via ORAL

## 2019-01-20 MED ORDER — OXYCODONE HCL 5 MG PO TABS: 5.0000 mg | ORAL_TABLET | ORAL | 0 refills | Status: DC | PRN

## 2019-01-20 MED ORDER — SODIUM CHLORIDE 0.9% FLUSH
10.0000 mL | INTRAVENOUS | Status: DC | PRN
  Administered 2019-01-20: 10 mL
  Filled 2019-01-20: qty 10

## 2019-01-20 MED ORDER — ZOLEDRONIC ACID 4 MG/100ML IV SOLN
4.0000 mg | Freq: Once | INTRAVENOUS | Status: AC
  Administered 2019-01-20: 4 mg via INTRAVENOUS
  Filled 2019-01-20: qty 100

## 2019-01-20 MED ORDER — DIPHENHYDRAMINE HCL 25 MG PO CAPS
50.0000 mg | ORAL_CAPSULE | Freq: Once | ORAL | Status: AC
  Administered 2019-01-20: 50 mg via ORAL

## 2019-01-20 MED ORDER — ONDANSETRON HCL 8 MG PO TABS: 4.0000 mg | ORAL_TABLET | Freq: Three times a day (TID) | ORAL | 6 refills | Status: AC | PRN

## 2019-01-20 MED ORDER — ACETAMINOPHEN 325 MG PO TABS
ORAL_TABLET | ORAL | Status: AC
  Filled 2019-01-20: qty 2

## 2019-01-20 MED ORDER — SODIUM CHLORIDE 0.9 % IV SOLN
Freq: Once | INTRAVENOUS | Status: AC
  Administered 2019-01-20: 11:00:00 via INTRAVENOUS
  Filled 2019-01-20: qty 250

## 2019-01-20 MED ORDER — SODIUM CHLORIDE 0.9% FLUSH
10.0000 mL | Freq: Once | INTRAVENOUS | Status: AC | PRN
  Administered 2019-01-20: 10 mL
  Filled 2019-01-20: qty 10

## 2019-01-20 MED ORDER — TRASTUZUMAB-DKST CHEMO 150 MG IV SOLR
6.0000 mg/kg | Freq: Once | INTRAVENOUS | Status: AC
  Administered 2019-01-20: 378 mg via INTRAVENOUS
  Filled 2019-01-20: qty 18

## 2019-01-20 MED ORDER — HEPARIN SOD (PORK) LOCK FLUSH 100 UNIT/ML IV SOLN
500.0000 [IU] | Freq: Once | INTRAVENOUS | Status: AC | PRN
  Administered 2019-01-20: 500 [IU]
  Filled 2019-01-20: qty 5

## 2019-01-20 MED ORDER — ONDANSETRON HCL 8 MG PO TABS
8.0000 mg | ORAL_TABLET | Freq: Once | ORAL | Status: AC
  Administered 2019-01-20: 12:00:00 8 mg via ORAL

## 2019-01-20 MED ORDER — ONDANSETRON HCL 8 MG PO TABS
ORAL_TABLET | ORAL | Status: AC
  Filled 2019-01-20: qty 1

## 2019-01-20 MED ORDER — DIPHENHYDRAMINE HCL 25 MG PO CAPS
ORAL_CAPSULE | ORAL | Status: AC
  Filled 2019-01-20: qty 2

## 2019-01-20 NOTE — Patient Instructions (Signed)
[facility] Discharge Instructions for Patients Receiving Chemotherapy  Today you received the following chemotherapy agents: Herceptin  To help prevent nausea and vomiting after your treatment, we encourage you to take your nausea medication as directed.   If you develop nausea and vomiting that is not controlled by your nausea medication, call the clinic.   BELOW ARE SYMPTOMS THAT SHOULD BE REPORTED IMMEDIATELY:  *FEVER GREATER THAN 100.5 F  *CHILLS WITH OR WITHOUT FEVER  NAUSEA AND VOMITING THAT IS NOT CONTROLLED WITH YOUR NAUSEA MEDICATION  *UNUSUAL SHORTNESS OF BREATH  *UNUSUAL BRUISING OR BLEEDING  TENDERNESS IN MOUTH AND THROAT WITH OR WITHOUT PRESENCE OF ULCERS  *URINARY PROBLEMS  *BOWEL PROBLEMS  UNUSUAL RASH Items with * indicate a potential emergency and should be followed up as soon as possible.  Feel free to call the clinic should you have any questions or concerns. The clinic phone number is [phone].  Please show the CHEMO ALERT CARD at check-in to the Emergency Department and triage nurse.  Coronavirus (COVID-19) Are you at risk?  Are you at risk for the Coronavirus (COVID-19)?  To be considered HIGH RISK for Coronavirus (COVID-19), you have to meet the following criteria:  . Traveled to [place], [place], [place], [place] or [place]; or in the [place] to [place], [place], [place], or [place]; and have fever, cough, and shortness of breath within the last 2 weeks of travel OR . Been in close contact with a person diagnosed with COVID-19 within the last 2 weeks and have fever, cough, and shortness of breath . IF YOU DO NOT MEET THESE CRITERIA, YOU ARE CONSIDERED LOW RISK FOR COVID-19.  What to do if you are HIGH RISK for COVID-19?  . If you are having a medical emergency, call 911. . Seek medical care right away. Before you go to a doctor's office, urgent care or emergency department, call ahead and tell them about your  recent travel, contact with someone diagnosed with COVID-19, and your symptoms. You should receive instructions from your physician's office regarding next steps of care.  . When you arrive at healthcare provider, tell the healthcare staff immediately you have returned from visiting [place], [place], [place], [place] or [place]; or traveled in the [place] to [place], [place], [place], or [place]; in the last two weeks or you have been in close contact with a person diagnosed with COVID-19 in the last 2 weeks.   . Tell the health care staff about your symptoms: fever, cough and shortness of breath. . After you have been seen by a medical provider, you will be either: o Tested for (COVID-19) and discharged home on quarantine except to seek medical care if symptoms worsen, and asked to  - Stay home and avoid contact with others until you get your results (4-5 days)  - Avoid travel on public transportation if possible (such as bus, train, or airplane) or o Sent to the Emergency Department by EMS for evaluation, COVID-19 testing, and possible admission depending on your condition and test results.  What to do if you are LOW RISK for COVID-19?  Reduce your risk of any infection by using the same precautions used for avoiding the common cold or flu:  . Wash your hands often with soap and warm water for at least 20 seconds.  If soap and water are not readily available, use an alcohol-based hand sanitizer with at least 60% alcohol.  . If coughing or sneezing,   cover your mouth and nose by coughing or sneezing into the elbow areas of your shirt or coat, into a tissue or into your sleeve (not your hands). . Avoid shaking hands with others and consider head nods or verbal greetings only. . Avoid touching your eyes, nose, or mouth with unwashed hands.  . Avoid close contact with people who are sick. . Avoid places or events with large numbers of people in one location, like concerts or sporting  events. . Carefully consider travel plans you have or are making. . If you are planning any travel outside or inside the [place], visit the CDC's Travelers' Health webpage for the latest health notices. . If you have some symptoms but not all symptoms, continue to monitor at home and seek medical attention if your symptoms worsen. . If you are having a medical emergency, call 911.   ADDITIONAL HEALTHCARE OPTIONS FOR PATIENTS  [facility] Tele[facility] / e-Visit: [email or web address]         [facility]: [phone]  [facility]: [phone]                   [facility]: [phone]     

## 2019-01-27 ENCOUNTER — Other Ambulatory Visit: Payer: Self-pay

## 2019-01-27 ENCOUNTER — Inpatient Hospital Stay (HOSPITAL_BASED_OUTPATIENT_CLINIC_OR_DEPARTMENT_OTHER): Payer: PRIVATE HEALTH INSURANCE | Admitting: Medical

## 2019-01-27 ENCOUNTER — Inpatient Hospital Stay: Payer: PRIVATE HEALTH INSURANCE

## 2019-01-27 ENCOUNTER — Other Ambulatory Visit: Payer: Self-pay | Admitting: Emergency Medicine

## 2019-01-27 ENCOUNTER — Telehealth: Payer: Self-pay | Admitting: *Deleted

## 2019-01-27 ENCOUNTER — Telehealth: Payer: Self-pay

## 2019-01-27 VITALS — BP 137/86 | HR 76 | Temp 98.3°F | Resp 18 | Ht 65.0 in | Wt 129.0 lb

## 2019-01-27 DIAGNOSIS — C50212 Malignant neoplasm of upper-inner quadrant of left female breast: Secondary | ICD-10-CM

## 2019-01-27 DIAGNOSIS — R112 Nausea with vomiting, unspecified: Secondary | ICD-10-CM | POA: Diagnosis not present

## 2019-01-27 DIAGNOSIS — Z170 Estrogen receptor positive status [ER+]: Secondary | ICD-10-CM

## 2019-01-27 DIAGNOSIS — R197 Diarrhea, unspecified: Secondary | ICD-10-CM

## 2019-01-27 DIAGNOSIS — Z95828 Presence of other vascular implants and grafts: Secondary | ICD-10-CM

## 2019-01-27 DIAGNOSIS — C7951 Secondary malignant neoplasm of bone: Secondary | ICD-10-CM

## 2019-01-27 DIAGNOSIS — Z5112 Encounter for antineoplastic immunotherapy: Secondary | ICD-10-CM | POA: Diagnosis not present

## 2019-01-27 DIAGNOSIS — C7931 Secondary malignant neoplasm of brain: Secondary | ICD-10-CM | POA: Diagnosis not present

## 2019-01-27 LAB — CMP (CANCER CENTER ONLY)
ALT: 18 U/L (ref 0–44)
AST: 18 U/L (ref 15–41)
Albumin: 3.4 g/dL — ABNORMAL LOW (ref 3.5–5.0)
Alkaline Phosphatase: 102 U/L (ref 38–126)
Anion gap: 12 (ref 5–15)
BUN: 6 mg/dL (ref 6–20)
CO2: 23 mmol/L (ref 22–32)
Calcium: 8.4 mg/dL — ABNORMAL LOW (ref 8.9–10.3)
Chloride: 100 mmol/L (ref 98–111)
Creatinine: 0.64 mg/dL (ref 0.44–1.00)
GFR, Est AFR Am: >60 mL/min (ref >60)
GFR, Estimated: >60 mL/min (ref >60)
Glucose, Bld: 102 mg/dL — ABNORMAL HIGH (ref 70–99)
Potassium: 3.9 mmol/L (ref 3.5–5.1)
Sodium: 135 mmol/L (ref 135–145)
Total Bilirubin: 0.4 mg/dL (ref 0.3–1.2)
Total Protein: 6.7 g/dL (ref 6.5–8.1)

## 2019-01-27 LAB — CBC WITH DIFFERENTIAL (CANCER CENTER ONLY)
Abs Immature Granulocytes: 0.04 10*3/uL (ref 0.00–0.07)
Basophils Absolute: 0 10*3/uL (ref 0.0–0.1)
Basophils Relative: 1 %
Eosinophils Absolute: 0 10*3/uL (ref 0.0–0.5)
Eosinophils Relative: 1 %
HCT: 33 % — ABNORMAL LOW (ref 36.0–46.0)
Hemoglobin: 11.2 g/dL — ABNORMAL LOW (ref 12.0–15.0)
Immature Granulocytes: 2 %
Lymphocytes Relative: 19 %
Lymphs Abs: 0.5 10*3/uL — ABNORMAL LOW (ref 0.7–4.0)
MCH: 32.3 pg (ref 26.0–34.0)
MCHC: 33.9 g/dL (ref 30.0–36.0)
MCV: 95.1 fL (ref 80.0–100.0)
Monocytes Absolute: 0.5 10*3/uL (ref 0.1–1.0)
Monocytes Relative: 21 %
Neutro Abs: 1.4 10*3/uL — ABNORMAL LOW (ref 1.7–7.7)
Neutrophils Relative %: 56 %
Platelet Count: 256 10*3/uL (ref 150–400)
RBC: 3.47 MIL/uL — ABNORMAL LOW (ref 3.87–5.11)
RDW: 17.1 % — ABNORMAL HIGH (ref 11.5–15.5)
WBC Count: 2.4 10*3/uL — ABNORMAL LOW (ref 4.0–10.5)
nRBC: 0.8 % — ABNORMAL HIGH (ref 0.0–0.2)

## 2019-01-27 LAB — MAGNESIUM: Magnesium: 2.2 mg/dL (ref 1.7–2.4)

## 2019-01-27 MED ORDER — SODIUM CHLORIDE 0.9% FLUSH
10.0000 mL | INTRAVENOUS | Status: DC | PRN
  Administered 2019-01-27: 10 mL via INTRAVENOUS
  Filled 2019-01-27: qty 10

## 2019-01-27 MED ORDER — SODIUM CHLORIDE 0.9 % IV SOLN
INTRAVENOUS | Status: DC
  Administered 2019-01-27: 15:00:00 via INTRAVENOUS
  Filled 2019-01-27 (×2): qty 250

## 2019-01-27 MED ORDER — DEXAMETHASONE SODIUM PHOSPHATE 10 MG/ML IJ SOLN
INTRAMUSCULAR | Status: AC
  Filled 2019-01-27: qty 1

## 2019-01-27 MED ORDER — DEXAMETHASONE SODIUM PHOSPHATE 10 MG/ML IJ SOLN
10.0000 mg | Freq: Once | INTRAMUSCULAR | Status: AC
  Administered 2019-01-27: 10 mg via INTRAVENOUS

## 2019-01-27 MED ORDER — SODIUM CHLORIDE 0.9 % IV SOLN: 10.0000 mg | Freq: Once | INTRAVENOUS | Status: DC

## 2019-01-27 MED ORDER — ONDANSETRON HCL 4 MG/2ML IJ SOLN
INTRAMUSCULAR | Status: AC
  Filled 2019-01-27: qty 4

## 2019-01-27 MED ORDER — LORAZEPAM 0.5 MG PO TABS: 0.5000 mg | ORAL_TABLET | Freq: Four times a day (QID) | ORAL | 0 refills | Status: DC | PRN

## 2019-01-27 MED ORDER — ONDANSETRON HCL 4 MG/2ML IJ SOLN
8.0000 mg | Freq: Once | INTRAMUSCULAR | Status: AC
  Administered 2019-01-27: 8 mg via INTRAVENOUS

## 2019-01-27 MED ORDER — HEPARIN SOD (PORK) LOCK FLUSH 100 UNIT/ML IV SOLN
500.0000 [IU] | Freq: Once | INTRAVENOUS | Status: AC | PRN
  Administered 2019-01-27: 500 [IU] via INTRAVENOUS
  Filled 2019-01-27: qty 5

## 2019-01-27 NOTE — Telephone Encounter (Signed)
Received PA for ativan.  Placed in PA box for review.

## 2019-01-27 NOTE — Telephone Encounter (Signed)
RN spoke with patient, patient with nausea, vomiting, and diarrhea over weekend.  Pt spoke with on call and Phenergan was called to pharmacy.  Pt unable to pick up script.   Pt attempting small meals Q 2 hours, and minimal amounts of liquid.  Pt vomited X 2 this AM.  Unable to tolerate meds PO.    Pt to be evaluated in symptom management today by Sandi Mealy, PA-C per Dr. Geralyn Flash recommendations.   Pt aware.

## 2019-01-27 NOTE — Progress Notes (Signed)
Pt given 1L IVF NS and antiemetics today, tolerated well.  Reports feeling better at end of infusion.  Given food and drink, able to tolerated a small amount.  VSS.  A&Ox4.  Ambulatory w/steady gait to exit with belongings, friend taking them home.  Pt verbalized understanding of d/c instructions (small frequent meals, BRAT diet, oral hydration, antiemetic use) and to f/u as needed.

## 2019-01-27 NOTE — Patient Instructions (Signed)

## 2019-01-27 NOTE — Progress Notes (Signed)
Symptoms Management Clinic Progress Note   Miranda Woods [id number] 25-Jun-1956 63 y.o.  Miranda Woods is managed by Dr. Nicholas Lose  Actively treated with chemotherapy/immunotherapy/hormonal therapy: yes  Current therapy: Xeloda, Herceptin, Tucatinib  Last treated:  01/20/2019  Next scheduled appointment with provider: 02/10/2019  Assessment: Plan:    Nausea and vomiting: Patient was given Zofran 8 mg IV x1, dexamethasone 10 mg IV x1, and 1 L of normal saline IV.  Additionally she was given a prescription for Ativan 0.5 mg sublingual every 6 hours as needed for nausea.  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 Nicholas Lose and is status post cycle 3, day 1 of Xeloda, Herceptin, Tucatinib which was dosed on 01/20/2019.  Please see After Visit Summary for patient specific instructions.  Future Appointments  Date Time Provider [place]  02/10/2019  7:45 AM [facility] LAB 5 [facility] None  02/10/2019  8:00 AM [facility] [place] None  02/10/2019  8:45 AM Nicholas Lose, MD [facility] None  02/10/2019  9:45 AM [facility] INFUSION [facility] None  02/17/2019  8:00 AM [facility] LAB 1 [facility] None  03/03/2019 10:15 AM [facility] LAB 3 [facility] None  03/03/2019 10:30 AM [facility] [place] FLUSH [facility] None  03/03/2019 11:00 AM Nicholas Lose, MD [facility] None  03/03/2019 11:45 AM [facility] INFUSION [facility] None  03/24/2019  8:15 AM [facility] LAB 2 [facility] None  03/24/2019  8:30 AM [facility] [place] FLUSH [facility] None  03/24/2019  9:00 AM Nicholas Lose, MD [facility] None  03/24/2019 10:00 AM [facility] INFUSION [facility] None  04/14/2019  8:00 AM [facility] LAB 2 [facility] None  04/14/2019  8:15 AM [facility] [place] FLUSH [facility] None  04/14/2019  8:45 AM Nicholas Lose, MD [facility] None  04/14/2019 10:00 AM [facility] INFUSION [facility] None    No  orders of the defined types were placed in this encounter.      Subjective:   Patient ID:  Miranda Woods is a 63 y.o. (DOB 1956/06/18) female.  Chief Complaint:  Chief Complaint  Patient presents with  . Emesis    HPI [name] is a 63 year old female with a diagnosis of a metastatic breast cancer with bone and brain metastasis.  She is managed by Dr. Nicholas Lose and is currently treated with Xeloda, Herceptin, and Tucatinib.  She contacted our office today stating that she was having nausea, vomiting, and diarrhea this past weekend.  She contacted the on-call provider and was given a prescription for Phenergan.  She was unable to pick up prescription.  She has been attempting to eat small meals every 2 hours and is only been able to take a minimal amount of fluid.  She has vomited twice this morning.  She is unable to tolerate any of her p.o. meds.  Medications: I have reviewed the patient's current medications.  Allergies:  Allergies  Allergen Reactions  . Codeine Hives and Itching    Tolerates hydrocodone    Past Medical History:  Diagnosis Date  . ADHD (attention deficit hyperactivity disorder)   . Breast cancer ([place])    left 2018  . Cancer ([place]) 12/2016   left breast cancer  . Cancer of left breast metastatic to brain ([place]) 10/2018  . Fibrocystic breast disease   . History of radiation therapy 05/21/17- 06/15/17   Left Breast/ 40.05 Gy in 15 fractions, Left Breast Boost/ 10 Gy in 5 fractions.   . Personal history of  chemotherapy   . Personal history of radiation therapy     Past Surgical History:  Procedure Laterality Date  . ANKLE FRACTURE SURGERY Left   . APPENDECTOMY    . BREAST BIOPSY Left 09/14/2016   [place]  . BREAST BIOPSY Right 06/2017   benign  . BREAST BIOPSY Left 09/27/2017   benign  . BREAST EXCISIONAL BIOPSY Left 04/23/2017  . BREAST LUMPECTOMY Left 03/05/2017  . BREAST LUMPECTOMY WITH RADIOACTIVE SEED AND SENTINEL LYMPH NODE BIOPSY  Left 03/05/2017   Procedure: LEFT BREAST LUMPECTOMY WITH RADIOACTIVE SEED AND SENTINEL LYMPH NODE BIOPSY AND BLUE DYE INJECTION;  Surgeon: Rolm Bookbinder, MD;  Location: [facility];  Service: General;  Laterality: Left;  . BREAST LUMPECTOMY WITH RADIOACTIVE SEED LOCALIZATION Left 04/23/2017   Procedure: EXCISION OF LEFT BREAST SEED LOCALIZED MARGINS (TWO SEEDS);  Surgeon: Rolm Bookbinder, MD;  Location: [facility];  Service: General;  Laterality: Left;  . HUMERUS FRACTURE SURGERY Left   . IR GENERIC HISTORICAL  10/10/2016   IR US GUIDE VASC ACCESS RIGHT 10/10/2016 Jacqulynn Cadet, MD [facility]  . IR GENERIC HISTORICAL  10/10/2016   IR FLUORO GUIDE PORT INSERTION RIGHT 10/10/2016 Jacqulynn Cadet, MD [facility]  . ORIF WRIST FRACTURE    . PORTACATH PLACEMENT Right 12/02/2018   Procedure: INSERTION PORT-A-CATH WITH ULTRASOUND;  Surgeon: Rolm Bookbinder, MD;  Location: [facility];  Service: General;  Laterality: Right;  . TEMPOROMANDIBULAR JOINT SURGERY  1986    Family History  Problem Relation Age of Onset  . Heart disease Father   . Hypertension Father   . Thyroid disease Mother     Social History   Socioeconomic History  . Marital status: Single    Spouse name: [name]  . Number of children: 0  . Years of education: Not on file  . Highest education level: Not on file  Occupational History  . Occupation: counselor  Social Needs  . Financial resource strain: Not on file  . Food insecurity    Worry: Not on file    Inability: Not on file  . Transportation needs    Medical: Not on file    Non-medical: Not on file  Tobacco Use  . Smoking status: Former Smoker    Types: Cigarettes  . Smokeless tobacco: Never Used  Substance and Sexual Activity  . Alcohol use: No    Comment: ocassional wine  . Drug use: No  . Sexual activity: Not Currently    Partners: Male    Birth control/protection: Post-menopausal  Lifestyle  .  Physical activity    Days per week: Not on file    Minutes per session: Not on file  . Stress: Not on file  Relationships  . Social Herbalist on phone: Not on file    Gets together: Not on file    Attends religious service: Not on file    Active member of club or organization: Not on file    Attends meetings of clubs or organizations: Not on file    Relationship status: Not on file  . Intimate partner violence    Fear of current or ex partner: Not on file    Emotionally abused: Not on file    Physically abused: Not on file    Forced sexual activity: Not on file  Other Topics Concern  . Not on file  Social History Narrative  . Not on file    Past Medical History, Surgical history,  Social history, and Family history were reviewed and updated as appropriate.   Please see review of systems for further details on the patient's review from today.   Review of Systems:  Review of Systems  Constitutional: Positive for appetite change. Negative for chills, diaphoresis and fever.  HENT: Negative for trouble swallowing.   Respiratory: Negative for cough, choking, shortness of breath and wheezing.   Cardiovascular: Negative for chest pain and palpitations.  Gastrointestinal: Positive for nausea and vomiting. Negative for constipation and diarrhea.  Genitourinary: Negative for decreased urine volume and difficulty urinating.  Neurological: Negative for headaches.    Objective:   Physical Exam:  BP 137/86   Pulse 76   Temp 98.3 F (36.8 C) (Temporal)   Resp 18   Ht 5\' 5"  (1.651 m)   Wt 129 lb (58.5 kg)   LMP 11/28/2011   SpO2 99%   BMI 21.47 kg/m  ECOG: 1  Physical Exam Constitutional:      General: She is not in acute distress.    Appearance: She is not diaphoretic.  HENT:     Head: Normocephalic and atraumatic.  Eyes:     General: No scleral icterus.       Right eye: No discharge.        Left eye: No discharge.     Conjunctiva/sclera: Conjunctivae normal.   Cardiovascular:     Rate and Rhythm: Normal rate and regular rhythm.     Heart sounds: Normal heart sounds. No murmur. No friction rub. No gallop.   Pulmonary:     Effort: Pulmonary effort is normal. No respiratory distress.     Breath sounds: Normal breath sounds. No stridor. No wheezing or rales.  Abdominal:     General: Bowel sounds are normal. There is no distension.     Palpations: Abdomen is soft. There is no mass.     Tenderness: There is no abdominal tenderness. There is no guarding or rebound.  Skin:    General: Skin is warm and dry.  Neurological:     Mental Status: She is alert.     Coordination: Coordination normal.     Gait: Gait normal.  Psychiatric:        Mood and Affect: Mood normal.        Behavior: Behavior normal.        Thought Content: Thought content normal.        Judgment: Judgment normal.     Lab Review:     Component Value Date/Time   NA 135 01/27/2019 1421   NA 140 07/11/2017 1015   K 3.9 01/27/2019 1421   K 4.0 07/11/2017 1015   CL 100 01/27/2019 1421   CO2 23 01/27/2019 1421   CO2 24 07/11/2017 1015   GLUCOSE 102 (H) 01/27/2019 1421   GLUCOSE 93 07/11/2017 1015   BUN 6 01/27/2019 1421   BUN 14.2 07/11/2017 1015   CREATININE 0.64 01/27/2019 1421   CREATININE 0.6 07/11/2017 1015   CALCIUM 8.4 (L) 01/27/2019 1421   CALCIUM 9.0 07/11/2017 1015   PROT 6.7 01/27/2019 1421   PROT 6.5 07/11/2017 1015   ALBUMIN 3.4 (L) 01/27/2019 1421   ALBUMIN 3.7 07/11/2017 1015   AST 18 01/27/2019 1421   AST 14 07/11/2017 1015   ALT 18 01/27/2019 1421   ALT 13 07/11/2017 1015   ALKPHOS 102 01/27/2019 1421   ALKPHOS 66 07/11/2017 1015   BILITOT 0.4 01/27/2019 1421   BILITOT 0.50 07/11/2017 1015   GFRNONAA >60 01/27/2019   [place] 01/27/2019 1421       Component Value Date/Time   WBC 2.4 (L) 01/27/2019 1421   WBC 7.2 12/12/2018 1140   RBC 3.47 (L) 01/27/2019 1421   HGB 11.2 (L) 01/27/2019 1421   HGB 12.8 07/11/2017 1015   HCT 33.0 (L)  01/27/2019 1421   HCT 37.8 07/11/2017 1015   PLT 256 01/27/2019 1421   PLT 192 07/11/2017 1015   MCV 95.1 01/27/2019 1421   MCV 93.3 07/11/2017 1015   MCH 32.3 01/27/2019 1421   MCHC 33.9 01/27/2019 1421   RDW 17.1 (H) 01/27/2019 1421   RDW 12.1 07/11/2017 1015   LYMPHSABS 0.5 (L) 01/27/2019 1421   LYMPHSABS 0.7 (L) 07/11/2017 1015   MONOABS 0.5 01/27/2019 1421   MONOABS 0.3 07/11/2017 1015   EOSABS 0.0 01/27/2019 1421   EOSABS 0.1 07/11/2017 1015   BASOSABS 0.0 01/27/2019 1421   BASOSABS 0.0 07/11/2017 1015   -------------------------------  Imaging from last 24 hours (if applicable):  Radiology interpretation: No results found.

## 2019-01-28 ENCOUNTER — Telehealth: Payer: Self-pay | Admitting: Medical

## 2019-01-28 ENCOUNTER — Encounter: Payer: Self-pay | Admitting: Hematology and Oncology

## 2019-01-28 NOTE — Telephone Encounter (Signed)
No los per 6/29. °

## 2019-01-29 ENCOUNTER — Other Ambulatory Visit: Payer: Self-pay

## 2019-01-29 MED ORDER — GABAPENTIN 300 MG PO CAPS: 300.0000 mg | ORAL_CAPSULE | Freq: Every day | ORAL | 3 refills | Status: AC

## 2019-01-30 ENCOUNTER — Other Ambulatory Visit: Payer: Self-pay

## 2019-01-30 ENCOUNTER — Telehealth: Payer: Self-pay

## 2019-01-30 DIAGNOSIS — C50212 Malignant neoplasm of upper-inner quadrant of left female breast: Secondary | ICD-10-CM

## 2019-01-30 NOTE — Telephone Encounter (Signed)
RN spoke with patient, pt reports vomiting "a few times" today.  Pt inquiring if she should stop Xeloda.  Pt is on day 13 of 14 day cycle.    Per Dr. Lindi Adie stop Xeloda and resume after week off.  Pt aware, voiced understanding.  RN offered appointment for IVF's, patient declined stating she felt she could manage intake at home.  After discussion, appointment for IVF and antiemetics is scheduled for 7/4 if patient continues with vomiting.  If patient is able to tolerate home hydration she will not show to this appointment.

## 2019-02-01 ENCOUNTER — Inpatient Hospital Stay: Payer: PRIVATE HEALTH INSURANCE | Attending: Hematology and Oncology

## 2019-02-01 DIAGNOSIS — C787 Secondary malignant neoplasm of liver and intrahepatic bile duct: Secondary | ICD-10-CM | POA: Insufficient documentation

## 2019-02-01 DIAGNOSIS — C7951 Secondary malignant neoplasm of bone: Secondary | ICD-10-CM | POA: Insufficient documentation

## 2019-02-01 DIAGNOSIS — Z5112 Encounter for antineoplastic immunotherapy: Secondary | ICD-10-CM | POA: Insufficient documentation

## 2019-02-01 DIAGNOSIS — C50212 Malignant neoplasm of upper-inner quadrant of left female breast: Secondary | ICD-10-CM | POA: Insufficient documentation

## 2019-02-01 DIAGNOSIS — Z170 Estrogen receptor positive status [ER+]: Secondary | ICD-10-CM | POA: Insufficient documentation

## 2019-02-01 DIAGNOSIS — C7931 Secondary malignant neoplasm of brain: Secondary | ICD-10-CM | POA: Insufficient documentation

## 2019-02-04 NOTE — Assessment & Plan Note (Signed)
Extensive and widespread brain metastases: Patient has extensive visual symptoms are started off fairly mild in January 2020. She started losing peripheral vision and later ability to read. She was also having poor thinking and thought process. She has no motor deficits. Denies any sensory deficits.  Brain MRI 11/27/2018: Extensive widespread brain metastases. Largest tumor in the left occipital lobe 4.5 cm. Several other large tumors noted in multiple areas. Extensive smaller tumors are noted including the cerebellum.  PET/CT 12/05/2018:Widespread hypermetabolic bone metastases throughout the axilla and appendicular skeleton, left femoral neck metastases at risk for pathologic fracture, multiple liver metastases, left adrenal metastases, left supraclavicular lymph node and tiny 3 mm left upper lobe lung nodule  Liver biopsy 12/12/2018: Metastatic breast cancer ER 100%, PR 0%, Ki-67 50%, HER-2 3+ positive [facility] molecular testing: ER +95%, HER-2 positive, PR +25%,AR +85%, BRCA negative, PD-L1 +1%,PIK3CA mutationpathogenic variant exon 21, PTEN: No mutation ---------------------------------------------------------------------------------------------------------------------- Current treatment: Completed whole brain radiation, palliative radiation to the left femur Xeloda, Herceptin, Tucatinib(started 12/24/2018)  Intractable pain in hips: Radiation completed, pain medications Palliative care   Plan:3 months of Xeloda with Herceptin and Tucatinib. If the scans after 3 months reveal that her disease is under good control then we can make a decision on stopping Xeloda or reducing the dosage.  Nausea and vomiting issues: Treated symptomatically.  Most likely related to Xeloda.  Return to clinic in 3 weeks for next treatment

## 2019-02-05 ENCOUNTER — Telehealth: Payer: Self-pay

## 2019-02-05 MED ORDER — DEXAMETHASONE 1 MG PO TABS: 1.0000 mg | ORAL_TABLET | Freq: Every day | ORAL | 0 refills | Status: AC

## 2019-02-05 NOTE — Telephone Encounter (Signed)
RN spoke with Richardson Landry, POA/patient's brother, regarding concerns with patient having lack of appetite.  Per patient's brother, pt eating 1-2 small bites per meal daily.  Pt reports sleeping a lot as well.    RN reviewed with Dr. Lindi Adie - verbal orders for Decadron 1mg  daily given to increase appetite and energy.  RN notified POA, and notified patient.

## 2019-02-06 ENCOUNTER — Encounter: Payer: Self-pay | Admitting: General Practice

## 2019-02-06 NOTE — Progress Notes (Signed)
[facility] CSW Progress Notes  Proof of submission received from [facility] for [facility] - submitted as a TERI case.  Edwyna Shell, LCSW Clinical Social Worker Phone:  [phone]

## 2019-02-07 ENCOUNTER — Other Ambulatory Visit: Payer: Self-pay

## 2019-02-07 ENCOUNTER — Telehealth: Payer: Self-pay

## 2019-02-07 DIAGNOSIS — Z170 Estrogen receptor positive status [ER+]: Secondary | ICD-10-CM

## 2019-02-07 DIAGNOSIS — C50212 Malignant neoplasm of upper-inner quadrant of left female breast: Secondary | ICD-10-CM

## 2019-02-07 NOTE — Progress Notes (Signed)
Orders placed under sign and held for IVF and Aloxi per MD recommendations.

## 2019-02-07 NOTE — Telephone Encounter (Signed)
Pt called to report continuation with nausea and decrease intake with fluids and food.  Pt was out of town and has not yet started dexamethasone.  Pt attempting small meals and small amounts of fluid.  Emesis X 1 this AM.    Per MD recommendations, IVF over 1 hour tomorrow, 7/11 with antiemetic Aloxi.  Follow up with MD on 02/10/2019.  Pt aware voiced understanding and agreement.

## 2019-02-08 ENCOUNTER — Inpatient Hospital Stay: Payer: PRIVATE HEALTH INSURANCE

## 2019-02-08 ENCOUNTER — Telehealth: Payer: Self-pay

## 2019-02-08 NOTE — Progress Notes (Signed)
Patient Care Team: [name], MD as PCP - General (Family Medicine) [name], MD as Consulting Physician (General Surgery) Nicholas Lose, MD as Consulting Physician (Hematology and Oncology) Eppie Gibson, MD as Attending Physician (Radiation Oncology) Gardenia Phlegm, NP as Nurse Practitioner (Hematology and Oncology)  DIAGNOSIS:    ICD-10-CM   1. Malignant neoplasm of upper-inner quadrant of left breast in female, estrogen receptor positive ([place])  C50.212    Z17.0   2. Brain metastases (HCC)  C79.31 oxyCODONE (ROXICODONE) 5 MG immediate release tablet  3. Non-intractable vomiting with nausea, unspecified vomiting type  R11.2 LORazepam (ATIVAN) 0.5 MG tablet    SUMMARY OF ONCOLOGIC HISTORY: Oncology History  Malignant neoplasm of upper-inner quadrant of left breast in female, estrogen receptor positive ([place])  09/14/2016 Initial Diagnosis   Palpable left breast mass for 2 months, ultrasound at 9:00: 4.7 x 3.6 x 3.3 cm, axilla negative: U/S Bx: Grade 2 IDC ER 95%, PR 50%, HER-2 positive ratio 6.17, Ki-67 20%, T2 N0 stage IIA clinical stage   09/27/2016 Breast MRI   Left breast: 3.5 cm irregular necrotic mass; left axillary lymph node 1.2 cm; right breast 0.6 cm mass at 6:00 position 0.5 cm mass right breast lower inner quadrant    10/11/2016 - 01/24/2017 Neo-Adjuvant Chemotherapy   Taxotere, Carboplatin, Herceptin, Perjeta x 6 cycles followed by Herceptin and Perjeta maintenance for one year   01/26/2017 Breast MRI   Left breast: Irregular enhancing mass measuring 2.7 cm, smaller than previous, no abnormal lymph nodes; right breast: No mass or enhancement   03/05/2017 Surgery   Left lumpectomy: IDC grade 2, 2.5 cm, margins negative, 0/2 lymph nodes negative, ER 100%, PR 50%, HER-2 positive ratio 4.88, T2 N0 stage IB AJCC 8   04/17/2017 Mammogram   2 groups of suspicious residual calcifications in the left breast spanning 4.8 cm one is anterior to lumpectomy the  other is medial aspect of lumpectomy   05/21/2017 - 06/15/2017 Radiation Therapy   Adjuvant radiation therapy   07/11/2017 -  Anti-estrogen oral therapy   Letrozole 2.5 mg daily; Neratinib started 11/28/2017   12/03/2018 - 12/20/2018 Radiation Therapy   Radiation to right hip   12/05/2018 PET scan   Widespread hypermetabolic bone metastases throughout the axilla and appendicular skeleton, left femoral neck metastases at risk for pathologic fracture, multiple liver metastases, left adrenal metastases, left supraclavicular lymph node and tiny 3 mm left upper lobe lung nodule   12/09/2018 -  Chemotherapy   Xeloda, Herceptin, Tucatinib   12/31/2018 Miscellaneous   [facility] molecular testing: ER +95%, HER-2 positive, PR +25%, AR +85%, BRCA negative, PD-L1 +1%,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 with above-mentioned history of metastatic breast cancer with extensive brain and systemic metastases. She is currently on Xeloda, 2 weeks on 1 week off, and Herceptin with Tucatinib every 3 weeks. She contacted the clinic on several occasions about persistent vomiting. Xeloda was stopped on 01/30/19 for one week. She presents to the clinic today for treatment.   Decadron was started and that relieved her nausea and also provided her more energy and she is also less depressed.  She thinks nausea may be because of gabapentin.  She started taking gabapentin because of pain that radiated along the left arm.  REVIEW OF SYSTEMS:   Constitutional: Denies fevers, chills or abnormal weight  loss Eyes: Denies blurriness of vision Ears, nose, mouth, throat, and face: Denies mucositis or sore throat Respiratory: Denies cough, dyspnea or wheezes Cardiovascular: Denies palpitation, chest discomfort Gastrointestinal: Denies nausea, heartburn or change in bowel habits  Skin: Denies abnormal skin rashes Lymphatics: Denies new lymphadenopathy or easy bruising Neurological: Left arm pain Behavioral/Psych: Mood is stable, no new changes  Extremities: No lower extremity edema Breast: denies any pain or lumps or nodules in either breasts All other systems were reviewed with the patient and are negative.  I have reviewed the past medical history, past surgical history, social history and family history with the patient and they are unchanged from previous note.  ALLERGIES:  is allergic to codeine.  MEDICATIONS:  Current Outpatient Medications  Medication Sig Dispense Refill  . Calcium-Magnesium-Zinc (CAL-MAG-ZINC PO) Take 1 tablet by mouth daily.    . capecitabine (XELODA) 500 MG tablet Take 2 tablets (1,000 mg total) by mouth 2 (two) times daily after a meal. 14 days on and 7 days off 56 tablet 6  . Cholecalciferol (VITAMIN D3) 125 MCG (5000 UT) CAPS Take 5,000 Units by mouth daily.    Marland Kitchen dexamethasone (DECADRON) 1 MG tablet Take 1 tablet (1 mg total) by mouth daily. 90 tablet 0  . diphenhydrAMINE (BENADRYL) 25 MG tablet Take 50 mg by mouth at bedtime.    . gabapentin (NEURONTIN) 300 MG capsule Take 1 capsule (300 mg total) by mouth at bedtime. 30 capsule 3  . lidocaine-prilocaine (EMLA) cream Apply to affected area once (Patient taking differently: Apply 1 application topically daily as needed (pain). Apply to affected area once) 30 g 3  . LORazepam (ATIVAN) 0.5 MG tablet Place 1 tablet (0.5 mg total) under the tongue every 6 (six) hours as needed (Nausea and vomiting). 60 tablet 0  . ondansetron (ZOFRAN) 8 MG tablet Take 0.5-1 tablets (4-8 mg total) by mouth every 8 (eight) hours as needed for nausea or vomiting. 30 tablet 6  . oxyCODONE (ROXICODONE) 5 MG immediate release tablet Take 1 tablet (5 mg total) by mouth every 4 (four) hours as needed for severe pain. 90 tablet 0  . Tucatinib (TUKYSA) 150 MG TABS Take 300 mg by mouth 2 (two) times a day. 120 tablet  1  . Turmeric 500 MG CAPS Take 500 mg by mouth daily.     No current facility-administered medications for this visit.    Facility-Administered Medications Ordered in Other Visits  Medication Dose Route Frequency Provider Last Rate Last Dose  . 0.9 %  sodium chloride infusion   Intravenous Once Nicholas Lose, MD      . acetaminophen (TYLENOL) tablet 650 mg  650 mg Oral Once Nicholas Lose, MD      . diphenhydrAMINE (BENADRYL) capsule 50 mg  50 mg Oral Once Nicholas Lose, MD      . heparin lock flush 100 unit/mL  500 Units Intracatheter Once PRN Nicholas Lose, MD      . sodium chloride flush (NS) 0.9 % injection 10 mL  10 mL Intravenous PRN Nicholas Lose, MD   10 mL at 01/24/17 1116  . sodium chloride flush (NS) 0.9 % injection 10 mL  10 mL Intravenous PRN Nicholas Lose, MD   10 mL at 02/14/17 1236  . sodium chloride flush (NS) 0.9 % injection 10 mL  10 mL Intracatheter PRN Nicholas Lose, MD      . trastuzumab-dkst (OGIVRI) 378 mg in sodium chloride 0.9 % 250 mL chemo infusion  6 mg/kg (Treatment Plan  Recorded) Intravenous Once Nicholas Lose, MD        PHYSICAL EXAMINATION: ECOG PERFORMANCE STATUS: 1 - Symptomatic but completely ambulatory  Vitals:   02/10/19 0857  BP: 134/88  Pulse: 81  Resp: 18  Temp: 98.5 F (36.9 C)  SpO2: 99%   Filed Weights   02/10/19 0857  Weight: 126 lb 3.2 oz (57.2 kg)    GENERAL: alert, no distress and comfortable SKIN: skin color, texture, turgor are normal, no rashes or significant lesions EYES: normal, Conjunctiva are pink and non-injected, sclera clear OROPHARYNX: no exudate, no erythema and lips, buccal mucosa, and tongue normal  NECK: supple, thyroid normal size, non-tender, without nodularity LYMPH: no palpable lymphadenopathy in the cervical, axillary or inguinal LUNGS: clear to auscultation and percussion with normal breathing effort HEART: regular rate & rhythm and no murmurs and no lower extremity edema ABDOMEN: abdomen soft, non-tender  and normal bowel sounds MUSCULOSKELETAL: no cyanosis of digits and no clubbing  NEURO: alert & oriented x 3 with fluent speech, no focal motor/sensory deficits, left arm pain radiating from the neck down to the arm EXTREMITIES: No lower extremity edema  LABORATORY DATA:  I have reviewed the data as listed CMP Latest Ref Rng & Units 02/10/2019 01/27/2019 01/20/2019  Glucose 70 - 99 mg/dL 122(H) 102(H) 91  BUN 6 - 20 mg/dL '6 6 7  '$ Creatinine 0.44 - 1.00 mg/dL 0.67 0.64 0.67  Sodium 135 - 145 mmol/L 134(L) 135 135  Potassium 3.5 - 5.1 mmol/L 3.8 3.9 3.9  Chloride 98 - 111 mmol/L 100 100 101  CO2 22 - 32 mmol/L '22 23 25  '$ Calcium 8.9 - 10.3 mg/dL 8.7(L) 8.4(L) 8.6(L)  Total Protein 6.5 - 8.1 g/dL 6.9 6.7 6.1(L)  Total Bilirubin 0.3 - 1.2 mg/dL 0.5 0.4 0.6  Alkaline Phos 38 - 126 U/L 99 102 89  AST 15 - 41 U/L '15 18 17  '$ ALT 0 - 44 U/L '18 18 16    '$ Lab Results  Component Value Date   WBC 4.7 02/10/2019   HGB 10.8 (L) 02/10/2019   HCT 32.0 (L) 02/10/2019   MCV 95.5 02/10/2019   PLT 302 02/10/2019   NEUTROABS 3.9 02/10/2019    ASSESSMENT & PLAN:  Malignant neoplasm of upper-inner quadrant of left breast in female, estrogen receptor positive ([place]) Extensive and widespread brain metastases: Patient has extensive visual symptoms are started off fairly mild in January 2020. She started losing peripheral vision and later ability to read. She was also having poor thinking and thought process. She has no motor deficits. Denies any sensory deficits.  Brain MRI 11/27/2018: Extensive widespread brain metastases. Largest tumor in the left occipital lobe 4.5 cm. Several other large tumors noted in multiple areas. Extensive smaller tumors are noted including the cerebellum.  PET/CT 12/05/2018:Widespread hypermetabolic bone metastases throughout the axilla and appendicular skeleton, left femoral neck metastases at risk for pathologic fracture, multiple liver metastases, left adrenal metastases, left  supraclavicular lymph node and tiny 3 mm left upper lobe lung nodule  Liver biopsy 12/12/2018: Metastatic breast cancer ER 100%, PR 0%, Ki-67 50%, HER-2 3+ positive [facility] molecular testing: ER +95%, HER-2 positive, PR +25%,AR +85%, BRCA negative, PD-L1 +1%,PIK3CA mutationpathogenic variant exon 21, PTEN: No mutation ---------------------------------------------------------------------------------------------------------------------- Current treatment: Completed whole brain radiation, palliative radiation to the left femur Xeloda, Herceptin, Tucatinib(started 12/24/2018)  Intractable pain in hips: Radiation completed, pain medications Palliative care will be reconsulted Major depression: I discussed with her the importance of counseling and she will contact her  counselor and get back in.  We also discussed antidepressants but because she takes so much medication she is not keen on taking it at this time.  She may decide on it at a later time.  Cognitive difficulties: Patient is very concerned about her cognitive impairment.  She is worried that this could progress and cause permanent disability.  Plan:3 months of Xeloda with Herceptin and Tucatinib. If the scans after 3 months reveal that her disease is under good control then we can make a decision on stopping Xeloda or reducing the dosage.  Nausea and vomiting issues: Treated symptomatically.  Most likely related to Xeloda. Our plan is to perform the scans towards end of August. Return to clinic in 3 weeks for next treatment    No orders of the defined types were placed in this encounter.  The patient has a good understanding of the overall plan. she agrees with it. she will call with any problems that may develop before the next visit here.  Nicholas Lose, MD 02/10/2019  Julious Oka Dorshimer am acting as scribe for Dr. Nicholas Lose.  I have reviewed the above documentation for accuracy and completeness, and I agree with the above.

## 2019-02-08 NOTE — Telephone Encounter (Signed)
Called to see if you were coming in for fluids; pt states she is able to eat, drink, and manage nausea at home and does not wish to come in for fluids today. Reminded pt to call for any needs, confirmed Monday appt, canceled today's appt.

## 2019-02-10 ENCOUNTER — Inpatient Hospital Stay: Payer: PRIVATE HEALTH INSURANCE

## 2019-02-10 ENCOUNTER — Other Ambulatory Visit: Payer: Self-pay

## 2019-02-10 ENCOUNTER — Inpatient Hospital Stay (HOSPITAL_BASED_OUTPATIENT_CLINIC_OR_DEPARTMENT_OTHER): Payer: PRIVATE HEALTH INSURANCE | Admitting: Hematology and Oncology

## 2019-02-10 DIAGNOSIS — C7931 Secondary malignant neoplasm of brain: Secondary | ICD-10-CM

## 2019-02-10 DIAGNOSIS — C787 Secondary malignant neoplasm of liver and intrahepatic bile duct: Secondary | ICD-10-CM | POA: Diagnosis not present

## 2019-02-10 DIAGNOSIS — Z5112 Encounter for antineoplastic immunotherapy: Secondary | ICD-10-CM | POA: Diagnosis present

## 2019-02-10 DIAGNOSIS — Z170 Estrogen receptor positive status [ER+]: Secondary | ICD-10-CM | POA: Diagnosis not present

## 2019-02-10 DIAGNOSIS — Z95828 Presence of other vascular implants and grafts: Secondary | ICD-10-CM

## 2019-02-10 DIAGNOSIS — R112 Nausea with vomiting, unspecified: Secondary | ICD-10-CM

## 2019-02-10 DIAGNOSIS — C50212 Malignant neoplasm of upper-inner quadrant of left female breast: Secondary | ICD-10-CM

## 2019-02-10 DIAGNOSIS — C7951 Secondary malignant neoplasm of bone: Secondary | ICD-10-CM | POA: Diagnosis not present

## 2019-02-10 DIAGNOSIS — Z7189 Other specified counseling: Secondary | ICD-10-CM

## 2019-02-10 LAB — CMP (CANCER CENTER ONLY)
ALT: 18 U/L (ref 0–44)
AST: 15 U/L (ref 15–41)
Albumin: 3.9 g/dL (ref 3.5–5.0)
Alkaline Phosphatase: 99 U/L (ref 38–126)
Anion gap: 12 (ref 5–15)
BUN: 6 mg/dL (ref 6–20)
CO2: 22 mmol/L (ref 22–32)
Calcium: 8.7 mg/dL — ABNORMAL LOW (ref 8.9–10.3)
Chloride: 100 mmol/L (ref 98–111)
Creatinine: 0.67 mg/dL (ref 0.44–1.00)
GFR, Est AFR Am: >60 mL/min (ref >60)
GFR, Estimated: >60 mL/min (ref >60)
Glucose, Bld: 122 mg/dL — ABNORMAL HIGH (ref 70–99)
Potassium: 3.8 mmol/L (ref 3.5–5.1)
Sodium: 134 mmol/L — ABNORMAL LOW (ref 135–145)
Total Bilirubin: 0.5 mg/dL (ref 0.3–1.2)
Total Protein: 6.9 g/dL (ref 6.5–8.1)

## 2019-02-10 LAB — CBC WITH DIFFERENTIAL (CANCER CENTER ONLY)
Abs Immature Granulocytes: 0.03 10*3/uL (ref 0.00–0.07)
Basophils Absolute: 0 10*3/uL (ref 0.0–0.1)
Basophils Relative: 0 %
Eosinophils Absolute: 0 10*3/uL (ref 0.0–0.5)
Eosinophils Relative: 0 %
HCT: 32 % — ABNORMAL LOW (ref 36.0–46.0)
Hemoglobin: 10.8 g/dL — ABNORMAL LOW (ref 12.0–15.0)
Immature Granulocytes: 1 %
Lymphocytes Relative: 7 %
Lymphs Abs: 0.3 10*3/uL — ABNORMAL LOW (ref 0.7–4.0)
MCH: 32.2 pg (ref 26.0–34.0)
MCHC: 33.8 g/dL (ref 30.0–36.0)
MCV: 95.5 fL (ref 80.0–100.0)
Monocytes Absolute: 0.4 10*3/uL (ref 0.1–1.0)
Monocytes Relative: 9 %
Neutro Abs: 3.9 10*3/uL (ref 1.7–7.7)
Neutrophils Relative %: 83 %
Platelet Count: 302 10*3/uL (ref 150–400)
RBC: 3.35 MIL/uL — ABNORMAL LOW (ref 3.87–5.11)
RDW: 17 % — ABNORMAL HIGH (ref 11.5–15.5)
WBC Count: 4.7 10*3/uL (ref 4.0–10.5)
nRBC: 0 % (ref 0.0–0.2)

## 2019-02-10 MED ORDER — HEPARIN SOD (PORK) LOCK FLUSH 100 UNIT/ML IV SOLN
500.0000 [IU] | Freq: Once | INTRAVENOUS | Status: AC | PRN
  Administered 2019-02-10: 500 [IU]
  Filled 2019-02-10: qty 5

## 2019-02-10 MED ORDER — ACETAMINOPHEN 325 MG PO TABS
ORAL_TABLET | ORAL | Status: AC
  Filled 2019-02-10: qty 2

## 2019-02-10 MED ORDER — ACETAMINOPHEN 325 MG PO TABS
650.0000 mg | ORAL_TABLET | Freq: Once | ORAL | Status: AC
  Administered 2019-02-10: 650 mg via ORAL

## 2019-02-10 MED ORDER — DIPHENHYDRAMINE HCL 25 MG PO CAPS
ORAL_CAPSULE | ORAL | Status: AC
  Filled 2019-02-10: qty 1

## 2019-02-10 MED ORDER — LORAZEPAM 0.5 MG PO TABS: 0.5000 mg | ORAL_TABLET | Freq: Four times a day (QID) | ORAL | 0 refills | Status: DC | PRN

## 2019-02-10 MED ORDER — SODIUM CHLORIDE 0.9% FLUSH
10.0000 mL | INTRAVENOUS | Status: DC | PRN
  Administered 2019-02-10: 10 mL
  Filled 2019-02-10: qty 10

## 2019-02-10 MED ORDER — SODIUM CHLORIDE 0.9% FLUSH
10.0000 mL | INTRAVENOUS | Status: DC | PRN
  Administered 2019-02-10: 10 mL via INTRAVENOUS
  Filled 2019-02-10: qty 10

## 2019-02-10 MED ORDER — TRASTUZUMAB-DKST CHEMO 150 MG IV SOLR
6.0000 mg/kg | Freq: Once | INTRAVENOUS | Status: AC
  Administered 2019-02-10: 378 mg via INTRAVENOUS
  Filled 2019-02-10: qty 18

## 2019-02-10 MED ORDER — DIPHENHYDRAMINE HCL 25 MG PO CAPS
50.0000 mg | ORAL_CAPSULE | Freq: Once | ORAL | Status: AC
  Administered 2019-02-10: 50 mg via ORAL

## 2019-02-10 MED ORDER — SODIUM CHLORIDE 0.9 % IV SOLN
Freq: Once | INTRAVENOUS | Status: AC
  Administered 2019-02-10: 10:00:00 via INTRAVENOUS
  Filled 2019-02-10: qty 250

## 2019-02-10 MED ORDER — OXYCODONE HCL 5 MG PO TABS: 5.0000 mg | ORAL_TABLET | ORAL | 0 refills | Status: AC | PRN

## 2019-02-10 NOTE — Patient Instructions (Signed)
[facility] Discharge Instructions for Patients Receiving Chemotherapy  Today you received the following chemotherapy agent: Ogivri   To help prevent nausea and vomiting after your treatment, we encourage you to take your nausea medication as directed.    If you develop nausea and vomiting that is not controlled by your nausea medication, call the clinic.   BELOW ARE SYMPTOMS THAT SHOULD BE REPORTED IMMEDIATELY:  *FEVER GREATER THAN 100.5 F  *CHILLS WITH OR WITHOUT FEVER  NAUSEA AND VOMITING THAT IS NOT CONTROLLED WITH YOUR NAUSEA MEDICATION  *UNUSUAL SHORTNESS OF BREATH  *UNUSUAL BRUISING OR BLEEDING  TENDERNESS IN MOUTH AND THROAT WITH OR WITHOUT PRESENCE OF ULCERS  *URINARY PROBLEMS  *BOWEL PROBLEMS  UNUSUAL RASH Items with * indicate a potential emergency and should be followed up as soon as possible.  Feel free to call the clinic should you have any questions or concerns. The clinic phone number is [phone].  Please show the CHEMO ALERT CARD at check-in to the Emergency Department and triage nurse.   

## 2019-02-11 MED FILL — TUKYSA 150 MG TABS: 150 | 30 days supply | Qty: 120 | Fill #2

## 2019-02-11 MED FILL — CAPECITABINE 500 MG TABS: 500 | 21 days supply | Qty: 56 | Fill #3

## 2019-02-17 ENCOUNTER — Other Ambulatory Visit: Payer: PRIVATE HEALTH INSURANCE

## 2019-02-24 NOTE — Assessment & Plan Note (Signed)
Extensive and widespread brain metastases: Patient has extensive visual symptoms are started off fairly mild in January 2020. She started losing peripheral vision and later ability to read. She was also having poor thinking and thought process. She has no motor deficits. Denies any sensory deficits.  Brain MRI 11/27/2018: Extensive widespread brain metastases. Largest tumor in the left occipital lobe 4.5 cm. Several other large tumors noted in multiple areas. Extensive smaller tumors are noted including the cerebellum.  PET/CT 12/05/2018:Widespread hypermetabolic bone metastases throughout the axilla and appendicular skeleton, left femoral neck metastases at risk for pathologic fracture, multiple liver metastases, left adrenal metastases, left supraclavicular lymph node and tiny 3 mm left upper lobe lung nodule  Liver biopsy 12/12/2018: Metastatic breast cancer ER 100%, PR 0%, Ki-67 50%, HER-2 3+ positive [facility] molecular testing: ER +95%, HER-2 positive, PR +25%,AR +85%, BRCA negative, PD-L1 +1%,PIK3CA mutationpathogenic variant exon 21, PTEN: No mutation ---------------------------------------------------------------------------------------------------------------------- Current treatment: Completed whole brain radiation, palliative radiation to the left femur Xeloda, Herceptin, Tucatinib(started 12/24/2018)  Intractable pain in hips: Radiation completed, pain medications Palliative care will be reconsulted Major depression:  Cognitive difficulties: Patient is very concerned about her cognitive impairment.  She is worried that this could progress and cause permanent disability.  Plan:3 months of Xeloda with Herceptin and Tucatinib. If the scans after 3 months reveal that her disease is under good control then we can make a decision on stopping Xeloda or reducing the dosage.  Nausea and vomiting issues: Treated symptomatically.  Most likely related to Xeloda. Our plan is to perform  the scans towards end of August. Return to clinic in 3 weeks for next treatment with scans done prior to that

## 2019-02-28 NOTE — Progress Notes (Signed)
Patient Care Team: [name], MD as PCP - General (Family Medicine) [name], MD as Consulting Physician (General Surgery) Nicholas Lose, MD as Consulting Physician (Hematology and Oncology) Eppie Gibson, MD as Attending Physician (Radiation Oncology) Gardenia Phlegm, NP as Nurse Practitioner (Hematology and Oncology)  DIAGNOSIS:    ICD-10-CM   1. Malignant neoplasm of upper-inner quadrant of left breast in female, estrogen receptor positive ([place])  C50.212 CT Abdomen Pelvis W Contrast   Z17.0 CT Chest W Contrast    SUMMARY OF ONCOLOGIC HISTORY: Oncology History  Malignant neoplasm of upper-inner quadrant of left breast in female, estrogen receptor positive ([place])  09/14/2016 Initial Diagnosis   Palpable left breast mass for 2 months, ultrasound at 9:00: 4.7 x 3.6 x 3.3 cm, axilla negative: U/S Bx: Grade 2 IDC ER 95%, PR 50%, HER-2 positive ratio 6.17, Ki-67 20%, T2 N0 stage IIA clinical stage   09/27/2016 Breast MRI   Left breast: 3.5 cm irregular necrotic mass; left axillary lymph node 1.2 cm; right breast 0.6 cm mass at 6:00 position 0.5 cm mass right breast lower inner quadrant    10/11/2016 - 01/24/2017 Neo-Adjuvant Chemotherapy   Taxotere, Carboplatin, Herceptin, Perjeta x 6 cycles followed by Herceptin and Perjeta maintenance for one year   01/26/2017 Breast MRI   Left breast: Irregular enhancing mass measuring 2.7 cm, smaller than previous, no abnormal lymph nodes; right breast: No mass or enhancement   03/05/2017 Surgery   Left lumpectomy: IDC grade 2, 2.5 cm, margins negative, 0/2 lymph nodes negative, ER 100%, PR 50%, HER-2 positive ratio 4.88, T2 N0 stage IB AJCC 8   04/17/2017 Mammogram   2 groups of suspicious residual calcifications in the left breast spanning 4.8 cm one is anterior to lumpectomy the other is medial aspect of lumpectomy   05/21/2017 - 06/15/2017 Radiation Therapy   Adjuvant radiation therapy   07/11/2017 -  Anti-estrogen oral  therapy   Letrozole 2.5 mg daily; Neratinib started 11/28/2017   12/03/2018 - 12/20/2018 Radiation Therapy   Radiation to right hip   12/05/2018 PET scan   Widespread hypermetabolic bone metastases throughout the axilla and appendicular skeleton, left femoral neck metastases at risk for pathologic fracture, multiple liver metastases, left adrenal metastases, left supraclavicular lymph node and tiny 3 mm left upper lobe lung nodule   12/09/2018 -  Chemotherapy   Xeloda, Herceptin, Tucatinib   12/31/2018 Miscellaneous   [facility] molecular testing: ER +95%, HER-2 positive, PR +25%, AR +85%, BRCA negative, PD-L1 +1%,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 with above-mentioned history of metastatic breast cancer with extensive brain and systemic metastases. She is currently on Xeloda, 2 weeks on 1 week off, and Herceptin with Tucatinib every 3 weeks.She presents to the clinic today for treatment.  Twice a week she complains of severe nausea and vomiting to the point that she is sleeping all day and cannot do any activities.  After the initial nausea and vomiting in the morning she does not throw up again but then she does not want to eat anything.  She has not been participating in any social outdoor activities.  She is having major problems with memory.  Her brother is here in the office accompanying her because of her memory loss issues.  She thinks that the nausea issues are worse when she is on the Xeloda. She  has felt strobe's of lighting in the eye periodically.  REVIEW OF SYSTEMS:   Constitutional: Denies fevers, chills or abnormal weight loss Eyes: Denies blurriness of vision Ears, nose, mouth, throat, and face: Denies mucositis or sore throat Respiratory: Denies cough, dyspnea or wheezes Cardiovascular: Denies palpitation, chest discomfort  Gastrointestinal: Nausea and severe vomiting Skin: Denies abnormal skin rashes Lymphatics: Denies new lymphadenopathy or easy bruising Neurological: Denies numbness, tingling or new weaknesses Behavioral/Psych: Mood is stable, no new changes  Extremities: No lower extremity edema Breast: denies any pain or lumps or nodules in either breasts All other systems were reviewed with the patient and are negative.  I have reviewed the past medical history, past surgical history, social history and family history with the patient and they are unchanged from previous note.  ALLERGIES:  is allergic to codeine.  MEDICATIONS:  Current Outpatient Medications  Medication Sig Dispense Refill  . Calcium-Magnesium-Zinc (CAL-MAG-ZINC PO) Take 1 tablet by mouth daily.    . capecitabine (XELODA) 500 MG tablet Take 2 tablets (1,000 mg total) by mouth 2 (two) times daily after a meal. 14 days on and 7 days off 56 tablet 6  . Cholecalciferol (VITAMIN D3) 125 MCG (5000 UT) CAPS Take 5,000 Units by mouth daily.    Marland Kitchen dexamethasone (DECADRON) 1 MG tablet Take 1 tablet (1 mg total) by mouth daily. 90 tablet 0  . diphenhydrAMINE (BENADRYL) 25 MG tablet Take 50 mg by mouth at bedtime.    . gabapentin (NEURONTIN) 300 MG capsule Take 1 capsule (300 mg total) by mouth at bedtime. 30 capsule 3  . lidocaine-prilocaine (EMLA) cream Apply to affected area once (Patient taking differently: Apply 1 application topically daily as needed (pain). Apply to affected area once) 30 g 3  . LORazepam (ATIVAN) 0.5 MG tablet Place 1 tablet (0.5 mg total) under the tongue every 6 (six) hours as needed (Nausea and vomiting). 60 tablet 0  . megestrol (MEGACE ES) 625 MG/5ML suspension Take 5 mLs (625 mg total) by mouth daily. 150 mL 0  . ondansetron (ZOFRAN) 8 MG tablet Take 0.5-1 tablets (4-8 mg total) by mouth every 8 (eight) hours as needed for nausea or vomiting. 30 tablet 6  . oxyCODONE (ROXICODONE) 5 MG immediate release tablet Take 1  tablet (5 mg total) by mouth every 4 (four) hours as needed for severe pain. 90 tablet 0  . Tucatinib (TUKYSA) 150 MG TABS Take 300 mg by mouth 2 (two) times a day. 120 tablet 1  . Turmeric 500 MG CAPS Take 500 mg by mouth daily.     No current facility-administered medications for this visit.    Facility-Administered Medications Ordered in Other Visits  Medication Dose Route Frequency Provider Last Rate Last Dose  . heparin lock flush 100 unit/mL  500 Units Intracatheter Once PRN Nicholas Lose, MD      . sodium chloride flush (NS) 0.9 % injection 10 mL  10 mL Intravenous PRN Nicholas Lose, MD   10 mL at 01/24/17 1116  . sodium chloride flush (NS) 0.9 % injection 10 mL  10 mL Intravenous PRN Nicholas Lose, MD   10 mL at 02/14/17 1236  . sodium chloride flush (NS) 0.9 % injection 10 mL  10 mL Intracatheter PRN Nicholas Lose, MD      . trastuzumab-dkst (OGIVRI) 378 mg in sodium chloride 0.9 % 250 mL chemo infusion  6 mg/kg (Treatment Plan Recorded) Intravenous Once Nicholas Lose, MD        PHYSICAL EXAMINATION: ECOG  PERFORMANCE STATUS: 2 - Symptomatic, <50% confined to bed  Vitals:   03/03/19 1101  BP: 128/89  Pulse: 74  Resp: 18  Temp: 98.5 F (36.9 C)  SpO2: 100%   Filed Weights   03/03/19 1101  Weight: 118 lb 14.4 oz (53.9 kg)    GENERAL: alert, no distress and comfortable, appears to be very fatigued and was yawning a lot. SKIN: skin color, texture, turgor are normal, no rashes or significant lesions EYES: normal, Conjunctiva are pink and non-injected, sclera clear OROPHARYNX: no exudate, no erythema and lips, buccal mucosa, and tongue normal  NECK: supple, thyroid normal size, non-tender, without nodularity LYMPH: no palpable lymphadenopathy in the cervical, axillary or inguinal LUNGS: clear to auscultation and percussion with normal breathing effort HEART: regular rate & rhythm and no murmurs and no lower extremity edema ABDOMEN: abdomen soft, non-tender and normal bowel  sounds MUSCULOSKELETAL: no cyanosis of digits and no clubbing  NEURO: alert & oriented x 3 with fluent speech, no focal motor/sensory deficits EXTREMITIES: No lower extremity edema  LABORATORY DATA:  I have reviewed the data as listed CMP Latest Ref Rng & Units 03/03/2019 02/10/2019 01/27/2019  Glucose 70 - 99 mg/dL 95 122(H) 102(H)  BUN 6 - 20 mg/dL _0 Creatinine 0.44 - 1.00 mg/dL 0.78 0.67 0.64  Sodium 135 - 145 mmol/L 137 134(L) 135  Potassium 3.5 - 5.1 mmol/L 3.5 3.8 3.9  Chloride 98 - 111 mmol/L 103 100 100  CO2 22 - 32 mmol/L _1 Calcium 8.9 - 10.3 mg/dL 8.9 8.7(L) 8.4(L)  Total Protein 6.5 - 8.1 g/dL 6.2(L) 6.9 6.7  Total Bilirubin 0.3 - 1.2 mg/dL 0.8 0.5 0.4  Alkaline Phos 38 - 126 U/L 128(H) 99 102  AST 15 - 41 U/L _2 ALT 0 - 44 U/L _3 Lab Results  Component Value Date   WBC 3.7 (L) 03/03/2019   HGB 11.9 (L) 03/03/2019   HCT 35.0 (L) 03/03/2019   MCV 99.7 03/03/2019   PLT 191 03/03/2019   NEUTROABS 2.6 03/03/2019    ASSESSMENT & PLAN:  Malignant neoplasm of upper-inner quadrant of left breast in female, estrogen receptor positive ([place]) Extensive and widespread brain metastases: Patient has extensive visual symptoms are started off fairly mild in January 2020. She started losing peripheral vision and later ability to read. She was also having poor thinking and thought process. She has no motor deficits. Denies any sensory deficits.  Brain MRI 11/27/2018: Extensive widespread brain metastases. Largest tumor in the left occipital lobe 4.5 cm. Several other large tumors noted in multiple areas. Extensive smaller tumors are noted including the cerebellum.  PET/CT 12/05/2018:Widespread hypermetabolic bone metastases throughout the axilla and appendicular skeleton, left femoral neck metastases at risk for pathologic fracture, multiple liver metastases, left adrenal metastases, left supraclavicular lymph node and tiny 3 mm left upper lobe lung  nodule  Liver biopsy 12/12/2018: Metastatic breast cancer ER 100%, PR 0%, Ki-67 50%, HER-2 3+ positive [facility]is molecular testing: ER +95%, HER-2 positive, PR +25%,AR +85%, BRCA negative, PD-L1 +1%,PIK3CA mutationpathogenic variant exon 21, PTEN: No mutation ---------------------------------------------------------------------------------------------------------------------- Current treatment: Completed whole brain radiation, palliative radiation to the left femur Xeloda, Herceptin, Tucatinib(started 12/24/2018)  Intractable pain in hips: Radiation completed, pain medications Palliative care will be reconsulted Cognitive difficulties: Patient is very concerned about her cognitive impairment.  She is worried that this could progress and cause permanent disability.  Plan:3 months of Xeloda with Herceptin and  Tucatinib. Profound nausea and vomiting due to Xeloda: Reduce the Xeloda dose to 2 tablets in the morning 1 tablet in the evening. If the scans after 3 months reveal that her disease is under good control then we can make a decision on stopping Xeloda or reducing the dosage.  Return to clinic in 3 weeks for next treatment with scans done prior to that.  Orders Placed This Encounter  Procedures  . CT Abdomen Pelvis W Contrast    Standing Status:   Future    Standing Expiration Date:   03/02/2020    Order Specific Question:   ** REASON FOR EXAM (FREE TEXT)    Answer:   Metastatic breast cancer restaging    Order Specific Question:   If indicated for the ordered procedure, I authorize the administration of contrast media per Radiology protocol    Answer:   Yes    Order Specific Question:   Is patient pregnant?    Answer:   No    Order Specific Question:   Preferred imaging location?    Answer:   [facility]    Order Specific Question:   Is Oral Contrast requested for this exam?    Answer:   Yes, Per Radiology protocol    Order Specific Question:   Radiology Contrast Protocol  - do NOT remove file path    Answer:   \\[email or web address].pdf  . CT Chest W Contrast    Standing Status:   Future    Standing Expiration Date:   03/02/2020    Order Specific Question:   ** REASON FOR EXAM (FREE TEXT)    Answer:   Met breast cancer restaging    Order Specific Question:   If indicated for the ordered procedure, I authorize the administration of contrast media per Radiology protocol    Answer:   Yes    Order Specific Question:   Is patient pregnant?    Answer:   No    Order Specific Question:   Preferred imaging location?    Answer:   [facility]    Order Specific Question:   Radiology Contrast Protocol - do NOT remove file path    Answer:   \\[email or web address]   The patient has a good understanding of the overall plan. she agrees with it. she will call with any problems that may develop before the next visit here.  Nicholas Lose, MD 03/03/2019  Julious Oka Dorshimer am acting as scribe for Dr. Nicholas Lose.  I have reviewed the above documentation for accuracy and completeness, and I agree with the above.

## 2019-03-03 ENCOUNTER — Other Ambulatory Visit: Payer: Self-pay

## 2019-03-03 ENCOUNTER — Inpatient Hospital Stay (HOSPITAL_BASED_OUTPATIENT_CLINIC_OR_DEPARTMENT_OTHER): Payer: PRIVATE HEALTH INSURANCE | Admitting: Hematology and Oncology

## 2019-03-03 ENCOUNTER — Encounter: Payer: Self-pay | Admitting: *Deleted

## 2019-03-03 ENCOUNTER — Inpatient Hospital Stay: Payer: PRIVATE HEALTH INSURANCE

## 2019-03-03 ENCOUNTER — Inpatient Hospital Stay: Payer: PRIVATE HEALTH INSURANCE | Attending: Hematology and Oncology

## 2019-03-03 DIAGNOSIS — C50212 Malignant neoplasm of upper-inner quadrant of left female breast: Secondary | ICD-10-CM

## 2019-03-03 DIAGNOSIS — R112 Nausea with vomiting, unspecified: Secondary | ICD-10-CM | POA: Insufficient documentation

## 2019-03-03 DIAGNOSIS — C7931 Secondary malignant neoplasm of brain: Secondary | ICD-10-CM | POA: Insufficient documentation

## 2019-03-03 DIAGNOSIS — Z5112 Encounter for antineoplastic immunotherapy: Secondary | ICD-10-CM | POA: Diagnosis not present

## 2019-03-03 DIAGNOSIS — Z170 Estrogen receptor positive status [ER+]: Secondary | ICD-10-CM

## 2019-03-03 DIAGNOSIS — C7972 Secondary malignant neoplasm of left adrenal gland: Secondary | ICD-10-CM | POA: Diagnosis not present

## 2019-03-03 DIAGNOSIS — R4189 Other symptoms and signs involving cognitive functions and awareness: Secondary | ICD-10-CM | POA: Diagnosis not present

## 2019-03-03 DIAGNOSIS — C787 Secondary malignant neoplasm of liver and intrahepatic bile duct: Secondary | ICD-10-CM | POA: Diagnosis not present

## 2019-03-03 DIAGNOSIS — C7951 Secondary malignant neoplasm of bone: Secondary | ICD-10-CM | POA: Diagnosis not present

## 2019-03-03 DIAGNOSIS — Z95828 Presence of other vascular implants and grafts: Secondary | ICD-10-CM

## 2019-03-03 DIAGNOSIS — Z7189 Other specified counseling: Secondary | ICD-10-CM

## 2019-03-03 LAB — CMP (CANCER CENTER ONLY)
ALT: 18 U/L (ref 0–44)
AST: 17 U/L (ref 15–41)
Albumin: 3.9 g/dL (ref 3.5–5.0)
Alkaline Phosphatase: 128 U/L — ABNORMAL HIGH (ref 38–126)
Anion gap: 11 (ref 5–15)
BUN: 10 mg/dL (ref 6–20)
CO2: 23 mmol/L (ref 22–32)
Calcium: 8.9 mg/dL (ref 8.9–10.3)
Chloride: 103 mmol/L (ref 98–111)
Creatinine: 0.78 mg/dL (ref 0.44–1.00)
GFR, Est AFR Am: >60 mL/min (ref >60)
GFR, Estimated: >60 mL/min (ref >60)
Glucose, Bld: 95 mg/dL (ref 70–99)
Potassium: 3.5 mmol/L (ref 3.5–5.1)
Sodium: 137 mmol/L (ref 135–145)
Total Bilirubin: 0.8 mg/dL (ref 0.3–1.2)
Total Protein: 6.2 g/dL — ABNORMAL LOW (ref 6.5–8.1)

## 2019-03-03 LAB — CBC WITH DIFFERENTIAL (CANCER CENTER ONLY)
Abs Immature Granulocytes: 0.03 10*3/uL (ref 0.00–0.07)
Basophils Absolute: 0 10*3/uL (ref 0.0–0.1)
Basophils Relative: 0 %
Eosinophils Absolute: 0.1 10*3/uL (ref 0.0–0.5)
Eosinophils Relative: 1 %
HCT: 35 % — ABNORMAL LOW (ref 36.0–46.0)
Hemoglobin: 11.9 g/dL — ABNORMAL LOW (ref 12.0–15.0)
Immature Granulocytes: 1 %
Lymphocytes Relative: 14 %
Lymphs Abs: 0.5 10*3/uL — ABNORMAL LOW (ref 0.7–4.0)
MCH: 33.9 pg (ref 26.0–34.0)
MCHC: 34 g/dL (ref 30.0–36.0)
MCV: 99.7 fL (ref 80.0–100.0)
Monocytes Absolute: 0.5 10*3/uL (ref 0.1–1.0)
Monocytes Relative: 13 %
Neutro Abs: 2.6 10*3/uL (ref 1.7–7.7)
Neutrophils Relative %: 71 %
Platelet Count: 191 10*3/uL (ref 150–400)
RBC: 3.51 MIL/uL — ABNORMAL LOW (ref 3.87–5.11)
RDW: 17.7 % — ABNORMAL HIGH (ref 11.5–15.5)
WBC Count: 3.7 10*3/uL — ABNORMAL LOW (ref 4.0–10.5)
nRBC: 0 % (ref 0.0–0.2)

## 2019-03-03 MED ORDER — ACETAMINOPHEN 325 MG PO TABS
ORAL_TABLET | ORAL | Status: AC
  Filled 2019-03-03: qty 2

## 2019-03-03 MED ORDER — SODIUM CHLORIDE 0.9% FLUSH
10.0000 mL | INTRAVENOUS | Status: DC | PRN
  Administered 2019-03-03: 10 mL
  Filled 2019-03-03: qty 10

## 2019-03-03 MED ORDER — ACETAMINOPHEN 325 MG PO TABS
650.0000 mg | ORAL_TABLET | Freq: Once | ORAL | Status: AC
  Administered 2019-03-03: 650 mg via ORAL

## 2019-03-03 MED ORDER — HEPARIN SOD (PORK) LOCK FLUSH 100 UNIT/ML IV SOLN
500.0000 [IU] | Freq: Once | INTRAVENOUS | Status: AC | PRN
  Administered 2019-03-03: 500 [IU]
  Filled 2019-03-03: qty 5

## 2019-03-03 MED ORDER — DIPHENHYDRAMINE HCL 25 MG PO CAPS
50.0000 mg | ORAL_CAPSULE | Freq: Once | ORAL | Status: AC
  Administered 2019-03-03: 50 mg via ORAL

## 2019-03-03 MED ORDER — MEGESTROL ACETATE 625 MG/5ML PO SUSP: 625.0000 mg | Freq: Every day | ORAL | 0 refills | Status: AC

## 2019-03-03 MED ORDER — SODIUM CHLORIDE 0.9% FLUSH
10.0000 mL | INTRAVENOUS | Status: DC | PRN
  Administered 2019-03-03: 10 mL via INTRAVENOUS
  Filled 2019-03-03: qty 10

## 2019-03-03 MED ORDER — TRASTUZUMAB-DKST CHEMO 150 MG IV SOLR
300.0000 mg | Freq: Once | INTRAVENOUS | Status: AC
  Administered 2019-03-03: 300 mg via INTRAVENOUS
  Filled 2019-03-03: qty 14.29

## 2019-03-03 MED ORDER — DIPHENHYDRAMINE HCL 25 MG PO CAPS
ORAL_CAPSULE | ORAL | Status: AC
  Filled 2019-03-03: qty 2

## 2019-03-03 MED ORDER — TRASTUZUMAB-DKST CHEMO 150 MG IV SOLR: 6.0000 mg/kg | Freq: Once | INTRAVENOUS | Status: DC

## 2019-03-03 MED ORDER — SODIUM CHLORIDE 0.9 % IV SOLN
Freq: Once | INTRAVENOUS | Status: AC
  Administered 2019-03-03: 12:00:00 via INTRAVENOUS
  Filled 2019-03-03: qty 250

## 2019-03-03 NOTE — Patient Instructions (Signed)
[facility] Discharge Instructions for Patients Receiving Chemotherapy  Today you received the following chemotherapy agents: Ogivri   To help prevent nausea and vomiting after your treatment, we encourage you to take your nausea medication as directed.    If you develop nausea and vomiting that is not controlled by your nausea medication, call the clinic.   BELOW ARE SYMPTOMS THAT SHOULD BE REPORTED IMMEDIATELY:  *FEVER GREATER THAN 100.5 F  *CHILLS WITH OR WITHOUT FEVER  NAUSEA AND VOMITING THAT IS NOT CONTROLLED WITH YOUR NAUSEA MEDICATION  *UNUSUAL SHORTNESS OF BREATH  *UNUSUAL BRUISING OR BLEEDING  TENDERNESS IN MOUTH AND THROAT WITH OR WITHOUT PRESENCE OF ULCERS  *URINARY PROBLEMS  *BOWEL PROBLEMS  UNUSUAL RASH Items with * indicate a potential emergency and should be followed up as soon as possible.  Feel free to call the clinic should you have any questions or concerns. The clinic phone number is [phone].  Please show the CHEMO ALERT CARD at check-in to the Emergency Department and triage nurse.   

## 2019-03-03 NOTE — Progress Notes (Signed)
Okay to treat with Echo from 11/29/2018 per Dr. Lindi Adie.

## 2019-03-03 NOTE — Progress Notes (Signed)
Palliative Care referral initiate per MD request.  Referral information successfully faxed to [phone].

## 2019-03-03 NOTE — Progress Notes (Signed)
Echocardiogram orders placed.  

## 2019-03-04 ENCOUNTER — Telehealth: Payer: Self-pay | Admitting: *Deleted

## 2019-03-04 ENCOUNTER — Telehealth: Payer: Self-pay | Admitting: Hematology and Oncology

## 2019-03-04 NOTE — Telephone Encounter (Signed)
Patient already scheduled to come in on 824

## 2019-03-04 NOTE — Telephone Encounter (Signed)
[facility] faxed Prior authorization request for Megestrol Acetate 625 mg/68ml.  Request to [facility] letter tray receptacle of Prior Authorization forms for review.

## 2019-03-10 ENCOUNTER — Telehealth: Payer: Self-pay | Admitting: *Deleted

## 2019-03-10 ENCOUNTER — Other Ambulatory Visit: Payer: Self-pay

## 2019-03-10 ENCOUNTER — Ambulatory Visit (HOSPITAL_COMMUNITY)
Admission: RE | Admit: 2019-03-10 | Discharge: 2019-03-10 | Disposition: A | Discharge status: Home / Self Care | Payer: PRIVATE HEALTH INSURANCE | Source: Ambulatory Visit | Attending: Hematology and Oncology | Admitting: Hematology and Oncology

## 2019-03-10 DIAGNOSIS — C50212 Malignant neoplasm of upper-inner quadrant of left female breast: Secondary | ICD-10-CM | POA: Diagnosis not present

## 2019-03-10 DIAGNOSIS — Z87891 Personal history of nicotine dependence: Secondary | ICD-10-CM | POA: Diagnosis not present

## 2019-03-10 DIAGNOSIS — Z170 Estrogen receptor positive status [ER+]: Secondary | ICD-10-CM

## 2019-03-10 NOTE — Telephone Encounter (Signed)
Received VM from pt brother Richardson Landry.  Attempt x1 to return call, no answer, LVM

## 2019-03-10 NOTE — Progress Notes (Signed)
  Echocardiogram 2D Echocardiogram has been performed.  Jannett Celestine 03/10/2019, 12:19 PM

## 2019-03-10 NOTE — Telephone Encounter (Signed)
Received call from pt brother Richardson Landry stating that pt is requiring more assistance with ambulation and is not eating as much.  Richardson Landry also states that the family has not heard from palliative care in a while and is wanting to know when they will be in touch with the family.  RN called [facility] palliative care and left a VM to return call to talk about pt care.

## 2019-03-12 ENCOUNTER — Telehealth: Payer: Self-pay | Admitting: Hematology and Oncology

## 2019-03-12 ENCOUNTER — Other Ambulatory Visit: Payer: Self-pay | Admitting: Hematology and Oncology

## 2019-03-12 DIAGNOSIS — R112 Nausea with vomiting, unspecified: Secondary | ICD-10-CM

## 2019-03-12 MED ORDER — LORAZEPAM 0.5 MG PO TABS: 0.5000 mg | ORAL_TABLET | Freq: Four times a day (QID) | ORAL | 3 refills | Status: AC | PRN

## 2019-03-12 NOTE — Progress Notes (Signed)
HEMATOLOGY-ONCOLOGY [place] VISIT PROGRESS NOTE  I connected with Miranda Woods on 03/13/2019 at  2:30 PM EDT by Webex video conference and verified that I am speaking with the correct person using two identifiers.  I discussed the limitations, risks, security and privacy concerns of performing an evaluation and management service by Webex and the availability of in person appointments.  I also discussed with the patient that there may be a patient responsible charge related to this service. The patient expressed understanding and agreed to proceed.  Patient's Location: Home Physician Location: Clinic  CHIEF COMPLIANT: Follow-up of metastatic breast canceron Herceptin and Xeloda  INTERVAL HISTORY: Miranda Woods is a 62 y.o. 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 She is currently on Xeloda, 2 weeks on 1 week off, and Herceptin with Tucatinib every 3 weeks.Echo on 03/10/19 showed an ejection fraction in the range of 60-65%. She presents over Webex today for follow-up.  Patient's family were informing me that she was declining in her performance status.  She is requiring assistance with all activities of daily living.  She is also been having difficulties with speech and being more confused.  She is also been extremely weak and has not been eating or drinking as much.  This is been a cause of a lot of stress between herself and her family.   Oncology History  Malignant neoplasm of upper-inner quadrant of left breast in female, estrogen receptor positive ([place])  09/14/2016 Initial Diagnosis   Palpable left breast mass for 2 months, ultrasound at 9:00: 4.7 x 3.6 x 3.3 cm, axilla negative: U/S Bx: Grade 2 IDC ER 95%, PR 50%, HER-2 positive ratio 6.17, Ki-67 20%, T2 N0 stage IIA clinical stage   09/27/2016 Breast MRI   Left breast: 3.5 cm irregular necrotic mass; left axillary lymph node 1.2 cm; right breast 0.6 cm mass at  6:00 position 0.5 cm mass right breast lower inner quadrant    10/11/2016 - 01/24/2017 Neo-Adjuvant Chemotherapy   Taxotere, Carboplatin, Herceptin, Perjeta x 6 cycles followed by Herceptin and Perjeta maintenance for one year   01/26/2017 Breast MRI   Left breast: Irregular enhancing mass measuring 2.7 cm, smaller than previous, no abnormal lymph nodes; right breast: No mass or enhancement   03/05/2017 Surgery   Left lumpectomy: IDC grade 2, 2.5 cm, margins negative, 0/2 lymph nodes negative, ER 100%, PR 50%, HER-2 positive ratio 4.88, T2 N0 stage IB AJCC 8   04/17/2017 Mammogram   2 groups of suspicious residual calcifications in the left breast spanning 4.8 cm one is anterior to lumpectomy the other is medial aspect of lumpectomy   05/21/2017 - 06/15/2017 Radiation Therapy   Adjuvant radiation therapy   07/11/2017 -  Anti-estrogen oral therapy   Letrozole 2.5 mg daily; Neratinib started 11/28/2017   12/03/2018 - 12/20/2018 Radiation Therapy   Radiation to right hip   12/05/2018 PET scan   Widespread hypermetabolic bone metastases throughout the axilla and appendicular skeleton, left femoral neck metastases at risk for pathologic fracture, multiple liver metastases, left adrenal metastases, left supraclavicular lymph node and tiny 3 mm left upper lobe lung nodule   12/09/2018 -  Chemotherapy   Xeloda, Herceptin, Tucatinib   12/31/2018 Miscellaneous   [facility] molecular testing: ER +95%, HER-2 positive, PR +25%, AR +85%, BRCA negative, PD-L1 +1%,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, PTEN: No mutation     REVIEW OF SYSTEMS:   Constitutional: Denies fevers, chills or abnormal weight loss  Eyes: Denies blurriness of vision Ears, nose, mouth, throat, and face: Denies mucositis or sore throat Respiratory: Denies cough, dyspnea or wheezes Cardiovascular: Denies palpitation, chest discomfort Gastrointestinal:  Denies nausea, heartburn or change in bowel habits Skin: Denies abnormal skin rashes  Lymphatics: Denies new lymphadenopathy or easy bruising Neurological:Denies numbness, tingling or new weaknesses Behavioral/Psych: Mood is stable, no new changes  Extremities: No lower extremity edema Breast: denies any pain or lumps or nodules in either breasts All other systems were reviewed with the patient and are negative.  Observations/Objective:  There were no vitals filed for this visit. There is no height or weight on file to calculate BMI.  I have reviewed the data as listed CMP Latest Ref Rng & Units 03/03/2019 02/10/2019 01/27/2019  Glucose 70 - 99 mg/dL 95 122(H) 102(H)  BUN 6 - 20 mg/dL '10 6 6  '$ Creatinine 0.44 - 1.00 mg/dL 0.78 0.67 0.64  Sodium 135 - 145 mmol/L 137 134(L) 135  Potassium 3.5 - 5.1 mmol/L 3.5 3.8 3.9  Chloride 98 - 111 mmol/L 103 100 100  CO2 22 - 32 mmol/L '23 22 23  '$ Calcium 8.9 - 10.3 mg/dL 8.9 8.7(L) 8.4(L)  Total Protein 6.5 - 8.1 g/dL 6.2(L) 6.9 6.7  Total Bilirubin 0.3 - 1.2 mg/dL 0.8 0.5 0.4  Alkaline Phos 38 - 126 U/L 128(H) 99 102  AST 15 - 41 U/L '17 15 18  '$ ALT 0 - 44 U/L '18 18 18    '$ Lab Results  Component Value Date   WBC 3.7 (L) 03/03/2019   HGB 11.9 (L) 03/03/2019   HCT 35.0 (L) 03/03/2019   MCV 99.7 03/03/2019   PLT 191 03/03/2019   NEUTROABS 2.6 03/03/2019      Assessment Plan:  Malignant neoplasm of upper-inner quadrant of left breast in female, estrogen receptor positive ([place]) Extensive and widespread brain metastases: Patient has extensive visual symptoms are started off fairly mild in January 2020. She started losing peripheral vision and later ability to read. She was also having poor thinking and thought process. She has no motor deficits. Denies any sensory deficits.  Brain MRI 11/27/2018: Extensive widespread brain metastases. Largest tumor in the left occipital lobe 4.5 cm. Several other large tumors noted in multiple areas. Extensive smaller tumors are noted including the cerebellum.  PET/CT 12/05/2018:Widespread  hypermetabolic bone metastases throughout the axilla and appendicular skeleton, left femoral neck metastases at risk for pathologic fracture, multiple liver metastases, left adrenal metastases, left supraclavicular lymph node and tiny 3 mm left upper lobe lung nodule  Liver biopsy 12/12/2018: Metastatic breast cancer ER 100%, PR 0%, Ki-67 50%, HER-2 3+ positive Caris molecular testing: ER +95%, HER-2 positive, PR +25%,AR +85%, BRCA negative, PD-L1 +1%,PIK3CA mutationpathogenic variant exon 21, PTEN: No mutation ---------------------------------------------------------------------------------------------------------------------- Current treatment: Completed whole brain radiation, palliative radiation to the left femur Xeloda, Herceptin, Tucatinib(started 12/24/2018)  Intractable pain in hips: Radiation completed, pain medications  Declining performance status: I discussed with the patient and her family that this does indicate worsening disease and the potential cancer progression.  I discussed the potential that she may not survive very long with rapidly declining oral intake and declining energy.  We will move up her scans to next Monday and will discuss the results. If the scans show progression of disease then I would recommend hospice care for her. Patient needs a full-time nurse's aide.  I discussed with them about paying for the nurses aide at this time and potentially get assistance through hospice care when she gets  transition.  They understand fully that in order to proceed with hospice they would have to stop systemic treatment.  If she does have a good response then we will continue her on Tucatinib and Herceptin.  I discussed the assessment and treatment plan with the patient. The patient was provided an opportunity to ask questions and all were answered. The patient agreed with the plan and demonstrated an understanding of the instructions. The patient was advised to call back or  seek an in-person evaluation if the symptoms worsen or if the condition fails to improve as anticipated.   I provided 30 minutes of face-to-face Web Ex time during this encounter.    Rulon Eisenmenger, MD 03/13/2019

## 2019-03-12 NOTE — Telephone Encounter (Signed)
Called patient regarding upcoming Webex appointment, spoke with patient's relative and they're notified of upcoming Webex appointment. E-mail has been sent.

## 2019-03-13 ENCOUNTER — Inpatient Hospital Stay (HOSPITAL_BASED_OUTPATIENT_CLINIC_OR_DEPARTMENT_OTHER): Payer: PRIVATE HEALTH INSURANCE | Admitting: Hematology and Oncology

## 2019-03-13 DIAGNOSIS — C50212 Malignant neoplasm of upper-inner quadrant of left female breast: Secondary | ICD-10-CM

## 2019-03-13 DIAGNOSIS — Z170 Estrogen receptor positive status [ER+]: Secondary | ICD-10-CM | POA: Diagnosis not present

## 2019-03-13 DIAGNOSIS — Z923 Personal history of irradiation: Secondary | ICD-10-CM | POA: Diagnosis not present

## 2019-03-13 DIAGNOSIS — Z9221 Personal history of antineoplastic chemotherapy: Secondary | ICD-10-CM

## 2019-03-13 DIAGNOSIS — Z79811 Long term (current) use of aromatase inhibitors: Secondary | ICD-10-CM

## 2019-03-13 NOTE — Assessment & Plan Note (Signed)
Extensive and widespread brain metastases: Patient has extensive visual symptoms are started off fairly mild in January 2020. She started losing peripheral vision and later ability to read. She was also having poor thinking and thought process. She has no motor deficits. Denies any sensory deficits.  Brain MRI 11/27/2018: Extensive widespread brain metastases. Largest tumor in the left occipital lobe 4.5 cm. Several other large tumors noted in multiple areas. Extensive smaller tumors are noted including the cerebellum.  PET/CT 12/05/2018:Widespread hypermetabolic bone metastases throughout the axilla and appendicular skeleton, left femoral neck metastases at risk for pathologic fracture, multiple liver metastases, left adrenal metastases, left supraclavicular lymph node and tiny 3 mm left upper lobe lung nodule  Liver biopsy 12/12/2018: Metastatic breast cancer ER 100%, PR 0%, Ki-67 50%, HER-2 3+ positive [facility] molecular testing: ER +95%, HER-2 positive, PR +25%,AR +85%, BRCA negative, PD-L1 +1%,PIK3CA mutationpathogenic variant exon 21, PTEN: No mutation ---------------------------------------------------------------------------------------------------------------------- Current treatment: Completed whole brain radiation, palliative radiation to the left femur Xeloda, Herceptin, Tucatinib(started 12/24/2018)  Intractable pain in hips: Radiation completed, pain medications  Declining performance status: I discussed with the patient and her family that this does indicate worsening disease and the potential cancer progression.  I discussed the potential that she may not survive very long with rapidly declining oral intake and declining energy.  We will move up her scans to next Monday and will discuss the results. If the scans show progression of disease then I would recommend hospice care for her. Patient needs a full-time nurse's aide.  I discussed with them about paying for the nurses  aide at this time and potentially get assistance through hospice care when she gets transition.  They understand fully that in order to proceed with hospice they would have to stop systemic treatment.  If she does have a good response then we will continue her on Tucatinib and Herceptin.

## 2019-03-17 ENCOUNTER — Telehealth: Payer: Self-pay | Admitting: Hematology and Oncology

## 2019-03-17 ENCOUNTER — Ambulatory Visit (HOSPITAL_COMMUNITY)
Admission: RE | Admit: 2019-03-17 | Discharge: 2019-03-17 | Disposition: A | Discharge status: Home / Self Care | Payer: PRIVATE HEALTH INSURANCE | Source: Ambulatory Visit | Attending: Hematology and Oncology | Admitting: Hematology and Oncology

## 2019-03-17 ENCOUNTER — Encounter (HOSPITAL_COMMUNITY): Payer: Self-pay | Admitting: Radiology

## 2019-03-17 ENCOUNTER — Other Ambulatory Visit: Payer: Self-pay

## 2019-03-17 DIAGNOSIS — C50212 Malignant neoplasm of upper-inner quadrant of left female breast: Secondary | ICD-10-CM | POA: Insufficient documentation

## 2019-03-17 DIAGNOSIS — Z170 Estrogen receptor positive status [ER+]: Secondary | ICD-10-CM | POA: Insufficient documentation

## 2019-03-17 MED ORDER — IOHEXOL 300 MG/ML  SOLN
100.0000 mL | Freq: Once | INTRAMUSCULAR | Status: AC | PRN
  Administered 2019-03-17: 100 mL via INTRAVENOUS

## 2019-03-17 MED ORDER — SODIUM CHLORIDE (PF) 0.9 % IJ SOLN
INTRAMUSCULAR | Status: AC
  Filled 2019-03-17: qty 50

## 2019-03-17 NOTE — Progress Notes (Signed)
HEMATOLOGY-ONCOLOGY [place] VISIT PROGRESS NOTE  I connected with Miranda Woods on 03/18/2019 at  2:45 PM EDT by Webex video conference and verified that I am speaking with the correct person using two identifiers.  I discussed the limitations, risks, security and privacy concerns of performing an evaluation and management service by Webex and the availability of in person appointments.  I also discussed with the patient that there may be a patient responsible charge related to this service. The patient expressed understanding and agreed to proceed.  Patient's Location: Home Physician Location: Clinic  CHIEF COMPLIANT: Follow-up of metastatic breast cancerto review scans and discuss direction of therapy  INTERVAL HISTORY: Miranda Woods is a 64 y.o. 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 She is currently on Xeloda, 2 weeks on 1 week off, and Herceptin with Tucatinib every 3 weeks.CT CAP on 03/17/19 showed progression of hepatic and osseous metastases. She presents over Webex today for follow-up to review recent scans and discuss direction of therapy.  Mortimer Fries continues to experience progressive weakness, mental confusion and requires assistance with all activities of daily living.  Her mother spoke to me as well and she reports that she is feeling very weak but reasonably comfortable.  Oncology History  Malignant neoplasm of upper-inner quadrant of left breast in female, estrogen receptor positive ([place])  09/14/2016 Initial Diagnosis   Palpable left breast mass for 2 months, ultrasound at 9:00: 4.7 x 3.6 x 3.3 cm, axilla negative: U/S Bx: Grade 2 IDC ER 95%, PR 50%, HER-2 positive ratio 6.17, Ki-67 20%, T2 N0 stage IIA clinical stage   09/27/2016 Breast MRI   Left breast: 3.5 cm irregular necrotic mass; left axillary lymph node 1.2 cm; right breast 0.6 cm mass at 6:00 position 0.5 cm mass right breast lower inner  quadrant    10/11/2016 - 01/24/2017 Neo-Adjuvant Chemotherapy   Taxotere, Carboplatin, Herceptin, Perjeta x 6 cycles followed by Herceptin and Perjeta maintenance for one year   01/26/2017 Breast MRI   Left breast: Irregular enhancing mass measuring 2.7 cm, smaller than previous, no abnormal lymph nodes; right breast: No mass or enhancement   03/05/2017 Surgery   Left lumpectomy: IDC grade 2, 2.5 cm, margins negative, 0/2 lymph nodes negative, ER 100%, PR 50%, HER-2 positive ratio 4.88, T2 N0 stage IB AJCC 8   04/17/2017 Mammogram   2 groups of suspicious residual calcifications in the left breast spanning 4.8 cm one is anterior to lumpectomy the other is medial aspect of lumpectomy   05/21/2017 - 06/15/2017 Radiation Therapy   Adjuvant radiation therapy   07/11/2017 -  Anti-estrogen oral therapy   Letrozole 2.5 mg daily; Neratinib started 11/28/2017   12/03/2018 - 12/20/2018 Radiation Therapy   Radiation to right hip   12/05/2018 PET scan   Widespread hypermetabolic bone metastases throughout the axilla and appendicular skeleton, left femoral neck metastases at risk for pathologic fracture, multiple liver metastases, left adrenal metastases, left supraclavicular lymph node and tiny 3 mm left upper lobe lung nodule   12/09/2018 -  Chemotherapy   Xeloda, Herceptin, Tucatinib   12/31/2018 Miscellaneous   [facility] molecular testing: ER +95%, HER-2 positive, PR +25%, AR +85%, BRCA negative, PD-L1 +1%,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, PTEN: No mutation     REVIEW OF SYSTEMS:   Severe generalized fatigue and weakness as well as pain related to bone metastases.  Does not appear to be uncomfortable.  She does not answer questions but is able  to smile and nod.  Her brother Richardson Landry and her mom reported that she has profound generalized weakness and she requires help with activities of daily living.  Observations/Objective:  There were no vitals filed for this visit. There is no height or weight on  file to calculate BMI.  I have reviewed the data as listed CMP Latest Ref Rng & Units 03/03/2019 02/10/2019 01/27/2019  Glucose 70 - 99 mg/dL 95 122(H) 102(H)  BUN 6 - 20 mg/dL '10 6 6  '$ Creatinine 0.44 - 1.00 mg/dL 0.78 0.67 0.64  Sodium 135 - 145 mmol/L 137 134(L) 135  Potassium 3.5 - 5.1 mmol/L 3.5 3.8 3.9  Chloride 98 - 111 mmol/L 103 100 100  CO2 22 - 32 mmol/L '23 22 23  '$ Calcium 8.9 - 10.3 mg/dL 8.9 8.7(L) 8.4(L)  Total Protein 6.5 - 8.1 g/dL 6.2(L) 6.9 6.7  Total Bilirubin 0.3 - 1.2 mg/dL 0.8 0.5 0.4  Alkaline Phos 38 - 126 U/L 128(H) 99 102  AST 15 - 41 U/L '17 15 18  '$ ALT 0 - 44 U/L '18 18 18    '$ Lab Results  Component Value Date   WBC 3.7 (L) 03/03/2019   HGB 11.9 (L) 03/03/2019   HCT 35.0 (L) 03/03/2019   MCV 99.7 03/03/2019   PLT 191 03/03/2019   NEUTROABS 2.6 03/03/2019      Assessment Plan:  Malignant neoplasm of upper-inner quadrant of left breast in female, estrogen receptor positive ([place]) Extensive and widespread brain metastases: Patient has extensive visual symptoms are started off fairly mild in January 2020. She started losing peripheral vision and later ability to read. She was also having poor thinking and thought process. She has no motor deficits. Denies any sensory deficits.  Brain MRI 11/27/2018: Extensive widespread brain metastases. Largest tumor in the left occipital lobe 4.5 cm. Several other large tumors noted in multiple areas. Extensive smaller tumors are noted including the cerebellum.  PET/CT 12/05/2018:Widespread hypermetabolic bone metastases throughout the axilla and appendicular skeleton, left femoral neck metastases at risk for pathologic fracture, multiple liver metastases, left adrenal metastases, left supraclavicular lymph node and tiny 3 mm left upper lobe lung nodule  Liver biopsy 12/12/2018: Metastatic breast cancer ER 100%, PR 0%, Ki-67 50%, HER-2 3+ positive [facility]is molecular testing: ER +95%, HER-2 positive, PR +25%,AR +85%, BRCA  negative, PD-L1 +1%,PIK3CA mutationpathogenic variant exon 21, PTEN: No mutation ----------------------------------------------------------------------------------------------------------------------  CT chest abdomen pelvis 03/17/2019: Progression of liver and bone metastases. I discussed the CT scan findings with the patient and her family. Because of progression of disease on current treatment, I recommended that we discontinue it and consider hospice care.  We will consult hospice and discontinue any further appointments in the clinic.    I discussed the assessment and treatment plan with the patient. The patient was provided an opportunity to ask questions and all were answered. The patient agreed with the plan and demonstrated an understanding of the instructions. The patient was advised to call back or seek an in-person evaluation if the symptoms worsen or if the condition fails to improve as anticipated.   I provided 25 minutes of face-to-face Web Ex time during this encounter.    Rulon Eisenmenger, MD 03/18/2019   I, Cloyde Reams Dorshimer, am acting as scribe for Nicholas Lose, MD.  I have reviewed the above documentation for accuracy and completeness, and I agree with the above.

## 2019-03-17 NOTE — Telephone Encounter (Signed)
Called patient regarding upcoming Webex appointment, patient's brother is notified and e-mail has been sent.

## 2019-03-17 NOTE — Assessment & Plan Note (Deleted)
Extensive and widespread brain metastases: Patient has extensive visual symptoms are started off fairly mild in January 2020. She started losing peripheral vision and later ability to read. She was also having poor thinking and thought process. She has no motor deficits. Denies any sensory deficits.  Brain MRI 11/27/2018: Extensive widespread brain metastases. Largest tumor in the left occipital lobe 4.5 cm. Several other large tumors noted in multiple areas. Extensive smaller tumors are noted including the cerebellum.  PET/CT 12/05/2018:Widespread hypermetabolic bone metastases throughout the axilla and appendicular skeleton, left femoral neck metastases at risk for pathologic fracture, multiple liver metastases, left adrenal metastases, left supraclavicular lymph node and tiny 3 mm left upper lobe lung nodule  Liver biopsy 12/12/2018: Metastatic breast cancer ER 100%, PR 0%, Ki-67 50%, HER-2 3+ positive [facility]is molecular testing: ER +95%, HER-2 positive, PR +25%,AR +85%, BRCA negative, PD-L1 +1%,PIK3CA mutationpathogenic variant exon 21, PTEN: No mutation ---------------------------------------------------------------------------------------------------------------------- Current treatment: Completed whole brain radiation, palliative radiation to the left femur Xeloda, Herceptin, Tucatinib(started 12/24/2018)  Intractable pain in hips: Radiation completed, pain medications  Declining performance status: Continue palliative chemo versus hospice CT CAP: 03/17/2019:

## 2019-03-18 ENCOUNTER — Inpatient Hospital Stay (HOSPITAL_BASED_OUTPATIENT_CLINIC_OR_DEPARTMENT_OTHER): Payer: PRIVATE HEALTH INSURANCE | Admitting: Hematology and Oncology

## 2019-03-18 ENCOUNTER — Other Ambulatory Visit: Payer: Self-pay

## 2019-03-18 DIAGNOSIS — Z170 Estrogen receptor positive status [ER+]: Secondary | ICD-10-CM | POA: Diagnosis not present

## 2019-03-18 DIAGNOSIS — C50212 Malignant neoplasm of upper-inner quadrant of left female breast: Secondary | ICD-10-CM

## 2019-03-18 DIAGNOSIS — C7931 Secondary malignant neoplasm of brain: Secondary | ICD-10-CM | POA: Diagnosis not present

## 2019-03-18 DIAGNOSIS — Z9221 Personal history of antineoplastic chemotherapy: Secondary | ICD-10-CM

## 2019-03-18 DIAGNOSIS — C7951 Secondary malignant neoplasm of bone: Secondary | ICD-10-CM

## 2019-03-18 DIAGNOSIS — Z79811 Long term (current) use of aromatase inhibitors: Secondary | ICD-10-CM

## 2019-03-18 DIAGNOSIS — Z923 Personal history of irradiation: Secondary | ICD-10-CM | POA: Diagnosis not present

## 2019-03-18 NOTE — Assessment & Plan Note (Signed)
Extensive and widespread brain metastases: Patient has extensive visual symptoms are started off fairly mild in January 2020. She started losing peripheral vision and later ability to read. She was also having poor thinking and thought process. She has no motor deficits. Denies any sensory deficits.  Brain MRI 11/27/2018: Extensive widespread brain metastases. Largest tumor in the left occipital lobe 4.5 cm. Several other large tumors noted in multiple areas. Extensive smaller tumors are noted including the cerebellum.  PET/CT 12/05/2018:Widespread hypermetabolic bone metastases throughout the axilla and appendicular skeleton, left femoral neck metastases at risk for pathologic fracture, multiple liver metastases, left adrenal metastases, left supraclavicular lymph node and tiny 3 mm left upper lobe lung nodule  Liver biopsy 12/12/2018: Metastatic breast cancer ER 100%, PR 0%, Ki-67 50%, HER-2 3+ positive Caris molecular testing: ER +95%, HER-2 positive, PR +25%,AR +85%, BRCA negative, PD-L1 +1%,PIK3CA mutationpathogenic variant exon 21, PTEN: No mutation ----------------------------------------------------------------------------------------------------------------------  CT chest abdomen pelvis 03/17/2019: Progression of liver and bone metastases. I discussed the CT scan findings with the patient and her family. Because of progression of disease on current treatment, I recommended that we discontinue it and consider hospice care.  We will consult hospice and discontinue any further appointments in the clinic.

## 2019-03-18 NOTE — Progress Notes (Signed)
Referral successfully faxed over to Hospice referrals at [phone].

## 2019-03-19 ENCOUNTER — Telehealth: Payer: Self-pay

## 2019-03-19 NOTE — Telephone Encounter (Signed)
RN returned call to patient's POA, Richardson Landry.  Voicemail left for return call.

## 2019-03-21 ENCOUNTER — Inpatient Hospital Stay (HOSPITAL_COMMUNITY): Admission: RE | Admit: 2019-03-21 | Payer: PRIVATE HEALTH INSURANCE | Source: Ambulatory Visit

## 2019-03-24 ENCOUNTER — Other Ambulatory Visit: Payer: PRIVATE HEALTH INSURANCE

## 2019-03-24 ENCOUNTER — Ambulatory Visit: Payer: PRIVATE HEALTH INSURANCE | Admitting: Hematology and Oncology

## 2019-03-24 ENCOUNTER — Ambulatory Visit: Payer: PRIVATE HEALTH INSURANCE

## 2019-03-27 ENCOUNTER — Telehealth: Payer: Self-pay | Admitting: *Deleted

## 2019-04-01 NOTE — Telephone Encounter (Signed)
FYI "Probation officer from [facility] in [place], [place].  Calling to report Miranda Woods expired on 04-13-19 at 4:58 am.  If you need a call back the number is [phone]."

## 2019-04-01 DEATH — deceased

## 2019-04-14 ENCOUNTER — Ambulatory Visit: Payer: PRIVATE HEALTH INSURANCE | Admitting: Hematology and Oncology

## 2019-04-14 ENCOUNTER — Other Ambulatory Visit: Payer: PRIVATE HEALTH INSURANCE

## 2019-04-14 ENCOUNTER — Ambulatory Visit: Payer: PRIVATE HEALTH INSURANCE

## 2019-06-11 ENCOUNTER — Telehealth: Payer: Self-pay | Admitting: Hematology and Oncology

## 2019-06-11 NOTE — Telephone Encounter (Signed)
FAXED RECORDS TO [facility]

## 2020-07-24 IMAGING — MR MRI HEAD WITHOUT AND WITH CONTRAST
10 of 11 series · 26 of 48 positions shown · IV contrast (Yes)
Comparison: None.

CLINICAL DATA: Breast cancer.  Vision loss.

EXAM:
MRI HEAD WITHOUT AND WITH CONTRAST
TECHNIQUE: Multiplanar, multiecho pulse sequences of the brain and surrounding
structures were obtained without and with intravenous contrast.
CONTRAST:  5 mL Gadovist IV

[Series 3: DWI · axial · 3.0mm · 1.09mm/px · z∈[-5,+153]mm · 7 of 108 slices shown (1 of 2)]
[im 1/108]
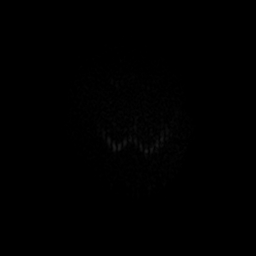
[im 18/108]
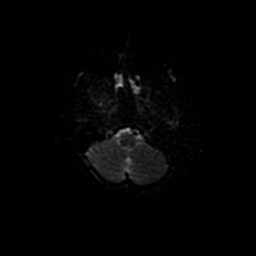
[im 36/108]
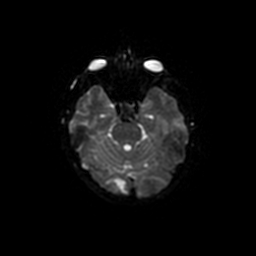
[im 54/108]
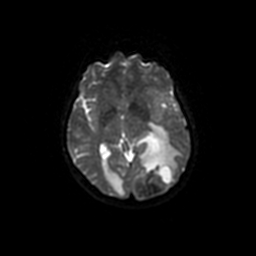
[im 72/108]
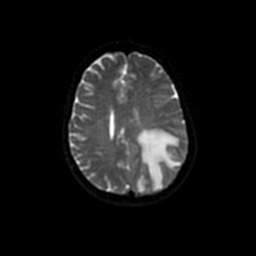
[im 90/108]
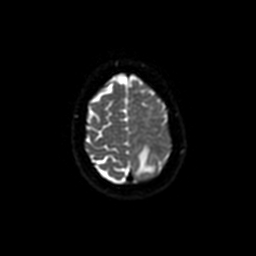
[im 108/108]
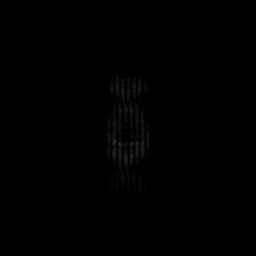

[Series 4: T1 · sagittal · 5.0mm · 0.47mm/px · 1 of 24 slices shown]
[im 1/24]
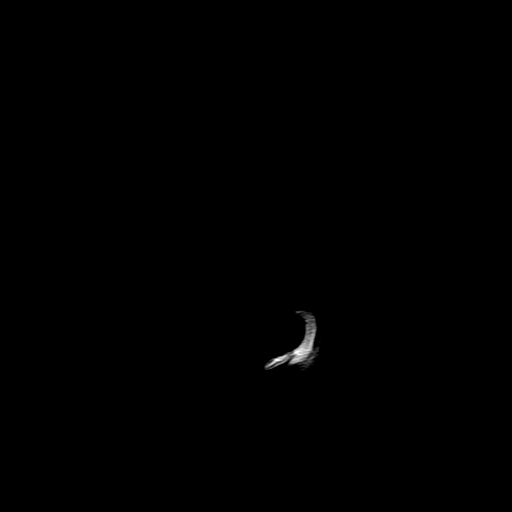

[Series 5: T2 · axial · 5.0mm · 0.43mm/px · z∈[-3,+157]mm · 2 of 24 slices shown]
[im 1/24]
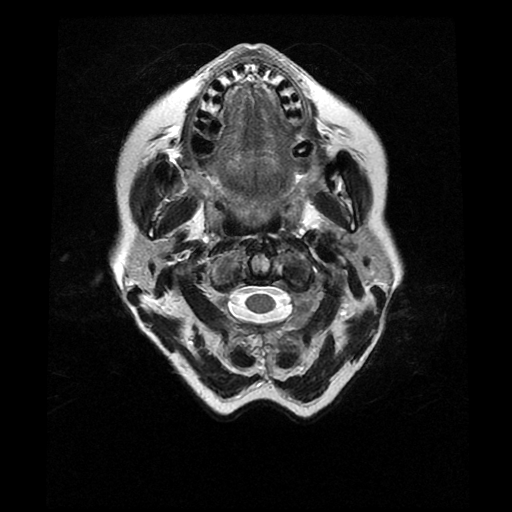
[im 24/24]
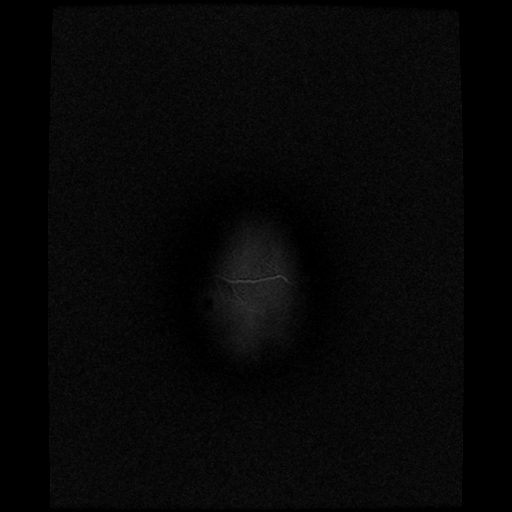

[Series 6: FLAIR · axial · 3.0mm · 0.43mm/px · z∈[+1,+156]mm · 2 of 27 slices shown]
[im 1/27]
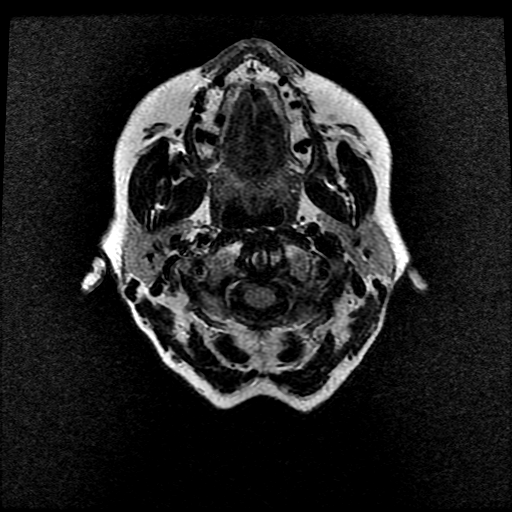
[im 27/27]
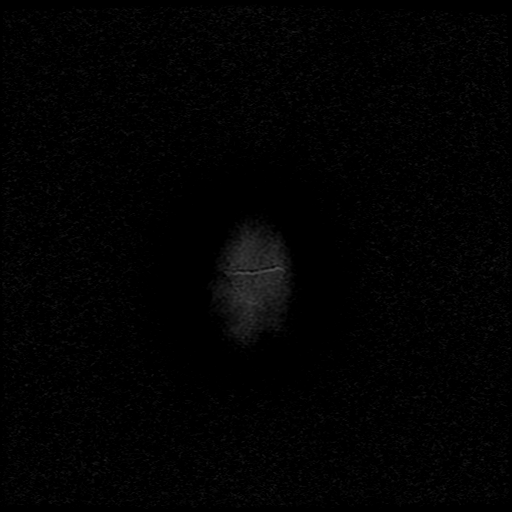

[Series 7: ax mpgr · axial · 3.0mm · 0.43mm/px · z∈[-6,+157]mm · 2 of 34 slices shown]
[im 1/34]
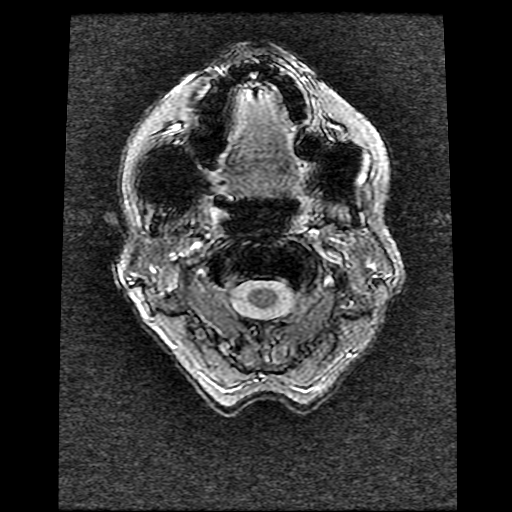
[im 34/34]
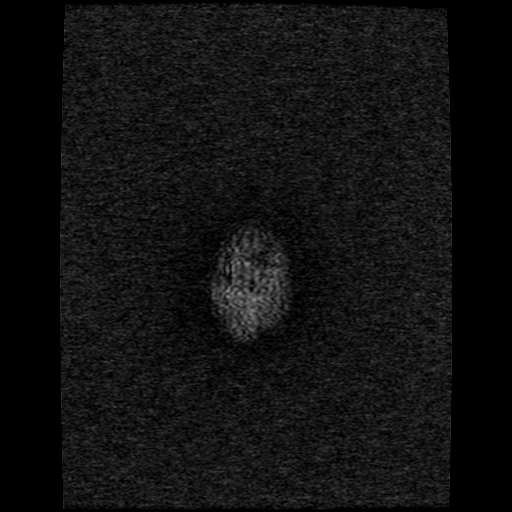

[Series 8: ax fspgr irp · axial · 1.0mm · 0.47mm/px · z∈[-3,+26]mm · 2 of 162 slices shown]
[im 1/162]
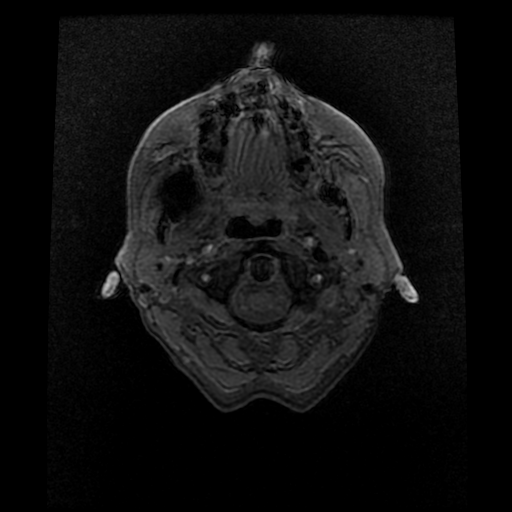
[im 30/162]
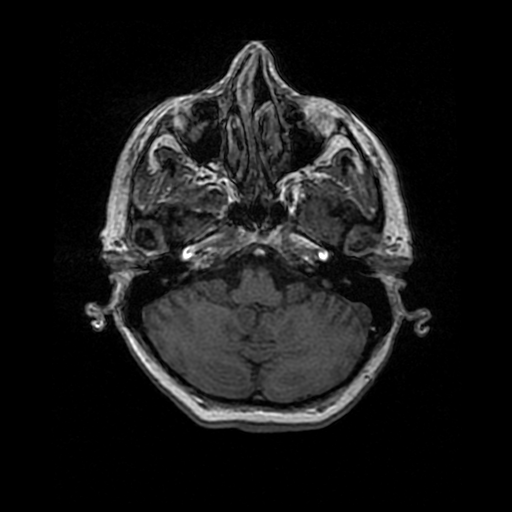

[Series 9: T2 post-contrast · coronal · 5.0mm · 0.45mm/px · 2 of 29 slices shown]
[im 1/29]
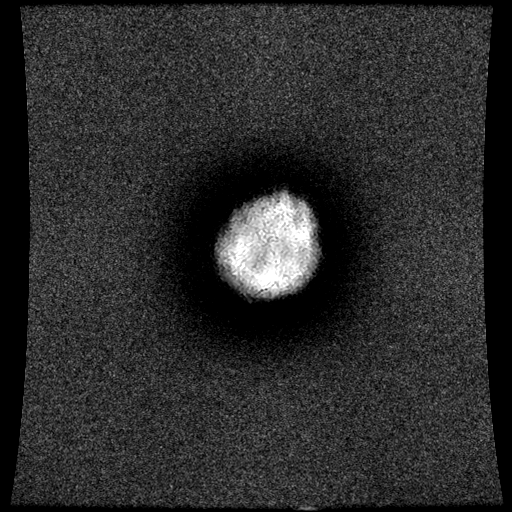
[im 29/29]
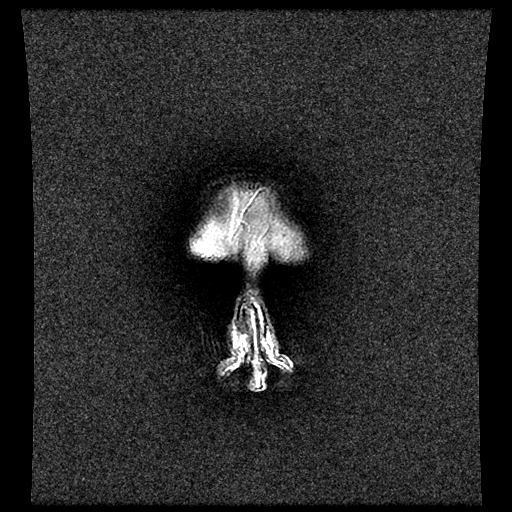

[Series 11: T1 post-contrast · coronal · 5.0mm · 0.43mm/px · 2 of 28 slices shown (1 of 2)]
[im 1/28]
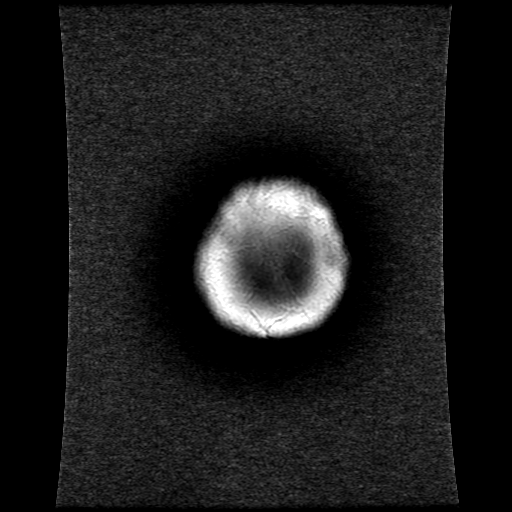
[im 28/28]
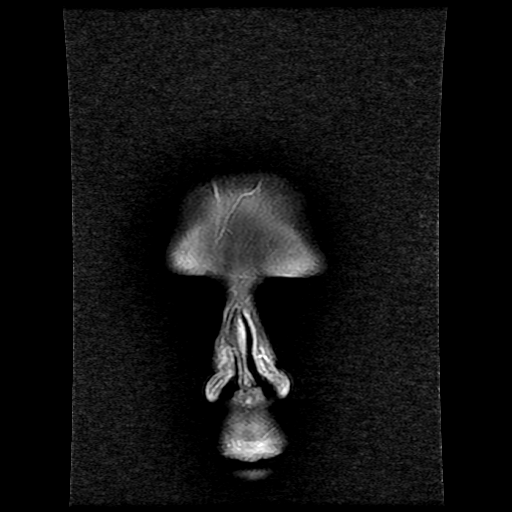

[Series 12: T1 post-contrast · sagittal · 5.0mm · 0.47mm/px · 2 of 24 slices shown (2 of 2)]
[im 1/24]
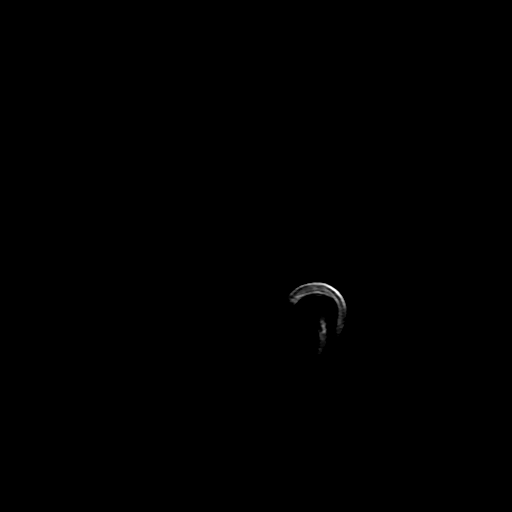
[im 24/24]
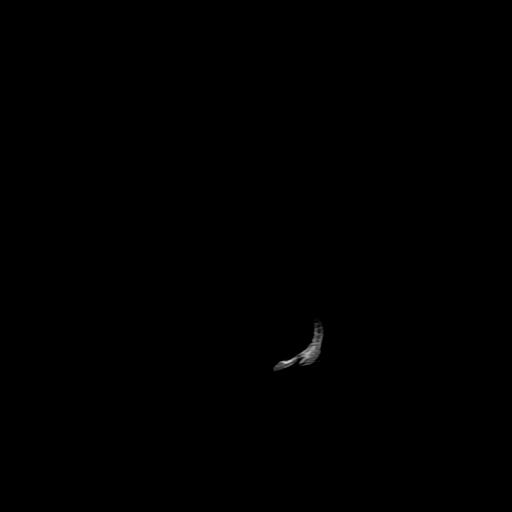

[Series 300: DWI · axial · 3.0mm · 1.09mm/px · z∈[-5,+153]mm · 4 of 54 slices shown (2 of 2)]
[im 1/54]
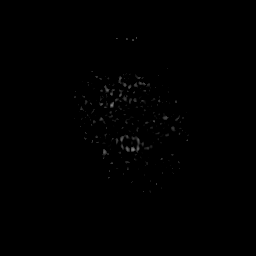
[im 18/54]
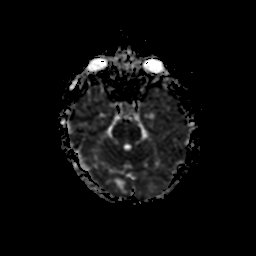
[im 36/54]
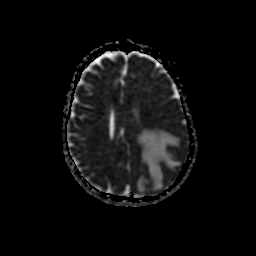
[im 54/54]
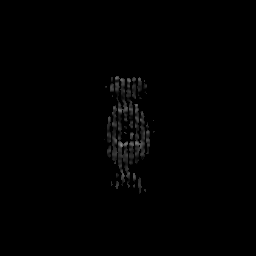

[26 of 48 positions shown; findings below may reference images not displayed]

FINDINGS: Brain: Multiple enhancing brain lesions are present compatible with
widespread metastatic disease. Ventricle size is normal. 9 mm of
midline shift to the right due to extensive edema from the left
occipital lesion.

2 mm enhancing lesion right medial cerebellum

3 mm enhancing lesion right brachium pontis

6 mm lesion right lateral cerebellum has increased signal on T1 and
does not enhance. No surrounding edema. This may be incidental area
of chronic hemorrhage however does not show susceptibility on
gradient echo imaging. Indeterminate lesion.

2 mm enhancing lesion left superior cerebellum

23 mm enhancing lesion right occipital lobe with moderate edema

Large enhancing lesion left occipital lobe measuring approximately
4.5 x 3.6 cm. 2 cm tumor related cyst lateral to the mass. The
enhancement extends anteriorly toward the left occipital horn. There
is extensive surrounding edema with mass-effect. Edema extends into
the left corpus callosum and causes midline shift.

16 mm enhancing lesion right superior temporal lobe

27 x 21 mm enhancing lesion right frontal lobe adjacent to the
frontal horn with protrusion into the right ventricle.

7 mm right medial frontal lobe enhancing lesion

The patient is at risk for CSF seeding given the lesions in the
right frontal lobe and left occipital lobe however no leptomeningeal
abnormal enhancement is identified. Small amounts of hemorrhage are
noted in the occipital lobe lesions bilaterally as well as in the
right frontal lobe lesions and right superior temporal lobe lesion.

Vascular: Normal arterial flow voids

Skull and upper cervical spine: Negative for metastatic disease.
High left parietal bone area of T1 shortening does not enhance and
may be an area of hemangioma or fat in the bone marrow

Sinuses/Orbits: Mild mucosal edema paranasal sinuses.  Normal orbit

Other: None
IMPRESSION: Widespread metastatic disease. Multiple enhancing brain lesions. The
largest lesion in the left occipital lobe has considerable edema and
mass-effect with 9 mm midline shift. Several of the larger lesion
show mild hemorrhage.

These results were called by telephone at the time of interpretation
on 11/27/2018 at [DATE] to Dr. JESIIK IBATA , who verbally
acknowledged these results.

## 2023-06-21 NOTE — Telephone Encounter (Signed)
Telephone call
